# Patient Record
Sex: Female | Born: 1935
Health system: Southern US, Community
[De-identification: ages and names within clinical notes are randomized; demographics above are authoritative.]

## PROBLEM LIST (undated history)

## (undated) DIAGNOSIS — M810 Age-related osteoporosis without current pathological fracture: Secondary | ICD-10-CM

## (undated) DIAGNOSIS — R55 Syncope and collapse: Secondary | ICD-10-CM

## (undated) DIAGNOSIS — K222 Esophageal obstruction: Secondary | ICD-10-CM

## (undated) DIAGNOSIS — K589 Irritable bowel syndrome without diarrhea: Secondary | ICD-10-CM

## (undated) DIAGNOSIS — K219 Gastro-esophageal reflux disease without esophagitis: Secondary | ICD-10-CM

## (undated) DIAGNOSIS — K449 Diaphragmatic hernia without obstruction or gangrene: Secondary | ICD-10-CM

## (undated) DIAGNOSIS — I10 Essential (primary) hypertension: Secondary | ICD-10-CM

## (undated) DIAGNOSIS — C4492 Squamous cell carcinoma of skin, unspecified: Secondary | ICD-10-CM

## (undated) DIAGNOSIS — H3530 Unspecified macular degeneration: Secondary | ICD-10-CM

## (undated) DIAGNOSIS — M8580 Other specified disorders of bone density and structure, unspecified site: Secondary | ICD-10-CM

## (undated) DIAGNOSIS — G901 Familial dysautonomia [Riley-Day]: Secondary | ICD-10-CM

## (undated) DIAGNOSIS — E041 Nontoxic single thyroid nodule: Secondary | ICD-10-CM

## (undated) HISTORY — DX: Nontoxic single thyroid nodule: E04.1

## (undated) HISTORY — DX: Other specified disorders of bone density and structure, unspecified site: M85.80

## (undated) HISTORY — DX: Esophageal obstruction: K22.2

## (undated) HISTORY — DX: Unspecified macular degeneration: H35.30

## (undated) HISTORY — DX: Squamous cell carcinoma of skin, unspecified: C44.92

## (undated) HISTORY — DX: Gastro-esophageal reflux disease without esophagitis: K21.9

## (undated) HISTORY — PX: OTHER SURGICAL HISTORY: SHX169

## (undated) HISTORY — PX: EYE SURGERY: SHX253

## (undated) HISTORY — PX: C-EYE SURGERY PROCEDURE: 102257504

## (undated) HISTORY — DX: Irritable bowel syndrome, unspecified: K58.9

## (undated) HISTORY — PX: ESOPHAGEAL MANOMETRY: SHX1526

## (undated) HISTORY — PX: ESOPHAGEAL DILATION: SHX303

## (undated) HISTORY — PX: ESOPHAGOGASTRODUODENOSCOPY: SHX1529

## (undated) HISTORY — DX: Syncope and collapse: R55

## (undated) HISTORY — DX: Diaphragmatic hernia without obstruction or gangrene: K44.9

## (undated) HISTORY — PX: NISSEN FUNDOPLICATION: SHX2091

## (undated) HISTORY — PX: TOTAL ABDOMINAL HYSTERECTOMY: SHX209

## (undated) HISTORY — DX: Familial dysautonomia (riley-day): G90.1

---

## 1997-12-02 ENCOUNTER — Ambulatory Visit (HOSPITAL_COMMUNITY): Admission: RE | Admit: 1997-12-02 | Discharge: 1997-12-02 | Payer: Self-pay | Admitting: Cardiology

## 1998-10-06 ENCOUNTER — Other Ambulatory Visit: Admission: RE | Admit: 1998-10-06 | Discharge: 1998-10-06 | Payer: Self-pay | Admitting: *Deleted

## 1998-11-06 ENCOUNTER — Encounter: Payer: Self-pay | Admitting: Neurosurgery

## 1998-11-06 ENCOUNTER — Ambulatory Visit (HOSPITAL_COMMUNITY): Admission: RE | Admit: 1998-11-06 | Discharge: 1998-11-06 | Payer: Self-pay | Admitting: Neurosurgery

## 1998-12-08 ENCOUNTER — Ambulatory Visit (HOSPITAL_COMMUNITY): Admission: RE | Admit: 1998-12-08 | Discharge: 1998-12-08 | Payer: Self-pay | Admitting: *Deleted

## 1999-06-21 HISTORY — PX: CATARACT EXTRACTION: SUR2

## 1999-12-16 ENCOUNTER — Ambulatory Visit (HOSPITAL_COMMUNITY): Admission: RE | Admit: 1999-12-16 | Discharge: 1999-12-16 | Payer: Self-pay | Admitting: *Deleted

## 1999-12-16 ENCOUNTER — Encounter: Payer: Self-pay | Admitting: *Deleted

## 2000-07-14 ENCOUNTER — Ambulatory Visit (HOSPITAL_COMMUNITY): Admission: RE | Admit: 2000-07-14 | Discharge: 2000-07-14 | Payer: Self-pay | Admitting: Gastroenterology

## 2000-07-14 ENCOUNTER — Encounter (INDEPENDENT_AMBULATORY_CARE_PROVIDER_SITE_OTHER): Payer: Self-pay | Admitting: Specialist

## 2000-11-07 ENCOUNTER — Other Ambulatory Visit: Admission: RE | Admit: 2000-11-07 | Discharge: 2000-11-07 | Payer: Self-pay | Admitting: *Deleted

## 2000-11-10 ENCOUNTER — Encounter: Admission: RE | Admit: 2000-11-10 | Discharge: 2000-11-10 | Payer: Self-pay | Admitting: Internal Medicine

## 2000-11-10 ENCOUNTER — Encounter: Payer: Self-pay | Admitting: Internal Medicine

## 2001-06-11 ENCOUNTER — Emergency Department (HOSPITAL_COMMUNITY): Admission: EM | Admit: 2001-06-11 | Discharge: 2001-06-11 | Payer: Self-pay | Admitting: Emergency Medicine

## 2001-06-25 ENCOUNTER — Ambulatory Visit (HOSPITAL_COMMUNITY): Admission: RE | Admit: 2001-06-25 | Discharge: 2001-06-25 | Payer: Self-pay | Admitting: Gastroenterology

## 2001-07-03 ENCOUNTER — Emergency Department (HOSPITAL_COMMUNITY): Admission: EM | Admit: 2001-07-03 | Discharge: 2001-07-04 | Payer: Self-pay | Admitting: Emergency Medicine

## 2001-07-13 ENCOUNTER — Ambulatory Visit: Admission: RE | Admit: 2001-07-13 | Discharge: 2001-07-13 | Payer: Self-pay | Admitting: Critical Care Medicine

## 2001-07-19 ENCOUNTER — Encounter: Payer: Self-pay | Admitting: Critical Care Medicine

## 2001-07-19 ENCOUNTER — Ambulatory Visit (HOSPITAL_COMMUNITY): Admission: RE | Admit: 2001-07-19 | Discharge: 2001-07-19 | Payer: Self-pay | Admitting: Critical Care Medicine

## 2001-10-19 ENCOUNTER — Ambulatory Visit (HOSPITAL_COMMUNITY): Admission: RE | Admit: 2001-10-19 | Discharge: 2001-10-19 | Payer: Self-pay | Admitting: Internal Medicine

## 2001-10-19 ENCOUNTER — Encounter: Payer: Self-pay | Admitting: Internal Medicine

## 2001-11-21 ENCOUNTER — Encounter (INDEPENDENT_AMBULATORY_CARE_PROVIDER_SITE_OTHER): Payer: Self-pay | Admitting: *Deleted

## 2001-11-21 ENCOUNTER — Ambulatory Visit (HOSPITAL_COMMUNITY): Admission: RE | Admit: 2001-11-21 | Discharge: 2001-11-21 | Payer: Self-pay | Admitting: Internal Medicine

## 2001-11-21 ENCOUNTER — Encounter: Payer: Self-pay | Admitting: Internal Medicine

## 2002-04-10 ENCOUNTER — Other Ambulatory Visit: Admission: RE | Admit: 2002-04-10 | Discharge: 2002-04-10 | Payer: Self-pay | Admitting: Obstetrics and Gynecology

## 2002-06-21 ENCOUNTER — Encounter: Payer: Self-pay | Admitting: Internal Medicine

## 2002-06-21 ENCOUNTER — Ambulatory Visit (HOSPITAL_COMMUNITY): Admission: RE | Admit: 2002-06-21 | Discharge: 2002-06-21 | Payer: Self-pay | Admitting: Internal Medicine

## 2002-10-04 ENCOUNTER — Encounter: Payer: Self-pay | Admitting: *Deleted

## 2002-10-04 ENCOUNTER — Inpatient Hospital Stay (HOSPITAL_COMMUNITY): Admission: EM | Admit: 2002-10-04 | Discharge: 2002-10-06 | Payer: Self-pay | Admitting: Emergency Medicine

## 2002-10-05 ENCOUNTER — Encounter: Payer: Self-pay | Admitting: Emergency Medicine

## 2004-04-19 ENCOUNTER — Ambulatory Visit (HOSPITAL_COMMUNITY): Admission: RE | Admit: 2004-04-19 | Discharge: 2004-04-19 | Payer: Self-pay | Admitting: Gastroenterology

## 2004-04-26 ENCOUNTER — Ambulatory Visit: Payer: Self-pay | Admitting: Critical Care Medicine

## 2004-05-06 ENCOUNTER — Ambulatory Visit (HOSPITAL_COMMUNITY): Admission: RE | Admit: 2004-05-06 | Discharge: 2004-05-06 | Payer: Self-pay | Admitting: Gastroenterology

## 2004-05-10 ENCOUNTER — Ambulatory Visit: Payer: Self-pay | Admitting: Critical Care Medicine

## 2004-05-18 ENCOUNTER — Ambulatory Visit (HOSPITAL_COMMUNITY): Admission: RE | Admit: 2004-05-18 | Discharge: 2004-05-18 | Payer: Self-pay | Admitting: Gastroenterology

## 2004-06-22 ENCOUNTER — Inpatient Hospital Stay (HOSPITAL_COMMUNITY): Admission: RE | Admit: 2004-06-22 | Discharge: 2004-06-25 | Payer: Self-pay | Admitting: Surgery

## 2005-01-25 ENCOUNTER — Other Ambulatory Visit: Admission: RE | Admit: 2005-01-25 | Discharge: 2005-01-25 | Payer: Self-pay | Admitting: Obstetrics and Gynecology

## 2005-09-30 ENCOUNTER — Ambulatory Visit: Payer: Self-pay | Admitting: Cardiology

## 2005-10-19 ENCOUNTER — Encounter: Payer: Self-pay | Admitting: Internal Medicine

## 2005-10-19 ENCOUNTER — Ambulatory Visit: Payer: Self-pay

## 2006-07-19 ENCOUNTER — Ambulatory Visit: Payer: Self-pay | Admitting: Cardiology

## 2006-10-12 ENCOUNTER — Ambulatory Visit: Payer: Self-pay | Admitting: Cardiology

## 2006-10-19 ENCOUNTER — Ambulatory Visit (HOSPITAL_COMMUNITY): Admission: RE | Admit: 2006-10-19 | Discharge: 2006-10-19 | Payer: Self-pay | Admitting: Gastroenterology

## 2007-06-21 HISTORY — PX: COLONOSCOPY: SHX174

## 2007-08-05 ENCOUNTER — Inpatient Hospital Stay (HOSPITAL_COMMUNITY): Admission: EM | Admit: 2007-08-05 | Discharge: 2007-08-09 | Payer: Self-pay | Admitting: Emergency Medicine

## 2007-08-08 ENCOUNTER — Encounter (INDEPENDENT_AMBULATORY_CARE_PROVIDER_SITE_OTHER): Payer: Self-pay | Admitting: Gastroenterology

## 2007-08-22 ENCOUNTER — Ambulatory Visit (HOSPITAL_COMMUNITY): Admission: RE | Admit: 2007-08-22 | Discharge: 2007-08-23 | Payer: Self-pay | Admitting: Surgery

## 2008-01-28 ENCOUNTER — Ambulatory Visit: Payer: Self-pay | Admitting: Cardiology

## 2009-01-09 ENCOUNTER — Encounter (INDEPENDENT_AMBULATORY_CARE_PROVIDER_SITE_OTHER): Payer: Self-pay | Admitting: *Deleted

## 2009-04-20 ENCOUNTER — Telehealth: Payer: Self-pay | Admitting: Cardiology

## 2009-06-29 ENCOUNTER — Encounter: Payer: Self-pay | Admitting: Internal Medicine

## 2009-07-10 ENCOUNTER — Ambulatory Visit: Payer: Self-pay | Admitting: Internal Medicine

## 2009-07-10 DIAGNOSIS — Q078 Other specified congenital malformations of nervous system: Secondary | ICD-10-CM | POA: Insufficient documentation

## 2009-07-10 DIAGNOSIS — R079 Chest pain, unspecified: Secondary | ICD-10-CM | POA: Insufficient documentation

## 2009-07-10 DIAGNOSIS — R55 Syncope and collapse: Secondary | ICD-10-CM

## 2009-07-10 DIAGNOSIS — R002 Palpitations: Secondary | ICD-10-CM

## 2009-07-10 HISTORY — DX: Chest pain, unspecified: R07.9

## 2009-07-10 HISTORY — DX: Palpitations: R00.2

## 2009-07-10 HISTORY — DX: Syncope and collapse: R55

## 2009-07-22 ENCOUNTER — Telehealth (INDEPENDENT_AMBULATORY_CARE_PROVIDER_SITE_OTHER): Payer: Self-pay

## 2009-07-23 ENCOUNTER — Ambulatory Visit: Payer: Self-pay

## 2009-07-23 ENCOUNTER — Encounter (HOSPITAL_COMMUNITY): Admission: RE | Admit: 2009-07-23 | Discharge: 2009-09-24 | Payer: Self-pay | Admitting: Internal Medicine

## 2009-07-23 ENCOUNTER — Ambulatory Visit: Payer: Self-pay | Admitting: Internal Medicine

## 2009-07-29 ENCOUNTER — Telehealth: Payer: Self-pay | Admitting: Internal Medicine

## 2009-08-26 ENCOUNTER — Telehealth (INDEPENDENT_AMBULATORY_CARE_PROVIDER_SITE_OTHER): Payer: Self-pay | Admitting: *Deleted

## 2009-11-19 ENCOUNTER — Ambulatory Visit: Payer: Self-pay | Admitting: Internal Medicine

## 2010-04-01 ENCOUNTER — Encounter: Admission: RE | Admit: 2010-04-01 | Discharge: 2010-04-01 | Payer: Self-pay | Admitting: Otolaryngology

## 2010-07-22 NOTE — Progress Notes (Signed)
Summary:  Nuc Stress Results   Phone Note Outgoing Call   Call placed by: Duncan Dull, RN, BSN,  August 26, 2009 5:09 PM Call placed to: Patient Summary of Call: No answer, No machine Called to discuss Nuc Stress Results  Initial call taken by: Duncan Dull, RN, BSN,  August 26, 2009 5:10 PM  Follow-up for Phone Call        Called patient and left message on machine  Duncan Dull, RN, BSN  August 27, 2009 9:37 AM   Additional Follow-up for Phone Call Additional follow up Details #1::        Pt aware of Nuc Stress results Additional Follow-up by: Duncan Dull, RN, BSN,  August 27, 2009 11:44 AM

## 2010-07-22 NOTE — Progress Notes (Signed)
Summary:  test results/procedure   Phone Note Call from Patient Call back at Home Phone [phone]   Caller: Patient Reason for Call: Talk to Nurse, Lab or Test Results Summary of Call: request results of stress test..... having a spot on hand removed, does she need to have antibiotics before? please call before 9:30 or after 2:30 Initial call taken by: Migdalia Dk,  July 29, 2009 8:27 AM  Follow-up for Phone Call        Called patient and left message on machine  Duncan Dull, RN, BSN  July 29, 2009 11:29 AM   Pt wanted to know if she needed SBE (per her card) to have a spot removed from her hand. I advised her that does not apply to the hands. She understands.  Follow-up by: Duncan Dull, RN, BSN,  July 29, 2009 2:48 PM

## 2010-07-22 NOTE — Assessment & Plan Note (Signed)
Summary: Cardiology Nuclear Study  Nuclear Med Background Indications for Stress Test: Evaluation for Ischemia     Symptoms: Chest Pressure, Diaphoresis, Light-Headedness, Palpitations, Rapid HR, Syncope  Symptoms Comments: Chest Heaviness   Nuclear Pre-Procedure Caffeine/Decaff Intake: None NPO After: 6:30 PM Lungs: clear IV 0.9% NS with Angio Cath: 22g     IV Site: (R) AC IV Started by: Irean Hong RN Chest Size (in) 32     Cup Size C     Height (in): 65 Weight (lb): 124 BMI: 20.71  Nuclear Med Study 1 or 2 day study:  1 day     Stress Test Type:  Eugenie Birks Reading MD:  Arvilla Meres, MD     Referring MD:  S.Klein Resting Radionuclide:  Technetium 80m Tetrofosmin     Resting Radionuclide Dose:  11.0 mCi  Stress Radionuclide:  Technetium 62m Tetrofosmin     Stress Radionuclide Dose:  33.0 mCi   Stress Protocol   Lexiscan: 0.4 mg   Stress Test Technologist:  Milana Na EMT-P     Nuclear Technologist:  Burna Mortimer Deal RT-N  Rest Procedure  Myocardial perfusion imaging was performed at rest 45 minutes following the intravenous administration of Myoview Technetium 56m Tetrofosmin.  Stress Procedure  The patient received IV Lexiscan 0.4 mg over 15-seconds.  Myoview injected at 30-seconds.  There were no significant changes with infusion.  Quantitative spect images were obtained after a 45 minute delay.  QPS Raw Data Images:  Normal; no motion artifact; normal heart/lung ratio. Stress Images:  There is normal uptake in all areas. Rest Images:  Normal homogeneous uptake in all areas of the myocardium. Subtraction (SDS):  Normal Transient Ischemic Dilatation:  1.06  (Normal <1.22)  Lung/Heart Ratio:  .29  (Normal <0.45)  Quantitative Gated Spect Images QGS EDV:  50 ml QGS ESV:  11 ml QGS EF:  77 % QGS cine images:  Normal  Findings Normal nuclear study      Overall Impression  Exercise Capacity: Lexiscan study with no exercise. ECG Impression: Baseline:  NSR; No significant ST segment change with Lexiscan. Overall Impression: Normal stress nuclear study.  Appended Document: Cardiology Nuclear Study Unable to reach pt.Marland KitchenMarland KitchenMarland KitchenNo answer, No machine  Appended Document: Cardiology Nuclear Study Pt aware of results

## 2010-07-22 NOTE — Assessment & Plan Note (Signed)
Summary: nep for palpitations  Medications Added ATENOLOL 25 MG TABS (ATENOLOL) take 1/2 tablet once daily EVISTA 60 MG TABS (RALOXIFENE HCL) take one tablet once daily AMBIEN 10 MG TABS (ZOLPIDEM TARTRATE) as needed      Allergies Added: ! ASA ! LIDOCAINE ! PCN ! SULFA ! BIAXIN ! PRILOSEC ! EPINEPHRINE ! TETRACYCLINE ! * GLYCOL  Referring Provider:  Perini Primary Provider:  Denyse Dago, MD  CC:  nep for palpitations.  She recently had an episode of syncope.  She was in church and got very hot and passed out.Marland Kitchen  History of Present Illness: Michelle Keith is seen at the request of Dr. Waynard Edwards because of a recent episode of syncope and a prior history of palpitations. the patient has a lifelong history of orthostatic intolerance manifested primarily as nausea and some lightheadedness. It was notably worse following exercise. She has had no syncope however until more recently.  5 years ago the patient underwent Nissen fundoplication. Since that time she has had problems with chronic diarrhea that has been variably voluminous. Her lightheadedness has been worse since then. She has had 2 episodes of syncope in the last 2 years, the first occurred when she had a significant associated gastroenteritis. The more recent one occurred 10 days ago while at church. She became nauseated diaphoretic subtotally extremely pale. She got up to leave the church and then describing unable to see and then collapsed. When she finally awakened she was able with some support to get up and move around and she had significant residual fatigue that lasted initially for hours but then persisted mildly bfor about 10 days.  Her diet is saltdeplete. She is quite fluid replete however and she is aware that when she gets sick that she needs to do things like drink Gatorade.  Surprisingly she does not have shower  orthostatic intolerance or heat intolerance . When she was seen earlier this week   Dr.Perini  noted significant  increase in her heart rate from lying to standing going  from 80-120. He kindly sent as those records  she also has a history of palpitations. . The first is a racing that occurs primarily when she is sick. It is somewhat provoked by being upright. The heart rate with ease has been recorded in the 1:15 to 125 range by her and her husband.  The other episodes are shorter and are described by her husband as a normal beat normal beat normal beats followed by 2-5 rapid beats.  with her episodes of lightheadedness, the dose was decreaesed with worsening of her palpitations.  They are not positional      Current Medications (verified): 1)  Atenolol 25 Mg Tabs (Atenolol) .... Take 1/2 Tablet Once Daily 2)  Vitamin D 1000 Unit Tabs (Cholecalciferol) .... Take One Tablet Once Daily 3)  Evista 60 Mg Tabs (Raloxifene Hcl) .... Take One Tablet Once Daily 4)  Ambien 10 Mg Tabs (Zolpidem Tartrate) .... As Needed  Allergies (verified): 1)  ! Asa 2)  ! Lidocaine 3)  ! Pcn 4)  ! Sulfa 5)  ! Biaxin 6)  ! Prilosec 7)  ! Epinephrine 8)  ! Tetracycline 9)  ! * Glycol  Past History:  Family History: Last updated: 07/09/2009 Significant for multiple members having esophageal problems with motility  Social History: Last updated: 07/09/2009 She is a previous substitute Sales executive Married  Tobacco Use - No.  Alcohol Use - no Drug Use - no  Past Medical History:  Vasovagal syncope-hx  Gastroesophageal reflux, stable  Palpitations  Anxiety  Past Surgical History: Status post Nissen fundoplasty in January of 2006.   Review of Systems       full review of systems was negative apart from a history of present illness and past medical history. \par Vital Signs:  Patient profile:   77 year old female Height:      65 inches Weight:      127 pounds BMI:     21.21 Pulse rate:   86 / minute Pulse (ortho):   99 / minute Pulse rhythm:   regular BP sitting:   126 / 80  (left arm) BP  standing:   136 / 79 Cuff size:   regular  Vitals Entered By: Judithe Modest CMA (July 10, 2009 11:11 AM)  Serial Vital Signs/Assessments:  Time      Position  BP       Pulse  Resp  Temp     By 12:36 PM  Lying RA  138/76   79                    Judithe Modest CMA 12:36 PM  Sitting   133/77   86                    Judithe Modest CMA 12:36 PM  Standing  136/79   99                    Judithe Modest CMA  Comments: 12:36 PM 2 minutes-126/85 HR 100 3 minutes-116/77 HR 110 Pts hands, ankles and feet began to turn purple and red and the patient stated her hands felt 'full or swollen'   She had no ches discomfort or dizziness. By: Judithe Modest CMA    Physical Exam  General:  Alert and oriented elderly Caucasian female appearing her stated age of 15 in no acute distress. HEENT  normal . Neck veins were flat; carotids brisk and full without bruits. No lymphadenopathy. Back without kyphosis. Lungs clear. Heart sounds regular without murmurs or gallops. PMI nondisplaced. Abdomen soft with active bowel sounds without midline pulsation or hepatomegaly. Femoral pulses and distal pulses intact. Extremities were without clubbing cyanosis or edemaSkin warm and dry. Neurological exam grossly normal affect was normal   EKG  Procedure date:  07/10/2009  Findings:      sinus rhythm at 86 Intervals 0.14/0.08/0.37 Axis XV  Impression & Recommendations:  Problem # 1:  SYNCOPE (ICD-780.2) this is a manifestation of neurally mediated syncope. The precise trigger is not clear. She has long-standing symptoms consistent with dysautonomia. The Nissen fundoplication in some patients is associated with aggravation or precipitation ofdysautonomic events in this case with her problems with diarrhea, may be contributing.  In any case volume expansion is the primary goal of therapy. Vital signs today and before are consistent with orthostatic intolerance of various degrees. She has some degree of resting  supine hypertension today so we will have to be careful of the impact of salt. Nonmedicinal therapies and/or Mestinon might be important to consider if her supine hypertension worsens with volume and salt repletion.  The other issue is whether chest discomfort is a reflection of inferior ischemia which may by itself trigger a Bezoald-jarisch reflex. As such though I think it is unlikely given the paucity of cardiac risk factors, at this stage it becomes important to exclude ischemia and will undertake a Myoview scan to do that. The patient  is quite averse to walk on her treadmill. Her updated medication list for this problem includes:    Atenolol 25 Mg Tabs (Atenolol) .Marland Kitchen... Take 1/2 tablet once daily  Problem # 2:  CHEST PAIN, EXERTIONAL (ICD-786.50) as above Her updated medication list for this problem includes:    Atenolol 25 Mg Tabs (Atenolol) .Marland Kitchen... Take 1/2 tablet once daily  Problem # 3:  PALPITATIONS (ICD-785.1) I not sure what underlies or palpitations. I suspect short runs are atrial tachycardia. This could be documented work we can continue empiric therapy which has been successful thus far. I would favor the latter. However, the atenolol may aggravate her orthostatic hypotension and as such we will have to be careful that we can treat that other ways i.e. salt and water repletion. In the event that we cannot non blood pressure lowering medications for example an antiarrhythmic drug or digoxin might be more effective here; or maybe just better tolerated.  The more rapid rates I suspect are orthostatic sinus tachycardia and hopefully they will resolve as we work on the aforementioned problems. Her updated medication list for this problem includes:    Atenolol 25 Mg Tabs (Atenolol) .Marland Kitchen... Take 1/2 tablet once daily  Orders: EKG w/ Interpretation (93000) Nuclear Stress Test (Nuc Stress Test)  Patient Instructions: 1)  Your physician has requested that you have an adenosine myoview.  For  further information please visit [email or web address]  Please follow instruction sheet, as given.  Appended Document: nep for palpitations F/U IN 3-4 MONTHS

## 2010-07-22 NOTE — Assessment & Plan Note (Signed)
Summary: pc2      Allergies Added:   Referring Provider:  Perini Primary Provider:  Denyse Dago, MD  CC:  pt reports several more episodes of sweating and feeling dizzy.  Pt not feeling well today.  She describes it as her "thermostat" going haywire.  She gets very hot and feels horrible.  Marland Kitchen  History of Present Illness: Michelle Keith is seen in [place] for orthoastatic and sysautonomic syncope with palpitations assoc wtih some atrial rrrhtymias    DShe had a recent myoview to exclude a Bezold Jarisch reflex as a trigger,  this ws normal  She ahs had two depisodes of dizziness one of which occured on mohters day at an [employer] following a lunch and shopping.  It was associated with heat and diaphoresis and resolved with time   she noted her HR today wsa 120 at home with BP 107  She has been taking more salt and water andher symptoms overlall have been much improved   Current Medications (verified): 1)  Vitamin D 1000 Unit Tabs (Cholecalciferol) .... Take One Tablet Once Daily 2)  Evista 60 Mg Tabs (Raloxifene Hcl) .... Take One Tablet Once Daily 3)  Ambien 10 Mg Tabs (Zolpidem Tartrate) .... As Needed  Allergies (verified): 1)  ! Asa 2)  ! Lidocaine 3)  ! Pcn 4)  ! Sulfa 5)  ! Biaxin 6)  ! Prilosec 7)  ! Epinephrine 8)  ! Tetracycline 9)  ! * Glycol  Past History:  Past Medical History: Last updated: 07/10/2009  Vasovagal syncope-hx  Gastroesophageal reflux, stable  Palpitations  Anxiety  Past Surgical History: Last updated: 07/10/2009 Status post Nissen fundoplasty in January of 2006.   Family History: Last updated: 07/09/2009 Significant for multiple members having esophageal problems with motility  Social History: Last updated: 07/09/2009 She is a previous substitute Sales executive Married  Tobacco Use - No.  Alcohol Use - no Drug Use - no  Vital Signs:  Patient profile:   77 year old female Height:      65  inches Weight:      125 pounds BMI:     20.88 Pulse rhythm:   regular Cuff size:   regular  Vitals Entered By: Judithe Modest CMA (November 19, 2009 11:26 AM)  Physical Exam  General:  The patient was alert and oriented in no acute distress. HEENT Normal.  Neck veins were flat, carotids were brisk.  Lungs were clear.  Heart sounds were regular without murmurs or gallops.  Abdomen was soft with active bowel sounds. There is no clubbing cyanosis or edema. Skin Warm and dry    Impression & Recommendations:  Problem # 1:  DYSAUTONOMIA (ICD-742.8) i htink this is mostlydusautomoia as when she felelslike this her HR is almost always fast.  she is adivised to keep fludi resusicitatied  we spent more thatn 50% of our 35 min  visit discussing ht ephysiologyt and the therapies of her dysautonomia  Patient Instructions: 1)  Your physician recommends that you schedule a follow-up appointment in: as needed.

## 2010-07-22 NOTE — Progress Notes (Signed)
Summary: Nuc. Pre-Procedure  Phone Note Outgoing Call Call back at [facility] Phone [phone]   Call placed by: Irean Hong, RN,  July 22, 2009 2:46 PM Summary of Call: Reviewed information on Myoview Information Sheet (see scanned document for further details).  Spoke with patient.     Nuclear Med Background Indications for Stress Test: Evaluation for Ischemia     Symptoms: Diaphoresis, Light-Headedness, Palpitations, Rapid HR, Syncope    Nuclear Pre-Procedure Height (in): 65

## 2010-11-02 NOTE — Op Note (Signed)
NAME:  Michelle Keith, KOOP NO.:  [id number]   MEDICAL RECORD NO.:  [id number]          PATIENT TYPE:  INP   LOCATION:  1506                         FACILITY:  [facility]   PHYSICIAN:  James L. Malon Kindle., M.D.DATE OF BIRTH:  08-Aug-1931   DATE OF PROCEDURE:  08/08/2007  DATE OF DISCHARGE:                               OPERATIVE REPORT   PROCEDURE:  Colonoscopy and biopsy.   MEDICATIONS:  Fentanyl 100 mcg and Versed 7 mg IV.   INDICATION:  A patient with diarrhea, has had very explosive episodes,  has had previous extensive hiatal hernia repair and Nissen.  X-ray  suggesting possible small bowel obstruction.  This is done to rule out  other causes for diarrhea and look for any signs of colonic obstruction.   DESCRIPTION OF PROCEDURE:  The procedure had been explained to the  patient and consent obtained.  She had been prepped with magnesium  citrate and refused enemas or Golytely.  The Pentax pediatric scope was  used. The scope was inserted. The prep was excellent.  She had a  somewhat tortuous sigmoid.  After we passed through, we advanced rapidly  to the cecum.  The ileocecal valve and appendiceal orifice was seen.  The scope was withdrawn.  The cecum, ascending, transverse, descending,  and sigmoid colon were seen.  No diverticulosis, polyps, cancers, or any  signs of obstruction, narrowing, etc.  The rectum was normal, as well.  Multiple random biopsies were taken throughout.  The scope was  withdrawn.  The patient tolerated the procedure well.   ASSESSMENT:  Diarrhea and abnormal CT with no obvious abnormalities on  colonoscopy, biopsies taken to rule out microscopic colitis.   PLAN:  Will check path and discuss further with Dr. Daphine Deutscher.           ______________________________  Llana Aliment. Malon Kindle., M.D.     Waldron Session  D:  08/08/2007  T:  08/08/2007  Job:  [id number]   cc:   [place] Daphine Deutscher, MD  [address], [address]  [place]  [place] [address]   Redge Gainer. Perini, M.D.  Fax: [phone]

## 2010-11-02 NOTE — Consult Note (Signed)
NAME:  Michelle Keith, Michelle Keith NO.:  [id number]   MEDICAL RECORD NO.:  [id number]          PATIENT TYPE:  INP   LOCATION:  1343                         FACILITY:  [facility]   PHYSICIAN:  Ardeth Sportsman, MD     DATE OF BIRTH:  1932/01/16   DATE OF CONSULTATION:  DATE OF DISCHARGE:                                 CONSULTATION   PRIMARY CARE PHYSICIAN:  Dr. Rodrigo Ran.   REQUESTING PHYSICIAN:  Dr. Larina Earthly.   GASTROENTEROLOGIST:  Bernette Redbird with [facility].   SURGEON:  Estelle Grumbles for [facility].   REASON FOR CONSULT:  Abdominal pain and bloating status post  laparoscopic and paraesophageal hernia repair, question bowel  obstruction.   HISTORY OF PRESENT ILLNESS:  Michelle Keith is a 77 year old female who  underwent laparoscopic paraesophageal hernia repair on June 22, 2004.  Since that time, she notes that she gets intermittent episodes of severe  diarrhea.  She thinks it happens 3 to 10 times a month.  She can have up  to 10 bowel movements.  It often seems to be related to after eating but  sometimes it is not.  She cannot recall which foods make it better or  worse.  She claims that she has been evaluated by Dr. Matthias Hughs with the  [facility] since then, as well as Dr. Daphine Deutscher and actually went to [facility]  although she cannot recall who it was (? gastroenterologist, ? surgeon).  She says she claims that everything has been done and nothing has  worked.   She comes in today because she had some worsening abdominal pain and  distention yesterday and actually felt a little lightheaded.  She went  to the emergency room and actually passed out.  She had an evaluation  that was concerning for a possible small bowel obstruction and therefore  was admitted by Dr. Felipa Eth.  Surgical consultation was requested given the  obstructive symptoms and history of prior surgery.   She denies any sick contacts or travel history.  She claims she has  tried fiber without any  improvement in the diarrhea.  She claims that  she tried Questran without any improvement.  She cannot recall any other  interventions.  She recalls at 1 point talking about being told that she  needed to have reoperation.  She recalls being told at one point  somebody was going to try Botox on her.  It is not coming together.   She claims she has never had any problems with any bowel habits  beforehand but a note from Dr. Matthias Hughs notes that she had chronic  intermittent diarrhea suggestive of irritable bowel syndrome back in  2002.  She flatly denies this and at that point she had a normal  colonoscopy.  A recommendation was made for a flex sig in 5 years and  she has not had any evaluation since that time.  She denies any  hematochezia or melena.  Again, no sick contacts or travel history.   PAST MEDICAL HISTORY:  1. Paraesophageal hernia status post repair.  2. Intermittent  chest discomfort, evaluated by Juanito Doom earlier this      year.  3. Small perimembranous cardiac septal defect, asymptomatic.  4. Intermittent palpitations, controlled with beta-blocker.  5. Intermittent chest pain with negative stress Myoview in the past.  6. Dyslipidemia.  7. She has been followed by Dr. Shan Levans for vocal cord      dysfunction, hoarseness in the past.  8. Distant history of breast cancer.   PAST SURGICAL HISTORY:  1. She had abdominal hysterectomy in the past.  2. She had laparoscopic x3 paraesophageal hernia repair with primary      reconstruction or pledgets and Nissen fundoplication with a #50-      bougie by [facility] on June 22, 2004.   MEDICATIONS:  She claims she takes:  1. Protonix.  2. As well as calcium.  3. Atenolol.   ALLERGIES:  1. ASPIRIN.  2. LIDOCAINE.   SOCIAL HISTORY:  She is married and her husband is here at the bedside.  [place]  Works at home.  No tobacco, alcohol,  or drug use.   FAMILY HISTORY:  Noncontributory for  any major bowel disorders that she  can recall or any early cardiopulmonary disease.   REVIEW OF SYSTEMS:  As noted per HPI.  CONSTITUTIONAL:  No fevers,  chills, or sweats.  No recent weight gain or weight loss.  EYES/ENT:  Negative.  CARDIAC/RESPIRATORY:  She has good physical activity and exercise  tolerance, otherwise completely negative.  GI:  As noted above.  She is  able to belch but no emesis.  No history of dry retching.  No dysphagia  to solids or liquids.  She complained of some mild intermittent reflux  which is controlled on PPI so that it has been mild and she cannot  exactly quantify.  No significant early satiety but occasional bloating.  GU/GYN/HEPATIC/RENAL/ENDOCRINE:  Otherwise negative.  MUSCULOSKELETAL/NEUROLOGICAL/DERMATOLOGIC/LYMPH/ALLERGIC:  Otherwise  negative.   VITAL SIGNS:  Her temperature is 93.  Pulse 82.  Respirations 18.  Blood  pressure 120/70.  She is 98% on room air.  GENERAL:  She is a well-developed, well-nourished female sitting in bed  comfortable in no acute distress.  She is not toxic.  PSYCH:  She is pleasant and interactive.  No strong evidence of any  dementia, delirium, psychosis, or paranoia.  EYES:  Pupils equal, round, and reactive to light.  Extraocular  movements are intact.  Her sclerae are nonicteric or injected.  NEUROLOGICAL:  Cranial nerves II-XII are intact.  Hand grip is 5/5,  equal and symmetrical.  No resting or intention tremors.  NECK:  Supple.  No masses.  Trachea is midline.  HEENT:  She is normocephalic.  Mucous membranes are dry but nasopharynx  and oropharynx are clear.  No facial asymmetry.  CHEST:  Clear to auscultation bilaterally.  No wheezes, rales, or  rhonchi.  HEART:  Regular rate and rhythm.  No murmurs, gallops, or rubs.  ABDOMEN:  Soft, nontender, and nondistended.  No incisional or inguinal  hernias.  No incisional or abdominal hernias.  GENITAL:  Normal external female genitalia with no inguinal  hernias.  RECTAL:  Deferred per patient request.  EXTREMITIES:  No clubbing, cyanosis, or edema.  MUSCULOSKELETAL:  Normal range of motion in the shoulders, elbows,  wrists, as well as hips, knees, and ankles and cervical, thoracolumbar  sacral spine.  BACK:  No pain on palpation of her spine or costophrenic angles.  LYMPH:  No head, neck,  axillary, or groin lymphadenopathy.  BREASTS:  Deferred per patient request.   STUDIES:  White count is 9.9, the normal range, hemoglobin 13.9.  Her  electrolytes are normal except for BUN of 23, slightly elevated.  She  has a urinalysis negative.  Upper GI in May 2008 shows no evidence of  any reflux or any other abnormalities.  CT scan shows that there is no  evidence of any reherniation.  She does have some mildly dilated loops  of small bowel in the left upper quadrant but no definite transition  zone to my view.  She has gas in her colon or just small bowel slightly  decompressed.   ASSESSMENT AND PLAN:  1. Ileus versus partial small bowel obstruction, possibly secondary      adhesions from prior surgery.  2. Keep n.p.o..  3. Increase IV fluid boluses especially with passing out with probable      orthostatics.  4. Nasogastric tube if she has worsening distention or discomfort.  5. Intermittent diarrhea.  I have documentation saying she has had      chronic intermittent diarrhea prior to surgery although she denies      it right now.  I called Dr. Herbert Moors on call for [facility]      [facility] who asked Dr. Matthias Hughs or one of his associates to      see tomorrow and look at prior records to see what workup has been      done and how long this has been going on.  Certainly, her GI      function is not adequately controlled as it is and it would be nice      to figure out what has been done to sort out what is going on with      that.  6. If her symptoms worsen, a diagnostic laparoscopy might be of      benefit but I would hold off right  now since she is not toxic, her      abdomen is flat, and she is actually hungry.  7. Defer rest of management to the medicine team as far as her      palpitations, on beta-blocker.  8. She continues on proton pump inhibitors.  I do not know if she      really needs this anymore.  I would go so far as to do a 24-hour pH      probe or a 48-hour Bravo study off proton pump inhibitors to see if      she has true reflux.  If she does not, she needs to get off it.  9. Consider repeat colonoscopy with [place] and she is overdue for this      anyway with her history of intermittent diarrhea to make sure we      are not missing anything.  I will defer back to gastroenterology on      this.      Ardeth Sportsman, MD  Electronically Signed     SCG/MEDQ  D:  08/05/2007  T:  08/06/2007  Job:  [id number]

## 2010-11-02 NOTE — Assessment & Plan Note (Signed)
[facility]                            CARDIOLOGY OFFICE NOTE   Michelle Keith, Michelle Keith                        MRN:          [id number]  DATE:01/28/2008                            DOB:          08-18-1931    Michelle Keith returns today for followup.  She has been having some  palpitations since the return.  I saw her last on October 12, 2006, when  she was having some chest discomfort.  We had ordered an adenosine rest  stress Myoview, but she chickened out.   Other than some palpitations particularly at night, she is doing well.  She has had no presyncope or syncope.  She has had no chest discomfort.   She still on atenolol 25 mg a day.   PHYSICAL EXAMINATION:  VITAL SIGNS:  Today, her blood pressure is  112/62, her pulse is 80 and regular, and weight is 226, down 2.  GENERAL:  She looks remarkably good.  HEENT:  Unchanged.  NECK:  Carotids are full without bruits.  There is no thyroid  enlargement or tenderness.  LUNGS:  Clear to auscultation.  HEART:  Regular rate and rhythm.  No gallop.  ABDOMEN:  Soft, good bowel sounds.  EXTREMITIES:  No cyanosis, clubbing or edema.  Pulses were present.  NEURO:  Intact.   EKG shows sinus rhythm.  Normal EKG.   Michelle Keith is doing well.  I have reassured her and renewed her atenolol.  We will see her back again in a year.     Thomas C. Daleen Squibb, MD, [facility]  Electronically Signed    TCW/MedQ  DD: 01/28/2008  DT: 01/29/2008  Job #: [id number]

## 2010-11-02 NOTE — Consult Note (Signed)
NAME:  Michelle Keith, Michelle Keith NO.:  [id number]   MEDICAL RECORD NO.:  [id number]          PATIENT TYPE:  INP   LOCATION:  1506                         FACILITY:  [facility]   PHYSICIAN:  James L. Malon Kindle., M.D.DATE OF BIRTH:  07-Aug-1931   DATE OF CONSULTATION:  08/06/2007  DATE OF DISCHARGE:                                 CONSULTATION   REFERRING PHYSICIAN:  Ardeth Sportsman, MD.   HISTORY:  Ms. Stinnette is a nice 77 year old woman who has a long history of  diarrhea and abdominal pain.  These symptoms go back to an extensive  operation in 2005, that was laparoscopic Nissen with repair of the  intrathoracic stomach, by Dr. Molli Hazard B. Daphine Deutscher.  It was called a large  type 3 mixed hiatal hernia.  She had the laparoscopic takedown of an  intrathoracic stomach, repair of the diaphragm, and a Nissen  fundoplication.  Ever since that surgery and beginning approximately a  week or so afterwards, she has had episodes of pain and diarrhea.  I do  not have her office chart available at this time, but she has had an  extensive workup for this by Dr. Matthias Hughs.  She had previous endoscopies  and colonoscopies prior to that surgery in 2006.   She states that these are spells of bloating, some belching actually,  and abdominal cramping and pain; followed by explosive diarrhea with  multiple loose stools and lots of gas and then her bloating subsides.  This occurs approximately 40% of the time, 60% of the days out of the  month she has normal bowel movements once a day after eating breakfast  and pudding-like; with no associated pain or bloating.  Some some months  it is worse than others.  This has been on been on fairly consistently  for several years following her surgery.  She called, was having a  particularly bad episode of pain and bloating; came into the emergency  room.  A CT scan showed a dilated proximal small bowel, and it was felt  that she had a proximal small bowel obstruction.   She had some dilated  loops of jejunum.  She clinically feels better today without having  anything done other than IV fluids.  She has not had an NG tube.  The  question at this point is what to do further with her.  She reports that  she has not had her gallbladder tested in some time.  We think she may  have had an ultrasound, but I cannot really remember when.  I did review  E-chart, and I do not see an ultrasound.  She had a gastrograph and  swallow, an upper GI back in 2008; these did not really show anything.  The Nissen appeared to be intact.  Another upper GI in May 2008 also  looked okay.  The patient is tired of these symptoms and anxious to have  something done.   CURRENT MEDICATIONS:  1. Lovenox.  2. Ativan.  3. IV fluids.  She is NPO.   ALLERGIES:  ASPIRIN, PENICILLIN, TETRACYCLINES,  BIAXIN.  MEDICAL HISTORY:  History of extensive repair of intrathoracic stomach  in 2006 and a laparoscopic Nissen.  She has also had a hysterectomy.  She has a long history of gastroesophageal reflux disease.  She has a  history of chest pain; had a stress test in 2004 that was negative.  Apparently has been on some atenolol given her in the past by Dr. Daleen Squibb.   FAMILY HISTORY:  Noncontributory.   REVIEW OF SYSTEMS:  Primarily as noted above.   PHYSICAL EXAMINATION:  Temperature 98.4, pulse 60, blood pressure  117/63.  GENERAL:  Pleasant, oriented white female in no acute distress  EYES:  Sclerae nonicteric.  NECK:  Supple.  HEART:  Regular rate and rhythm; without murmurs, rubs or gallops.  LUNGS:  Clear.  ABDOMEN:  Soft and nondistended, with diminished bowel sounds currently.   ASSESSMENT:  EPISODIC DIARRHEA AND ABDOMINAL PAIN.  I think all of her  symptoms were probably due to partial small bowel obstruction related to  adhesions from her previous surgery.  It is possible that she could have  gallstones.  I do not see on the E-chart where this has been checked  recently;  therefore I think we definitely should check this.  My  suspicion is she will ultimately have to make a decision about whether  or not to undergo a lysis of adhesions.  I think we do need to see what  other workup has been done by Dr. Matthias Hughs via his office chart.  I will  plan on going ahead and order a gallbladder ultrasound.  I will discuss  with the surgeons and with Dr. Matthias Hughs, and check the records in the  office.           ______________________________  Llana Aliment. Malon Kindle., M.D.     Waldron Session  D:  08/06/2007  T:  08/07/2007  Job:  [id number]   cc:   Bernette Redbird, M.D.  Fax: [phone]   [place] Daphine Deutscher, MD  [address], [address]  [place]  [place] [address]

## 2010-11-02 NOTE — H&P (Signed)
NAME:  ADAMARYS, SHALL NO.:  [id number]   MEDICAL RECORD NO.:  [id number]          PATIENT TYPE:  INP   LOCATION:  1506                         FACILITY:  [facility]   PHYSICIAN:  Larina Earthly, M.D.        DATE OF BIRTH:  [birth year]   DATE OF ADMISSION:  08/04/2007  DATE OF DISCHARGE:                              HISTORY & PHYSICAL   LOCATION:  [facility], room 1343.   CHIEF COMPLAINT:  Abdominal pain with nausea and one episode of syncope.   HISTORY OF PRESENT ILLNESS:  This is a 77 year old Caucasian female who  has a past medical history significant for severe GERD, status post  Nissen fundoplasty in 2006, complicated by intermittent chronic  diarrhea, since that time.  She began suffering on August 04, 2007,  crampy abdominal pain in the epigastric area that moved over to the left  upper quadrant, associated with increased watery diarrhea, greater than  her baseline and persistent nausea and anorexia.  She presented to the  emergency room; however, in waiting to be evaluated the patient began  experiencing more nausea and went outside for some fresh air and to lie  down in her car, and this was complicated by an episode of syncope  witnessed by the daughter who was with her, who caught before she fell  down at the time.  She did not have any seizure activity, denied any  shortness of breath, chest pain, palpitations and quickly became  responsive thereafter.  Shortly thereafter, she was evaluated the  emergency room where all of her electrolytes and CBC were within normal  limits, but she continued to have the nausea and crampy abdominal pain.  CT scan was obtained which revealed a partial small-bowel obstruction,  with a possible transition point in the left upper quadrant and possibly  associated with adhesions.  She is now admitted for further management  and treatment.  She was made n.p.o. in the emergency room and given IV  fluids.   REVIEW OF  SYSTEMS:  Negative for fevers, chills, headaches, difficulty  swallowing but negative for chest pain, shortness of breath,  palpitations, now that she is on beta blockade, but positive for nausea  and diarrhea, loose stools without blood, crampy abdominal pain,  questionable dysuria and negative for new neurological or focal  musculoskeletal deficits.   PROBLEM LIST:  Severe GERD followed by Nissen fundoplasty by Dr. Wenda Low in January 2006, and now complicated by chronic diarrhea that is  intermittent.  She also has a history of hysterectomy.  She also has a  history of esophageal spasms, and she also has a history of atypical  chest pain evaluated by Dr. Juanito Doom, and with negative echocardiogram  and stress Cardiolite in the past, history of palpitations treated with  beta blockade with adequate control and suspected mild anxiety disorder.   SOCIAL HISTORY:  The patient is married x numerous decades, has no  significant history of tobacco or alcohol use.  She is a previous  substitute Sales executive.   FAMILY HISTORY:  Significant for multiple members having esophageal  problems with motility.   CURRENT MEDICATIONS:  Include Protonix 40 mg each day, atenolol 25 mg  each day.  She has medication sensitivities to aspirin, lidocaine,  penicillin, tetracycline and epinephrine.   CURRENT LABS:  Reveal white blood cell count 9.9, hemoglobin 13.7,  hematocrit 40.6%, platelet count 171, sodium 137, potassium 3.9, BUN 23,  creatinine 0.8, glucose 137.  Urinalysis unremarkable.   CT of the abdomen and pelvis as above.   PHYSICAL EXAM:  GENERAL:  We have a pleasant Caucasian female lying flat  in bed, no apparent distress, answering all questions appropriately,  alert and oriented x3.  VITAL SIGNS:  Blood pressure 128/70, heart rate 82 and regular,  respirations 18 and nonlabored, temperature 98.3 degrees Fahrenheit,  oxygen saturation 98% on room air.  HEENT:  Sclerae  anicteric.  Extraocular movements are intact.  Face is  symmetric.  There is no oropharyngeal lesions.  Tongue is midline.  NECK:  Supple.  There is no cervical lymphadenopathy.  LUNGS:  Clear to auscultation bilaterally.  CARDIOVASCULAR:  Exam reveals regular rate and rhythm without murmurs,  rubs, gallops appreciated.  ABDOMEN:  Abdominal exam reveals a soft, nontender, nondistended  abdomen.  Bowel sounds are present but hypoactive. EXTREMITIES:  Reveals  no edema.  Pedal pulses are intact.  NEUROLOGICAL:  Exam is grossly nonfocal.  There is no axillary  lymphadenopathy.   ASSESSMENT/PLAN:  1. Partial small-bowel obstruction with a history of Nissen      fundoplasty, as well as hysterectomy, now with a CT suggestive of      adhesions in the left upper quadrant, where transition point is      observed, and this is complicated by chronic episodic diarrhea.      For the time being, will give her an n.p.o. status with bowel rest,      provide IV fluids, and have the patient get out of bed with      assistance only. Her current exam is benign without any nausea or      diarrhea.  Will obtain a surgical consult at this time, given the      possibility of adhesions and surgery in 2006, and allow them to      dictate when the patient may resume a clear fluid diet and advance      as she tolerates.  2. Syncope.  Highly suspect this is secondary to a vasovagal episode,      associated with nausea.  Will monitor the patient on telemetry,      check an EKG, and of note echocardiogram and Cardiolite were      unremarkable by Dr. Juanito Doom in 2004 and 2007.  She was last      evaluated by Dr. Juanito Doom in 2008.  3. Chronic diarrhea.  Apparently, this has been evaluated by Dr.      Matthias Hughs, as well as consultants at the [facility], but no      satisfactory corrective measure has been able to be obtained, per      family report.  Certainly, the family is interested in further      evaluation,  pending the results of this hospitalization.      Larina Earthly, M.D.  Electronically Signed     RA/MEDQ  D:  08/05/2007  T:  08/06/2007  Job:  [id number]   cc:   Loraine Leriche A. Perini, M.D.  Fax: [phone]   Bernette Redbird, M.D.  Fax: [phone]   [place] Daphine Deutscher, MD  [address], [address]  [place]  [place] [address]

## 2010-11-02 NOTE — Discharge Summary (Signed)
NAME:  Michelle Keith, Michelle Keith NO.:  [id number]   MEDICAL RECORD NO.:  [id number]          PATIENT TYPE:  INP   LOCATION:  1506                         FACILITY:  [facility]   PHYSICIAN:  Mark A. Perini, M.D.   DATE OF BIRTH:  1932-04-26   DATE OF ADMISSION:  08/04/2007  DATE OF DISCHARGE:  08/09/2007                               DISCHARGE SUMMARY   DISCHARGE DIAGNOSES:  1. Partial small-bowel obstruction, clinically improved with      conservative management, possibly due to adhesions.  2. Chronic diarrhea.  3. Vasovagal syncope, no recurrence this admission.  4. Gastroesophageal reflux, stable.  5. Palpitations.  6. Anxiety.  7. Status post Nissen fundoplasty in January of 2006.  8. Remote history of esophageal spasms atypical chest pain.   PROCEDURES:  1. General surgery consultation and gastroenterology consultation.  2. On August 08, 2007, the patient underwent colonoscopy and biopsy.      There were no obvious abnormalities on colonoscopy, and biopsies      were taken to rule out microscopic colitis.  Final biopsy report      shows benign normal colonic mucosa with no significant inflammation      or other abnormality.   DISCHARGE MEDICATIONS:  1. Atenolol 25 mg daily.  2. Protonix 40 mg daily for acid reflux.  3. Vitamin D and calcium supplements as before.  4. Darvocet-N 100 1 pill every 4-6 hours as needed for pain.  5. Phenergan 25 mg p.o. q.6h. as needed for nausea.   HISTORY OF PRESENT ILLNESS:  Michelle Keith is a 77 year old female who  presented on August 05, 2007 with a 1-day history of crampy abdominal  pain in the epigastric area which then migrated to the left upper  quadrant.  This was associated with increased watery diarrhea greater  than her baseline and some persistent nausea and anorexia.  While  waiting to be evaluated in the emergency room, she had more nausea and  had an episode of syncope.  She had no seizure activity, chest pain,  shortness of breath, or palpitations and quickly became responsive after  the episode of syncope.  In the emergency room, CT scan revealed a  partial small-bowel obstruction with possible transition point in the  left upper quadrant possibly associated with adhesions.  She was  admitted for further care.   HOSPITAL COURSE:  Michelle Keith was admitted to a regular bed.  She was placed  on bowel rest and IV fluids.  She was evaluated by gastroenterology and  general surgery.  Her symptoms gradually improved, and her abdominal  exam improved.  She did undergo colonoscopy, with the results as noted  above.  It was felt that she may need surgery for exploration and lysis  of adhesions.  However, it was felt that this could wait until a later  time.  On August 09, 2007, she was deemed stable for discharge home.   PHYSICAL EXAMINATION ON DISCHARGE:  VITAL SIGNS:  Temperature 98.7,  afebrile, pulse 55, respiratory 20, blood pressure 117/72, 96%  saturation on room air.  GENERAL:  She is  alert and oriented x4.  LUNGS:  Clear to auscultation bilaterally with no wheezes, rales, or  rhonchi.  HEART:  Regular rate and rhythm.  No murmur or gallop.  ABDOMEN:  Soft, nontender, nondistended.  There was no edema or clubbing  or cyanosis.   DISCHARGE LABORATORY DATA:  On August 09, 2007, sodium was 135,  potassium 3.9, chloride 104, CO2 26, BUN 3, creatinine 0.62, glucose  102, calcium 8.4.  Liver function test on August 09, 2007 were all  normal, with the exception of a slightly low albumin of 2.9.  On  August 09, 2007, white count was 7.4 with a normal differential,  hemoglobin 12.6, and platelet count 281,000.  Urinalysis on admission  was normal.   She did have a CT scan on August 05, 2007 which showed findings  suggestive of a partial small-bowel obstruction with a relative  transition point in the jejunum of the left abdomen.  There were some  probable cysts in the liver, and there was  some evidence of  atherosclerosis.  There were no acute findings in the pelvis.   DISCHARGE INSTRUCTIONS:  Michelle Keith is to follow a low residue regular  diet.  She is to call if she has any recurrent problems.  She is to  increase her activity slowly.  She is to follow up with Dr. Wenda Low  in the near future for possible surgery.  She is to follow up in the  office with Dr. Waynard Edwards in the next 2-3 weeks.      Mark A. Perini, M.D.  Electronically Signed     MAP/MEDQ  D:  08/20/2007  T:  08/21/2007  Job:  [id number]   cc:   Thornton Park Daphine Deutscher, MD  [address], [address]  [place]  [place] [address]   Bernette Redbird, M.D.  Fax: [phone]

## 2010-11-02 NOTE — Op Note (Signed)
NAME:  Michelle, Keith NO.:  [id number]   MEDICAL RECORD NO.:  [id number]          PATIENT TYPE:  OIB   LOCATION:  6735                         FACILITY:  [facility]   PHYSICIAN:  Thornton Park. Daphine Deutscher, MD  DATE OF BIRTH:  1932-04-09   DATE OF PROCEDURE:  08/22/2007  DATE OF DISCHARGE:                               OPERATIVE REPORT   PREOPERATIVE INDICATIONS:  Michelle Keith was recently admitted from  February 14 through August 09, 2007, with what was thought to be a  partial small bowel obstruction related to what was seen on a CT scan  when she came in with some cramping left upper quadrant abdominal pain.  She was seen by me in the hospital then.  She underwent a colonoscopy  during that admission which was negative.  Informed consent was obtained  regarding laparoscopy, possible laparotomy to look for evidence of  adhesions or something else to explain the cause of her pain.   DESCRIPTION OF PROCEDURE:  After general anesthesia was administered the  abdomen was prepped with Techni-Care and draped sterilely.  I entered  the abdomen through the left upper quadrant through her old incision  using an Optiview technique without difficulty, insufflating the abdomen  and then surveying everything.  Two other trocar sites were used sort of  in the lower abdomen on either side of the umbilicus; two other 5 mm  trocar sites were chosen and used and from these with the camera in the  right lower quadrant I put her in head down and rotated her to the left  and then identified the cecum.  The cecum appeared to have a modest  amount of stool in it and appendix was tucked retrocecally.  There was  no evidence of inflammatory changes there.  I then identified the  terminal ileum and worked my way backwards.  I did find some appendices  of fat that were stuck to the small bowel working my way back but no  creeping fat and no hernias or masses or adhesions.  The bowel was run  using  Glassman clamps from the terminal ileum all the way back to the  ligament of Treitz and I found no evidence of any adhesions to any  structures.  The colon was examined and then in the left lower quadrant  she had some chronic adhesions in the left lower quadrant but nothing  acute.  The stomach looked a little bit distended but it was not  inflamed and I looked up at the wrap and it looked to be good and in  good position and no evidence of recurrent hernia that I could see.  The  area in the spleen appeared unremarkable as well.   I looked at her gallbladder and it was robin's egg blue although there  were some adhesions on the medial aspect to the duodenum.  I went ahead  and took those down with sharp dissection.  The gallbladder itself  appeared to be without evidence of chronic inflammatory changes.  I  thought these might have been related to her previous  herniation of her  stomach up into her chest.  When completed all the port sites were  injected with some Marcaine  and everything appeared to be in order.  All the old previous trocar  sites were without any evidence of any hernia.  The abdomen was  deflated.  Wounds were closed with 4-0 Vicryl and Steri-Strips.  The  patient tolerated the procedure well and will likely be kept for  overnight observation.      Thornton Park Daphine Deutscher, MD  Electronically Signed     MBM/MEDQ  D:  08/22/2007  T:  08/22/2007  Job:  [id number]   cc:   Loraine Leriche A. Perini, M.D.  Bernette Redbird, M.D.

## 2010-11-05 NOTE — Procedures (Signed)
[facility]  Patient:    Michelle Keith, Michelle Keith                        MRN: [id number] Proc. Date: 07/14/00 Adm. Date:  [id number] Attending:  Rich Brave CC:         Rodrigo Ran, M.D.   Procedure Report  PROCEDURE:  Colonoscopy with biopsy.  INDICATIONS:  This is a 77 year old female with chronic intermittent diarrhea suggestive of irritable bowel syndrome, who wished to have colon cancer screening.  FINDINGS:  Normal exam to the terminal ileum.  INFORMED CONSENT:  The nature, purpose, and risks of the procedure have been discussed with the patient who provided written consent.  SEDATION:  Fentanyl ______ mcg and Versed 8 mg IV without arrhythmias or desaturation.  DESCRIPTION OF PROCEDURE:  The Olympus PCF-140L, pediatric, extended length video colonoscope was advanced to the terminal ileum without significant difficulty, returning the patient into the supine position and applying some external abdominal compression to facilitate passage.  Pullback was then performed.  The terminal ileum had a normal appearance and was not biopsied.  The colonic mucosa was also normal, and without evidence of colitis, vascular malformations, polyps, diverticular disease, or masses.  Random colonic mucosal biopsies were obtained to help confirm of microscopic colitis or collagenous colitis.  Retroflexion of the rectum was normal.  The patient tolerated the procedure well and there were no apparent complications.  IMPRESSION:  Normal colonoscopy in a patient with diarrhea.  No endoscopic source of diarrhea seen.  PLAN:  Await pathology and biopsies.  Consider flexible sigmoidoscopy in five years and followup colonoscopy in 10 years. DD:  07/14/00 TD:  07/15/00 Job: [id number]

## 2010-11-05 NOTE — Assessment & Plan Note (Signed)
[facility]                                 ON-CALL NOTE   KAYSLEE, FUREY                          MRN:          [id number]  DATE:07/06/2006                            DOB:          05-Jan-1932    DESCRIPTION OF CALL:  Ms. Michelle Keith is a 77 year old woman with a history of  gastroesophageal reflux disease, status post Nissen fundoplication as  well as a history of tachy palpitations with no documented arrhythmia.  She is maintained on atenolol.  She said the other night she woke up  with some palpitations.  She took her heart rate with her blood pressure  machine it was 109.  She felt her heart rate stayed up for a day or so  and now it is back down.  She was wondering if it might have been caused  by the fish oil she recently started.  She says she feels fine now, no  chest pain, no dyspnea.  Just was curious about the fish oil.  She is on  atenolol.  I told her that it was unlikely to be due to the atenolol and  have set her up for a 48-hour Holter monitor just to make sure she is  not having any underlying atrial fibrillation.  She said she did have a  monitor several years ago which was negative.  She will follow up with  Dr. Daleen Squibb as scheduled.     Bevelyn Buckles. Bensimhon, MD  Electronically Signed    DRB/MedQ  DD: 07/06/2006  DT: 07/06/2006  Job #: [id number]

## 2010-11-05 NOTE — Discharge Summary (Signed)
   NAME:  Michelle Keith, Michelle Keith                           ACCOUNT NO.:  [id number]   MEDICAL RECORD NO.:  [id number]                   PATIENT TYPE:  INP   LOCATION:  3709                                 FACILITY:  [facility]   PHYSICIAN:  Gene Serpe, P.A. [facility]                DATE OF BIRTH:  05/30/1932   DATE OF ADMISSION:  10/04/2002  DATE OF DISCHARGE:  10/06/2002                           DISCHARGE SUMMARY - REFERRING   ADDENDUM:   DISCHARGE DIAGNOSES:  1. Dyslipidemia.                                               Gene Serpe, P.A. [facility]    GS/MEDQ  D:  10/06/2002  T:  10/06/2002  Job:  [id number]

## 2010-11-05 NOTE — Procedures (Signed)
[facility]  Patient:    Michelle Keith, Michelle Keith Visit Number: [id number] MRN: [id number]          Service Type: END Location: ENDO Attending Physician:  Rich Brave Dictated by:   Florencia Reasons, M.D. Proc. Date: 06/25/01 Admit Date:  06/25/2001   CC:         Rodrigo Ran, M.D.  Charlcie Cradle Delford Field, M.D. [facility]   Procedure Report  PROCEDURE PERFORMED:  Upper endoscopy.  ENDOSCOPIST:  Florencia Reasons, M.D.  INDICATIONS FOR PROCEDURE:  The patient is a 77 year old female with recurring episodes of nocturnal breathlessness, seemingly in association with nocturnal regurgitation of gastric contents.  She is known to have a large hiatal hernia from prior endoscopic evaluation, when she underwent endoscopic dilatation of an esophageal ring about four years ago.  FINDINGS:  No obvious reflux laryngitis to my exam.  No reflux esophagitis. Questionable esophageal spasm.  Esophageal ring.  Large hiatal hernia with patulous diaphragmatic hiatus.  DESCRIPTION OF PROCEDURE:  The nature, purpose and risks of the procedure were familiar to the patient from prior examination and she provided written consent.  Sedation was fentanyl 60 mcg and Versed 6 mg IV without arrhythmias or desaturation.  The Olympus GIF 160 small caliber adult video endoscope was passed under direct vision.  I was able to examine the hypopharyngeal structures for a moment or two and they looked grossly normal.  Specifically, I did not see any obvious reflux laryngitis or tumors.  The esophagus was entered under direct vision.  The esophageal mucosa was normal and in particular, there was no evidence of reflux esophagitis, Barretts esophagus, varices, infection or neoplasia.  No free reflux was seen.  There was a well-formed esophageal ring at the squamocolumnar junction, which did not offer any resistance to passage of the 9 mm endoscope.  The esophageal ring was located at  about 35 cm and the diaphragmatic hiatus was at about 41 cm, constituting roughly a 6 cm hiatal hernia, roughly the same size as noted four years ago.  The diaphragmatic hiatal hernia was extremely patulous, probably at least 10 cm or so in diameter.  The abdominal portion of the stomach was free of any gastritis, erosions, ulcers, polyps or masses and a retroflex view did not show any Cameron lesions at the diaphragmatic hiatus or any abnormalities in the cardia of the stomach.  The pylorus, duodenal bulb and grossly the second duodenum were normal in appearance.  The scope was removed from the patient.  No biopsies were obtained.  The patient tolerated the procedure well and there were no apparent complications.  IMPRESSION: 1. Large hiatal hernia and patulous diaphragmatic hiatus, placing the patient    at risk for periodic regurgitation and thereby supporting the possibility    that the patients nocturnal episodes of restlessness could be due to    regurgitation and associated laryngospasm. 2. No definite reflux laryngitis to my examination. 3. Esophageal ring, reasonably patent and only mildly symptomatic for    the patient by her current history.  PLAN: 1. ENT evaluation. 2. Consider surgical referral for antireflux surgery. Dictated by:   Florencia Reasons, M.D. Attending Physician:  Rich Brave DD:  06/25/01 TD:  06/25/01 Job: [id number]

## 2010-11-05 NOTE — Op Note (Signed)
NAME:  Michelle Keith, Michelle Keith NO.:  [id number]   MEDICAL RECORD NO.:  [id number]          PATIENT TYPE:  AMB   LOCATION:  DAY                          [facility]ACILITY[facility]  [facility]   PHYSICIAN:  Thornton Park. Daphine Deutscher, MD  DATE OF BIRTH:  25-Sep-1931   DATE OF PROCEDURE:  06/22/2004  DATE OF DISCHARGE:                                 OPERATIVE REPORT   PREOPERATIVE DIAGNOSIS:  Large type 3 mixed hiatal hernia.   POSTOPERATIVE DIAGNOSIS:  Large type 3 mixed hiatal hernia.   PROCEDURES:  1.  Laparoscopic takedown of intrathoracic stomach.  2.  Repair of diaphragm with pledgets.  3.  Nissan fundoplication over a #50 bougie.   SURGEON:  Thornton Park. Daphine Deutscher, M.D.   ANESTHESIA:  General endotracheal.   DESCRIPTION OF PROCEDURE:  Michelle Keith is a 77 year old lady seen by me and  evaluated for the above-mentioned problem.  Preoperatively she had a  manometric study within normal limits and an upper GI which I had reviewed  showed a large type 3 mixed paraesophageal hiatal hernia.  She was taken to  room 11 on June 22, 2004, and given general anesthesia.  The abdomen was  prepped with Betadine and draped sterilely.  I entered the abdomen using the  Optiview technique in the left upper quadrant and placed two more 12s on the  right side and at 10-11 below the umbilicus and a 5 mm in the upper midline  for the articulating liver retractor.  The esophageal hiatus was then  identified and there was a very large hiatal hernia noted with the stomach  up in the chest.  I grabbed this and brought this down.  I then used a  harmonic scalpel to remove the sac from the chest.  When I did this, I got a  good length of esophagus into the abdomen.  With a Penrose around the  esophagus I then placed three sutures posteriorly using pledgets, some with  three pledgets and nicely closed most of the esophagus.  A single anterior  pledgeted suture was used.   Next I went around behind the esophagus in this  nice window and brought  around cardia and then plicated the distal esophagus with the  fundoplication.  Three sutures were placed, the first taking a bite of  esophagus.  This was done with the EndoStitch with intracorporeal ties.  Clips were placed on the notch to facilitate identification of the wrap  radiographically.   There was essentially no bleeding noted.  Everything looked good.  I was  under no suspicion that there had been any enterotomies.  I did use a #50  lighted bougie which was passed by anesthesia to calibrate the closure of  the hiatus, as well as with the Nissan wrap.  When completed, we deflated  the abdominal cavity.  Trocars were removed and were infiltrated with  lidocaine.  The patient tolerated the procedure well and was taken to the  recovery room.     Matt   MBM/MEDQ  D:  06/22/2004  T:  06/22/2004  Job:  [id number]  cc:   Loraine Leriche A. Waynard Edwards, M.D.  [address]  [place]  [place] [address]  Fax: [phone]   Bernette Redbird, M.D.  [address]  [place], [place] [address]  Fax: [phone]   Shan Levans, M.D. [facility]

## 2010-11-05 NOTE — Assessment & Plan Note (Signed)
[facility]                            CARDIOLOGY OFFICE NOTE   Michelle, Keith                        MRN:          [id number]  DATE:07/19/2006                            DOB:          02/12/1932    July 19, 2006   Michelle Keith comes in today per her request for chest tightness and pressure  and palpitations.   PROBLEM LIST:  1. Small perimembranous ventricular septal defect, asymptomatic.  2. Palpitations which come in cycles.  This has responded beautifully      to atenolol 25 b.i.d.  3. Intermittent chest pain with a negative stress Myoview in the past.   Yesterday she was at the [employer] downtown in [place], [place].  She was having chest pressure most of the day.  She denied any nausea,  vomiting, diaphoresis or shortness of breath.  She also wakes up with  this type of chest pressure.  It does not radiate in the middle of the  night.   She had a Nissen fundoplication for severe gastroesophageal reflux about  a year and a half ago.  It has not held up for her.  She is on Protonix  40 mg a day.   She is also concerned that taking fish oil, which has helped her  diarrhea from the fundoplication, that this might be causing rhythm  problems of the heart.   MEDICATIONS:  1. Calcium daily.  2. Atenolol 25 b.i.d.  3. Protonix 40 mg a day.  4. Fish oil daily.   PHYSICAL EXAMINATION:  GENERAL APPEARANCE:  She is in no acute distress.  VITAL SIGNS:  Blood pressure is 130/82, pulse 81 and regular. Weight  132.  HEENT:  Normocephalic and atraumatic.  PERRLA.  Extraocular movements  intact.  Sclerae are clear.  Facial symmetry is normal.  NECK:  Carotid upstrokes equal bilaterally without bruits.  There is no  JVD.  Thyroid is not enlarged. Trachea is midline.  LUNGS:  Clear to auscultation and percussion without wheezing.  CARDIOVASCULAR:  Nondisplaced PMI.  She has a soft systolic murmur on  the left sternal border.  There is no  gallop or rub.  ABDOMEN:  Soft with good bowel sounds.  There is no obvious epigastric  tenderness.  There is no hepatomegaly.  EXTREMITIES:  No clubbing, cyanosis, or edema.  Pulses are intact.  NEUROLOGIC:  Intact.  SKIN:  Some mild ecchymoses.   EKG today is completely normal.  I spent about 20 minutes talking to Ms.  Octaviano Keith and reassuring her that her chest discomfort is most likely  gastroesophageal reflux.  I do not think it is cardiovascular.  She is  having some palpitations but the atenolol has helped well.  I also tried  to reassure her that fish oil was not causing any rhythm problems with a  normal ventricle.   RECOMMENDATIONS:  1. Continue atenolol 25 b.i.d.  2. Increase Protonix to 40 b.i.d. until her symptoms resolve.  3. Continue fish oil.  4. Elevate head of bed.  5. I will plan on seeing her back in six  months or p.r.n.     Thomas C. Daleen Squibb, MD, [facility]  Electronically Signed    TCW/MedQ  DD: 07/19/2006  DT: 07/19/2006  Job #: [id number]   cc:   Loraine Leriche A. Perini, M.D.

## 2010-11-05 NOTE — Procedures (Signed)
[facility]  Patient:    Michelle Keith, Michelle Keith Visit Number: [id number] MRN: [id number]          Service Type: DSU Location: CARD Attending Physician:  Caleb Popp Dictated by:   Charlcie Cradle Delford Field, M.D. [facility] Proc. Date: 07/13/01 Admit Date:  07/13/2001   CC:         Rodrigo Ran, M.D.  Florencia Reasons, M.D.   Procedure Report  PROCEDURE:  Bronchoscopy.  INDICATION:  Coughing paroxysms and paroxysms of dyspnea.  Evaluate for cause.  OPERATOR:  Charlcie Cradle. Delford Field, M.D.  ANESTHESIA:  1% Xylocaine local.  PREOPERATIVE MEDICATIONS:  Demerol 50 mg IV push, Versed 4 mg IV push.  DESCRIPTION OF PROCEDURE:  The Pentax video bronchoscope was introduced to the right naris.  The upper airways were visualized, and there was evidence for vocal cord dysfunction.  There was chinking and inspiratory and expiratory narrowing of the true vocal cords.  Only a small airway opening appeared to occur both during inspiration and expiration.  In the trachea, there was significant bowing of the membranous portion of the trachea in the upper one-third of the trachea with cough causing about a 10-20% reduction in diameter of the tracheal opening.  This occurred right below the true vocal cords and in the subglottic area.  There was also a mild tracheobronchitis seen distally without evidence of endobronchial lesions.  No biopsy were taken.  COMPLICATIONS:  None.  IMPRESSION: 1. Mild tracheobronchitis. 2. Vocal cord dysfunction syndrome exacerbation by gastroesophageal reflux    disease and anxiety neuroses. 3. Mild malacia of the trachea itself.  RECOMMENDATIONS:  Will consider institution of Pulmicort therapy to treat lower airway inflammation.  Will consider referral to a speech language pathologist for vocal cord dysfunction syndrome therapy and will consider more vigorous treatment of gastroesophageal reflux disease with double-dose proton pump  inhibitor. Dictated by:   Charlcie Cradle Delford Field, M.D. [facility] Attending Physician:  Caleb Popp DD:  07/13/01 TD:  07/14/01 Job: [id number]

## 2010-11-05 NOTE — Discharge Summary (Signed)
NAME:  Michelle Keith, Michelle Keith NO.:  [id number]   MEDICAL RECORD NO.:  [id number]          PATIENT TYPE:  INP   LOCATION:  0377                           FACILITY:   PHYSICIAN:  Thornton Park. Daphine Deutscher, MD  DATE OF BIRTH:  1932/02/23   DATE OF ADMISSION:  06/22/2004  DATE OF DISCHARGE:  06/25/2004                                 DISCHARGE SUMMARY   ADMISSION DIAGNOSIS:  Large type 3 mixed hiatal hernia.   DISCHARGE DIAGNOSIS:  Large type 3 mixed hiatal hernia.   PROCEDURE:  Laparoscopic Nissen fundoplication over a #50 dilator with  pledgeted repair of the diaphragm hiatus.   HOSPITAL COURSE:  [place] underwent the above operation on June 22, 2004.  On postoperative day 1 she had a swallow which looked good, showed  good reduction and repair of the hernia.  Postoperative day 2 she was  transferred to the floor where she was begun on liquids, advanced to full  liquids.  She was ready for discharge on June 25, 2004, doing well,  condition good, incision looked fine.  Prescription for Lortab elixir 300 mg  sig. 5 to 15 mL by mouth every four to six hours for pain.  She was asked to  return to the office in three weeks.  Instructions were given.     Matt   MBM/MEDQ  D:  06/25/2004  T:  06/25/2004  Job:  [id number]   cc:   Shan Levans, M.D. [facility] A. Waynard Edwards, M.D.  [address]  [place]  [place] [address]  Fax: [phone]   Bernette Redbird, M.D.  [address] [place]  [place], [place] [address]  Fax: [phone]

## 2010-11-05 NOTE — Op Note (Signed)
NAME:  JODILYN, GIESE NO.:  [id number]   MEDICAL RECORD NO.:  [id number]          PATIENT TYPE:  AMB   LOCATION:  ENDO                         FACILITY:  [facility]   PHYSICIAN:  Bernette Redbird, M.D.   DATE OF BIRTH:  1932/03/14   DATE OF PROCEDURE:  05/18/2004  DATE OF DISCHARGE:  05/18/2004                                 OPERATIVE REPORT   PROCEDURE PERFORMED:  Esophageal manometry.   INDICATIONS FOR PROCEDURE:  The patient is a 77 year old female being  considered for possible hiatal hernia repair and possible antireflux  surgery, based on recurrent episodes of breathlessness and a the  documented presence of a large paraesophageal hiatal hernia.   FINDINGS:  Essentially normal manometry, with localized low esophageal  amplitudes.   PROCEDURE:  The procedure had been discussed with the patient who provided  written consent.  The procedure was done as an outpatient via a transnasally  placed manometry catheter by the endoscopy nurse.  Sequential pull-throughs  were performed.   FINDINGS:  1.  Lower esophageal sphincter.  The lower esophageal sphincter had a normal      resting tone of 37 mmHg (normal 10 to 45), and it relaxed appropriately      with swallowing.  2.  Esophageal body.  The esophageal body amplitudes ranged from a low of 18      mmHg to a high of 104 mmHg but generally were in the 50 to 100 range.      This was within normal limits (normal is 30 to 180 mmHg).  Contractions      were entirely peristaltic in character.  There was no evidence of spasm or achalasia.  1.  Upper esophageal sphincter.  The upper esophageal sphincter had      coordinated relaxation to swallows.   IMPRESSION:  Manometric study within normal limits.   PLAN:  No manometric contraindication to the contemplated hiatal hernia  repair surgery and possible antireflux surgery.       RB/MEDQ  D:  05/24/2004  T:  05/25/2004  Job:  [id number]   cc:   Loraine Leriche A. Waynard Edwards, M.D.  [address]  [place]  [place] [address]  Fax: [phone]   Shan Levans, M.D. [facility]   Maisie Fus C. Wall, M.D.   Thornton Park Daphine Deutscher, MD  [address], [address]  [place]  [place] [address]  Fax: [phone]

## 2010-11-05 NOTE — Discharge Summary (Signed)
   NAME:  Michelle Keith, Michelle Keith                           ACCOUNT NO.:  [id number]   MEDICAL RECORD NO.:  [id number]                   PATIENT TYPE:  INP   LOCATION:  3709                                 FACILITY:  [facility]   PHYSICIAN:  Jesse Sans. Wall, M.D. [facility]            DATE OF BIRTH:  30-Sep-1931   DATE OF ADMISSION:  DATE OF DISCHARGE:  10/06/2002                           DISCHARGE SUMMARY - REFERRING   PROCEDURE:  Adenosine Cardiolite October 05, 2002.   HISTORY OF PRESENT ILLNESS:  Please refer to dictated admission note.   LABORATORY DATA:  Serial cardiac enzymes normal.  TSH normal.  Lipid  profile:  Total cholesterol 224, triglycerides 129, HDL 62, LDL 136 (ratio  3.6).  CBC normal.  Complete metabolic profile normal.   ADMISSION CXR:  No active disease.   HOSPITAL COURSE:  The patient presented for evaluation of chest pain.  There  is no prior history of coronary artery disease.  Minimal cardiac risk  factors and history of small VSD, followed by Dr. Valera Castle.   Serial cardiac enzymes were all within normal limits.  The patient was thus  referred for pharmacologic stress testing.   The perfusion images showed no evidence of ischemic/ infarction, and the  calculated ejection fraction was 31%.  The patient reported having had  echocardiograms in the past, but did not recall, however, being diagnosed  with LV dysfunction.  Following review by Dr. Dietrich Pates and in consultation  with the patient, recommendation was to proceed with discharge, with early  followup 2-D echocardiogram, in the office, with Dr. Valera Castle.   No new medication adjustments made during this brief stay.   DISCHARGE MEDICATIONS:  1. Protonix 40 mg, b.i.d.  2. Atenolol 25 mg every day  3. Valium 2.5 mg as needed.   DISCHARGE INSTRUCTIONS:  The patient is instructed to follow up with Dr.  Valera Castle next week in conjunction with a 2-D echocardiogram.  Arrangements will be made through our office.   DISCHARGE DIAGNOSES:  1. Nonischemic chest pain.     a. Normal serial cardiac markers.     b. Nonischemic adenosine Cardiolite; ejection fraction 31%.  2. History of small ventricular septal defect.  3. Hiatal hernia/ gastroesophageal reflux disease.  4. History of tachy palpitations.    Gene Serpe, P.A. [facility]                      Thomas C. Daleen Squibb, M.D. [facility]   GS/MEDQ  D:  10/06/2002  T:  10/06/2002  Job:  [id number]

## 2010-11-05 NOTE — H&P (Signed)
NAME:  Michelle Keith, Michelle Keith                           ACCOUNT NO.:  [id number]   MEDICAL RECORD NO.:  [id number]                   PATIENT TYPE:  INP   LOCATION:  1826                                 FACILITY:  [facility]   PHYSICIAN:  Jesse Sans. Wall, M.D. [facility]            DATE OF BIRTH:  Dec 28, 1931   DATE OF ADMISSION:  10/04/2002  DATE OF DISCHARGE:                                HISTORY & PHYSICAL   HISTORY OF PRESENT ILLNESS:  The patient came to the emergency room referred  by [facility] who she saw this morning for some  chest pain.   She is a 77 year old married white female, patient of mine and Dr. Laurey Morale,  who has a history of small VSD that I follow annually in the office. She has  had tachy palpitations in the past but it has been under good control with  Atenolol 25 mg a day.   Her cardiac risk factor is her age but she has no other significant factors.  She has, however, had some chest pressure and chest tightness with shortness  of breath with exertion over the past several weeks.  She had bronchitis  about six weeks ago and said she really has not gotten over it.  However,  she saw a doctor 10 days ago who felt that her lungs were clear and she was  doing well.   ALLERGIES:  She is intolerant to ASPIRIN which causes some diarrhea.   CURRENT MEDICATIONS:  1. Protonix 40 mg b.i.d. for gastroesophageal reflux.  2. Atenolol 25 mg a day.  3. Diazepam 5 mg half daily p.r.n.   PAST MEDICAL HISTORY:  She has a history of significant reflux problems  resulting in vocal cord dysfunction and hoarseness. She sees Dr. Shan Levans for this.   SOCIAL HISTORY:  She lives in [place], [place], with her husband.  She is a Futures trader.   HABITS:  She does not smoke or drink.   FAMILY HISTORY:  This is noncontributory.   REVIEW OF SYSTEMS:  This is unremarkable other than HPI.   PHYSICAL EXAMINATION:  GENERAL APPEARANCE:  She is in no acute distress.  VITAL SIGNS:  Blood pressure 155/88, pulse 76 and regular, respiratory rate  14, O2 saturations were adequate on room air.  SKIN:  Warm and dry.  There are no rashes or lesions.  HEENT:  Examination is unremarkable.  NECK:  There is no JVD, carotid upstroke are equal bilaterally without  bruits. There is no thyromegaly.  LUNGS:  Clear to auscultation.  CARDIOVASCULAR:  There is a regular rate and rhythm with a short systolic  murmur along the left sternal border.  ABDOMEN:  Soft with good bowel sounds.  EXTREMITIES:  No clubbing, cyanosis, or edema.  Pulses are present  bilaterally.   Chest x-ray is currently pending.   EKG shows sinus rhythm with rate of 66.  No ischemic changes.   LABORATORY DATA:  Her initial troponin  and CPK are negative x1.  CBC and  chemistries are fine.  Coagulation factors are fine.   ASSESSMENT AND PLAN:  1. Chest pain.  Some of this is worrisome for ischemic heart disease as she     has very few risk factors.  I think admitting her, ruling her out and     doing a stress adenosine Cardiolite tomorrow would be appropriate.  2. Small ventricular septal defect.  3. History of tachy palpitations well controlled with beta-blockade.  4. Gastroesophageal reflux and hiatal hernia. This may be responsible for     #1.   If her Cardiolite is negative, will discharge her tomorrow.   Chest x-ray was done, portable in the emergency room that shows no acute  cardiopulmonary disease.                                               Thomas C. Daleen Squibb, M.D. [facility]    TCW/MEDQ  D:  10/04/2002  T:  10/04/2002  Job:  [id number]   cc:   Loraine Leriche A. Waynard Edwards, M.D.  [address]  [place]  [place] [address]  Fax: [phone]   [facility]

## 2010-11-05 NOTE — Op Note (Signed)
NAME:  Michelle Keith, Michelle Keith NO.:  [id number]   MEDICAL RECORD NO.:  [id number]          PATIENT TYPE:  AMB   LOCATION:  ENDO                         FACILITY:  [facility]   PHYSICIAN:  Bernette Redbird, M.D.   DATE OF BIRTH:  [birth year]   DATE OF PROCEDURE:  05/06/2004  DATE OF DISCHARGE:                                 OPERATIVE REPORT   PROCEDURE PERFORMED:  Upper endoscopy.   ENDOSCOPIST:  Florencia Reasons, M.D.   INDICATIONS FOR PROCEDURE:  Preop evaluation for possible antireflux surgery  in a 77 year old female with recurring nocturnal reflux and a known large  hiatal hernia.   FINDINGS:  A 5 cm hiatal hernia.   DESCRIPTION OF PROCEDURE:  The nature, purpose and risks of the procedure  were familiar to the patient and she provided written consent.  No topical  pharyngeal anesthesia was given.  Care was given to make sure that the neck  was not flexed or extended during this procedure because she has a prior  history of having developed neck pain following one of her previous  endoscopies.  Sedation was fentanyl 75 mcg and Versed  7.5 mg IV without  arrhythmias or desaturation.  To avoid breathlessness, no topical pharyngeal  anesthesia was administered.  The Olympus small caliber adult video  endoscope was passed under direct vision.  The larynx and vocal cords were  fairly well seen and were without any overt abnormalities to my inspection.  Specifically, I did not see any frank reflux laryngitis.  There might have  been some thickening of the posterior commissure.  The esophagus was entered  without significant difficulty under direct vision.  The esophageal mucosa  was normal.  There was no evidence of reflux esophagus, free reflux,  Barrett's esophagus, varices, infection, neoplasia or any ring or stricture.   The lower esophageal sphincter was located at 35 cm from the mouth and the  diaphragmatic hiatus was at 40 cm, constituting a 5 cm hiatal hernia.  However, the hiatal hernia was rather broad in transverse dimension,  probably at least 5 to 8 if not 10 cm.  The diaphragmatic hiatus, best seen  on retroflex viewing, was widely patulous, probably measuring at least 8 cm  across.  Interestingly, a little bit of reflux bile was noted in the hiatal  hernia pouch.  The stomach was normal without evidence of gastritis,  erosions, ulcers, polyps or masses and a retroflex view showed the large  hiatal hernia as noted above.  The pylorus, duodenal bulb and proximal  duodenum looked normal.   The scope was then removed from the patient.  No biopsies were obtained.  The patient tolerated the procedure well and there were no apparent  complications.   IMPRESSION:  Large  hiatal hernia as described above with diaphragmatic  hiatus which may promote reflux as appears to be occurring nocturnally in  this patient.  No endoscopic contraindication to proposed antireflux surgery  identified.   PLAN:  Follow-up with [place]. Daphine Deutscher, MD for consideration of antireflux  surgery.       RB/MEDQ  D:  05/06/2004  T:  05/07/2004  Job:  [id number]   cc:   Loraine Leriche A. Waynard Edwards, M.D.  [address]  [place]  [place] [address]  Fax: [phone]   Shan Levans, M.D. [facility]   Maisie Fus C. Wall, M.D.   [place] Daphine Deutscher, MD  [address]  [place]  [place] [address]  Fax: [phone]

## 2010-11-05 NOTE — Assessment & Plan Note (Signed)
[facility]                            CARDIOLOGY OFFICE NOTE   Michelle Keith, Michelle Keith                        MRN:          [id number]  DATE:10/12/2006                            DOB:          [birth year]    Michelle Keith returns today for further management of chest discomfort.   She awoke the other night out of a dead sleep, and had a twisting  tightness in the midsternum.  It went up into the right tonsil area.  She walked around for quite a while, and finally after she bent over, it  was relieved.   She has had a history of gastroesophageal reflux, and a Nissen  fundoplication.  She is followed by Dr. Nadine Counts Buccini.  She remains on  Protonix 40 mg a day.   She has very few risk factors for coronary disease.  She denies any  exertional angina or ischemic symptoms.  Her last stress Myoview was  when she was hospitalized in 2004.  It was negative.  Her last echo was  Oct 19, 2005, which was essentially normal except for the small  perimembranous ventricular septal defect.  There was not minimal flow  across it.   She has had no further symptoms since this event the other night.   Her other medicines are:  1. Atenolol 25 b.i.d.  2. Protonix 40 mg daily.   EXAMINATION:  Her blood pressure is 137/74.  Pulse is 90 and regular.  She weighs 128.  HEENT:  Normocephalic and atraumatic.  PERRLA.  Extraocular movements  intact.  Sclerae are clear.  Facial symmetry is normal.  Dentition is  satisfactory.  Carotid upstrokes are equal bilaterally without bruits.  There is no  JVD.  Thyroid is not enlarged.  Trachea is midline.  LUNGS:  Clear.  HEART:  Reveals a nondisplaced PMI.  I can hear bowel sounds up into her  chest.  She has normal S1 and S2.  She has a slight amount of tenderness  along the left lower costosternal junction.  ABDOMINAL EXAM:  Soft with no epigastric tenderness.  There are good  bowel sounds.  There is no organomegaly.  EXTREMITIES:  Without  cyanosis, clubbing or edema.  Pulses are intact.  NEUROLOGIC:  Exam is intact.   EKG shows normal sinus rhythm with minimal criteria for LVH.  There has  been no significant change.   I had a nice chat with Michelle Keith.  I recommended the following:  1. Follow up with Dr. Matthias Hughs.  2. Sublingual nitroglycerin p.r.n. for probable esophageal spasm.  3. Adenosine rest/stress Myoview to rule out any obstructive coronary      disease.   Assuming number 3 is negative, I will see her back in a year.  If she  continues to have chest discomfort, we will see her back in several  weeks.     Thomas C. Daleen Squibb, MD, [facility]  Electronically Signed    TCW/MedQ  DD: 10/12/2006  DT: 10/12/2006  Job #: [id number]   cc:   Bernette Redbird, M.D.  [facility] Gainer. Perini, M.D.

## 2011-03-11 LAB — DIFFERENTIAL
Basophils Absolute: 0
Basophils Absolute: 0
Basophils Relative: 0
Basophils Relative: 0
Eosinophils Absolute: 0.1
Eosinophils Absolute: 0.1
Eosinophils Relative: 0
Lymphocytes Relative: 27
Lymphs Abs: 0.9
Lymphs Abs: 1
Monocytes Absolute: 0.2
Monocytes Absolute: 0.4
Monocytes Absolute: 0.5
Monocytes Relative: 13 — ABNORMAL HIGH
Monocytes Relative: 7
Neutro Abs: 5.8
Neutro Abs: 9.3 — ABNORMAL HIGH
Neutrophils Relative %: 61
Neutrophils Relative %: 66
Neutrophils Relative %: 78 — ABNORMAL HIGH
Smear Review: ADEQUATE

## 2011-03-11 LAB — HEPATIC FUNCTION PANEL
ALT: 13
AST: 16
Albumin: 2.9 — ABNORMAL LOW
Albumin: 3 — ABNORMAL LOW
Albumin: 3 — ABNORMAL LOW
Alkaline Phosphatase: 38 — ABNORMAL LOW
Alkaline Phosphatase: 46
Bilirubin, Direct: 0.1
Indirect Bilirubin: 0.3
Total Bilirubin: 0.3
Total Bilirubin: 0.8
Total Protein: 5.5 — ABNORMAL LOW

## 2011-03-11 LAB — BASIC METABOLIC PANEL
BUN: 1 — ABNORMAL LOW
BUN: 23
BUN: 6
CO2: 23
CO2: 26
Calcium: 8.4
Calcium: 8.6
Calcium: 8.8
Chloride: 104
Chloride: 110
Creatinine, Ser: 0.7
Creatinine, Ser: 0.71
GFR calc Af Amer: >60
GFR calc Af Amer: >60
GFR calc non Af Amer: >60
GFR calc non Af Amer: >60
Glucose, Bld: 135 — ABNORMAL HIGH
Glucose, Bld: 92
Potassium: 3.8
Sodium: 135
Sodium: 137
Sodium: 142
Sodium: 143

## 2011-03-11 LAB — URINALYSIS, ROUTINE W REFLEX MICROSCOPIC
Bilirubin Urine: NEGATIVE
Hgb urine dipstick: NEGATIVE
Ketones, ur: NEGATIVE
Specific Gravity, Urine: 1.028
Urobilinogen, UA: 0.2

## 2011-03-11 LAB — CBC
HCT: 35.9 — ABNORMAL LOW
HCT: 40.6
Hemoglobin: 12.2
Hemoglobin: 12.6
Hemoglobin: 13.9
MCHC: 33.9
MCHC: 34.2
MCHC: 34.6
MCV: 92.9
MCV: 93.4
Platelets: 212
Platelets: 271
Platelets: ADEQUATE
RBC: 3.86 — ABNORMAL LOW
RBC: 3.97
RDW: 12.9
RDW: 13
WBC: 3.2 — ABNORMAL LOW
WBC: 4.7
WBC: 7.4

## 2011-03-14 LAB — BASIC METABOLIC PANEL
BUN: 11
CO2: 29
Calcium: 9.7
Creatinine, Ser: 0.83
GFR calc non Af Amer: >60
Glucose, Bld: 105 — ABNORMAL HIGH
Sodium: 140

## 2011-03-14 LAB — CBC
Hemoglobin: 14
MCHC: 33.6
Platelets: 345
RDW: 12.9

## 2013-11-24 DIAGNOSIS — H35373 Puckering of macula, bilateral: Secondary | ICD-10-CM | POA: Insufficient documentation

## 2013-11-24 DIAGNOSIS — H35363 Drusen (degenerative) of macula, bilateral: Secondary | ICD-10-CM

## 2013-11-24 HISTORY — DX: Puckering of macula, bilateral: H35.373

## 2013-11-24 HISTORY — DX: Drusen (degenerative) of macula, bilateral: H35.363

## 2014-09-02 ENCOUNTER — Telehealth (HOSPITAL_COMMUNITY): Payer: Self-pay | Admitting: Cardiology

## 2014-09-02 NOTE — Telephone Encounter (Signed)
Received records from [facility] for appointment with Dr Percival Spanish on 10/06/14.  Records given to [facility] (medical records) for Dr Hochrein's schedule on 10/06/14.  lp

## 2014-10-06 ENCOUNTER — Ambulatory Visit: Payer: Self-pay | Admitting: Cardiology

## 2014-11-05 ENCOUNTER — Ambulatory Visit (INDEPENDENT_AMBULATORY_CARE_PROVIDER_SITE_OTHER): Payer: Medicare HMO | Admitting: Cardiology

## 2014-11-05 ENCOUNTER — Encounter: Payer: Self-pay | Admitting: Cardiology

## 2014-11-05 VITALS — BP 102/68 | HR 91 | Ht 65.0 in | Wt 120.0 lb

## 2014-11-05 DIAGNOSIS — R55 Syncope and collapse: Secondary | ICD-10-CM | POA: Diagnosis not present

## 2014-11-05 DIAGNOSIS — R002 Palpitations: Secondary | ICD-10-CM

## 2014-11-05 NOTE — Progress Notes (Signed)
Cardiology Office Note   Date:  11/05/2014   ID:  Michelle Keith, DOB June 27, 1931, MRN [id number]  PCP:  Jerlyn Ly, MD  Cardiologist:   Minus Breeding, MD   Chief Complaint  Patient presents with  . Palpitations  . Chest Pain      History of Present Illness: Michelle Keith is a 81 y.o. female who presents for evaluation of chest pain. She did have a history of syncope. She's had a workup that has included a negative stress perfusion study in 2011. Of note there is mention of a possible VSD in her primary care notes. However, I reviewed the echo results in 2007 and don't see mention of this. She had a normal echo. She presents for evaluation of chest discomfort. This actually happened in March but she had to cancel the appointment was made at that time. This was while at church. It was a heaviness lasted about 15 minutes. She may have a little of this when she lifts things that are heavy now. She says it's about a 6 out of 10. There is no radiation. There are no associated symptoms. She walks for exercise and cannot bring this on. She does have palpitations occasionally when she wakes up.  However, these don't happen during the rest of the day.  She does have still occasional syncope. She had one episode when she had some GI problems about a year ago. She had one episode when she was standing in line for a long time but she does know her triggers. I did review her previous notes from Dr. [name].  She was thought to have some orthostatic and autonomic syncope.   Past Medical History  Diagnosis Date  . Esophageal stricture   . Osteopenia   . GERD (gastroesophageal reflux disease)   . Right thyroid nodule   . Syncope   . Squamous cell skin cancer     Past Surgical History  Procedure Laterality Date  . Total abdominal hysterectomy    . Nissen fundoplication    . Cataract extraction    . Exploratory laparoscopy    . Esophageal dilation      x 4     Current Outpatient  Prescriptions  Medication Sig Dispense Refill  . cholecalciferol (VITAMIN D) 1000 UNITS tablet Take 1,000 Units by mouth daily.    . Multiple Vitamins-Minerals (PRESERVISION AREDS PO) Take by mouth 2 (two) times daily.    . Omega-3 Fatty Acids (FISH OIL) 1000 MG CAPS Take by mouth.    . pantoprazole (PROTONIX) 40 MG tablet Take 40 mg by mouth daily.    . Probiotic Product (PROBIOTIC DAILY PO) Take by mouth.    . raloxifene (EVISTA) 60 MG tablet Take 60 mg by mouth daily.     No current facility-administered medications for this visit.    Allergies:   Aspirin; Clarithromycin; Epinephrine; Lidocaine; Penicillins; Sulfonamide derivatives; and Tetracycline    Social History:  The patient  reports that she has never smoked. She does not have any smokeless tobacco history on file.   Family History:  The patient's family history includes Heart failure in her mother; Multiple myeloma in her father.    ROS:  Please see the history of present illness.   Otherwise, review of systems are positive for none.   All other systems are reviewed and negative.    PHYSICAL EXAM: VS:  BP 102/68 mmHg  Pulse 91  Ht '5\' 5"'  (1.651 m)  Wt 120 lb (54.432  kg)  BMI 19.97 kg/m2 , BMI Body mass index is 19.97 kg/(m^2). GENERAL:  Well appearing HEENT:  Pupils equal round and reactive, fundi not visualized, oral mucosa unremarkable NECK:  No jugular venous distention, waveform within normal limits, carotid upstroke brisk and symmetric, no bruits, no thyromegaly LYMPHATICS:  No cervical, inguinal adenopathy LUNGS:  Clear to auscultation bilaterally BACK:  No CVA tenderness CHEST:  Unremarkable HEART:  PMI not displaced or sustained,S1 and S2 within normal limits, no S3, no S4, no clicks, no rubs, no murmurs ABD:  Flat, positive bowel sounds normal in frequency in pitch, no bruits, no rebound, no guarding, no midline pulsatile mass, no hepatomegaly, no splenomegaly EXT:  2 plus pulses throughout, no edema, no  cyanosis no clubbing SKIN:  No rashes no nodules NEURO:  Cranial nerves II through XII grossly intact, motor grossly intact throughout PSYCH:  Cognitively intact, oriented to person place and time    EKG:  EKG is  ordered today. The ekg ordered today demonstrates sinus rhythm, rate 91, axis within normal limits, intervals within normal limits, no acute ST-T wave changes.   Recent Labs: No results found for requested labs within last 365 days.    Lipid Panel No results found for: CHOL, TRIG, HDL, CHOLHDL, VLDL, LDLCALC, LDLDIRECT    Wt Readings from Last 3 Encounters:  11/05/14 120 lb (54.432 kg)  11/19/09 125 lb (56.7 kg)  07/10/09 127 lb (57.607 kg)      Other studies Reviewed: Additional studies/ records that were reviewed today include: Previous cardiac work up , echo and [employer] Review of the above records demonstrates:  Please see elsewhere in the note.     ASSESSMENT AND PLAN:  CHEST PAIN:  I think the pretest probability of obstructive coronary disease is low. I will bring the patient back for a POET (Plain Old Exercise Test). This will allow me to screen for obstructive coronary disease, risk stratify and very importantly provide a prescription for exercise. She is worried about falling with this but she doesn't have any gait problems and she walks routinely. I think she would tolerate a modified Bruce protocol.  PALPITATIONS:  These happen only really when she wakes up.  I don't think further workup would be indicated.   Current medicines are reviewed at length with the patient today.  The patient does not have concerns regarding medicines.  The following changes have been made:  no change  Labs/ tests ordered today include:   Orders Placed This Encounter  Procedures  . Exercise Tolerance Test     Disposition:   FU with me as needed    Signed, Minus Breeding, MD  11/05/2014 11:31 AM    [place]

## 2014-11-05 NOTE — Patient Instructions (Signed)
Your physician recommends that you schedule a follow-up appointment in: as needed with Dr. Percival Spanish  We are ordering  A stress test for you to get done

## 2014-11-06 ENCOUNTER — Telehealth (HOSPITAL_COMMUNITY): Payer: Self-pay | Admitting: *Deleted

## 2014-11-10 NOTE — Addendum Note (Signed)
Addended by: Diana Eves on: 11/10/2014 11:12 AM   Modules accepted: Orders

## 2014-12-04 ENCOUNTER — Encounter (HOSPITAL_COMMUNITY): Payer: Medicare HMO

## 2014-12-04 ENCOUNTER — Ambulatory Visit: Payer: Self-pay | Admitting: Cardiology

## 2014-12-11 ENCOUNTER — Telehealth (HOSPITAL_COMMUNITY): Payer: Self-pay

## 2014-12-11 NOTE — Telephone Encounter (Signed)
Encounter complete. 

## 2014-12-16 ENCOUNTER — Ambulatory Visit (HOSPITAL_COMMUNITY)
Admission: RE | Admit: 2014-12-16 | Discharge: 2014-12-16 | Disposition: A | Discharge status: Home / Self Care | Payer: Medicare HMO | Source: Ambulatory Visit | Attending: Cardiology | Admitting: Cardiology

## 2014-12-16 DIAGNOSIS — R55 Syncope and collapse: Secondary | ICD-10-CM | POA: Diagnosis not present

## 2014-12-16 LAB — EXERCISE TOLERANCE TEST
CHL CUP RESTING HR STRESS: 115 {beats}/min
CSEPED: 1 min
CSEPEDS: 36 s
CSEPEW: 1.6 METS
CSEPHR: 89 %
MPHR: 140 {beats}/min
Peak HR: 125 {beats}/min
RPE: 12

## 2014-12-18 ENCOUNTER — Other Ambulatory Visit: Payer: Self-pay | Admitting: *Deleted

## 2014-12-18 DIAGNOSIS — R079 Chest pain, unspecified: Secondary | ICD-10-CM

## 2015-01-06 ENCOUNTER — Telehealth (HOSPITAL_COMMUNITY): Payer: Self-pay

## 2015-01-06 NOTE — Telephone Encounter (Signed)
Encounter complete. 

## 2015-01-08 ENCOUNTER — Ambulatory Visit (HOSPITAL_COMMUNITY)
Admission: RE | Admit: 2015-01-08 | Discharge: 2015-01-08 | Disposition: A | Discharge status: Home / Self Care | Payer: Medicare HMO | Source: Ambulatory Visit | Attending: Cardiovascular Disease | Admitting: Cardiovascular Disease

## 2015-01-08 DIAGNOSIS — R079 Chest pain, unspecified: Secondary | ICD-10-CM | POA: Insufficient documentation

## 2015-01-08 LAB — MYOCARDIAL PERFUSION IMAGING
CHL CUP NUCLEAR SDS: 3
CHL CUP NUCLEAR SRS: 1
CHL CUP RESTING HR STRESS: 80 {beats}/min
CSEPPHR: 118 {beats}/min
LVDIAVOL: 59 mL
LVSYSVOL: 23 mL
SSS: 4

## 2015-01-08 MED ORDER — TECHNETIUM TC 99M SESTAMIBI GENERIC - CARDIOLITE
10.4000 | Freq: Once | INTRAVENOUS | Status: AC | PRN
  Administered 2015-01-08: 10 via INTRAVENOUS

## 2015-01-08 MED ORDER — AMINOPHYLLINE 25 MG/ML IV SOLN
75.0000 mg | Freq: Once | INTRAVENOUS | Status: AC
  Administered 2015-01-08: 75 mg via INTRAVENOUS

## 2015-01-08 MED ORDER — TECHNETIUM TC 99M SESTAMIBI GENERIC - CARDIOLITE
30.3000 | Freq: Once | INTRAVENOUS | Status: AC | PRN
  Administered 2015-01-08: 30.3 via INTRAVENOUS

## 2015-01-08 MED ORDER — REGADENOSON 0.4 MG/5ML IV SOLN
0.4000 mg | Freq: Once | INTRAVENOUS | Status: AC
  Administered 2015-01-08: 0.4 mg via INTRAVENOUS

## 2015-06-23 DIAGNOSIS — H353190 Nonexudative age-related macular degeneration, unspecified eye, stage unspecified: Secondary | ICD-10-CM

## 2015-06-23 DIAGNOSIS — Z961 Presence of intraocular lens: Secondary | ICD-10-CM

## 2015-06-23 DIAGNOSIS — H43812 Vitreous degeneration, left eye: Secondary | ICD-10-CM | POA: Insufficient documentation

## 2015-06-23 HISTORY — DX: Vitreous degeneration, left eye: H43.812

## 2015-06-23 HISTORY — DX: Nonexudative age-related macular degeneration, unspecified eye, stage unspecified: H35.3190

## 2015-06-23 HISTORY — DX: Presence of intraocular lens: Z96.1

## 2015-07-14 DIAGNOSIS — H353190 Nonexudative age-related macular degeneration, unspecified eye, stage unspecified: Secondary | ICD-10-CM | POA: Diagnosis not present

## 2015-07-14 DIAGNOSIS — Z961 Presence of intraocular lens: Secondary | ICD-10-CM | POA: Diagnosis not present

## 2015-07-14 DIAGNOSIS — H43812 Vitreous degeneration, left eye: Secondary | ICD-10-CM | POA: Diagnosis not present

## 2015-07-14 DIAGNOSIS — H35373 Puckering of macula, bilateral: Secondary | ICD-10-CM | POA: Diagnosis not present

## 2015-07-21 DIAGNOSIS — Z1389 Encounter for screening for other disorder: Secondary | ICD-10-CM | POA: Diagnosis not present

## 2015-07-21 DIAGNOSIS — K589 Irritable bowel syndrome without diarrhea: Secondary | ICD-10-CM | POA: Diagnosis not present

## 2015-07-21 DIAGNOSIS — R002 Palpitations: Secondary | ICD-10-CM | POA: Diagnosis not present

## 2015-07-21 DIAGNOSIS — I10 Essential (primary) hypertension: Secondary | ICD-10-CM | POA: Diagnosis not present

## 2015-07-21 DIAGNOSIS — Z681 Body mass index (BMI) 19 or less, adult: Secondary | ICD-10-CM | POA: Diagnosis not present

## 2015-08-02 DIAGNOSIS — R404 Transient alteration of awareness: Secondary | ICD-10-CM | POA: Diagnosis not present

## 2015-08-02 DIAGNOSIS — R55 Syncope and collapse: Secondary | ICD-10-CM | POA: Diagnosis not present

## 2015-08-03 DIAGNOSIS — Z681 Body mass index (BMI) 19 or less, adult: Secondary | ICD-10-CM | POA: Diagnosis not present

## 2015-08-03 DIAGNOSIS — R55 Syncope and collapse: Secondary | ICD-10-CM | POA: Diagnosis not present

## 2015-08-11 DIAGNOSIS — H6122 Impacted cerumen, left ear: Secondary | ICD-10-CM | POA: Diagnosis not present

## 2015-08-24 DIAGNOSIS — E559 Vitamin D deficiency, unspecified: Secondary | ICD-10-CM | POA: Diagnosis not present

## 2015-08-24 DIAGNOSIS — I10 Essential (primary) hypertension: Secondary | ICD-10-CM | POA: Diagnosis not present

## 2015-08-24 DIAGNOSIS — R808 Other proteinuria: Secondary | ICD-10-CM | POA: Diagnosis not present

## 2015-08-24 DIAGNOSIS — R8299 Other abnormal findings in urine: Secondary | ICD-10-CM | POA: Diagnosis not present

## 2015-08-24 DIAGNOSIS — R55 Syncope and collapse: Secondary | ICD-10-CM | POA: Diagnosis not present

## 2015-08-24 DIAGNOSIS — N390 Urinary tract infection, site not specified: Secondary | ICD-10-CM | POA: Diagnosis not present

## 2015-08-24 DIAGNOSIS — H353190 Nonexudative age-related macular degeneration, unspecified eye, stage unspecified: Secondary | ICD-10-CM | POA: Diagnosis not present

## 2015-08-24 DIAGNOSIS — M810 Age-related osteoporosis without current pathological fracture: Secondary | ICD-10-CM | POA: Diagnosis not present

## 2015-08-24 DIAGNOSIS — F329 Major depressive disorder, single episode, unspecified: Secondary | ICD-10-CM | POA: Diagnosis not present

## 2015-08-24 DIAGNOSIS — Z0000 Encounter for general adult medical examination without abnormal findings: Secondary | ICD-10-CM | POA: Diagnosis not present

## 2015-08-24 DIAGNOSIS — H04129 Dry eye syndrome of unspecified lacrimal gland: Secondary | ICD-10-CM | POA: Diagnosis not present

## 2015-08-24 DIAGNOSIS — G6289 Other specified polyneuropathies: Secondary | ICD-10-CM | POA: Diagnosis not present

## 2015-08-24 DIAGNOSIS — H35373 Puckering of macula, bilateral: Secondary | ICD-10-CM | POA: Diagnosis not present

## 2015-09-08 DIAGNOSIS — Z681 Body mass index (BMI) 19 or less, adult: Secondary | ICD-10-CM | POA: Diagnosis not present

## 2015-09-08 DIAGNOSIS — R55 Syncope and collapse: Secondary | ICD-10-CM | POA: Diagnosis not present

## 2015-09-08 DIAGNOSIS — H8110 Benign paroxysmal vertigo, unspecified ear: Secondary | ICD-10-CM | POA: Diagnosis not present

## 2015-09-08 DIAGNOSIS — I951 Orthostatic hypotension: Secondary | ICD-10-CM | POA: Diagnosis not present

## 2015-09-22 DIAGNOSIS — H35373 Puckering of macula, bilateral: Secondary | ICD-10-CM | POA: Diagnosis not present

## 2015-09-22 DIAGNOSIS — H43812 Vitreous degeneration, left eye: Secondary | ICD-10-CM | POA: Diagnosis not present

## 2015-09-22 DIAGNOSIS — H353190 Nonexudative age-related macular degeneration, unspecified eye, stage unspecified: Secondary | ICD-10-CM | POA: Diagnosis not present

## 2015-09-22 DIAGNOSIS — Z961 Presence of intraocular lens: Secondary | ICD-10-CM | POA: Diagnosis not present

## 2015-11-12 DIAGNOSIS — Z803 Family history of malignant neoplasm of breast: Secondary | ICD-10-CM | POA: Diagnosis not present

## 2015-11-12 DIAGNOSIS — N63 Unspecified lump in breast: Secondary | ICD-10-CM | POA: Diagnosis not present

## 2016-01-06 ENCOUNTER — Telehealth: Payer: Self-pay | Admitting: Cardiology

## 2016-01-06 NOTE — Telephone Encounter (Signed)
New message  Patient c/o Palpitations:  High priority if patient c/o lightheadedness and shortness of breath.  1. How long have you been having palpitations? Pt states about a week  2. Are you currently experiencing lightheadedness and shortness of breath? NO   3. Have you checked your BP and heart rate? (document readings)  Pt states no bp; rate yesterday 117,121, and 129  4. Are you experiencing any other symptoms? No   Please call back to discuss

## 2016-01-06 NOTE — Telephone Encounter (Signed)
Received call from patient.She stated she has been having fast heart been ranging over 100 a lot of days for the past 2 to 3 weeks.No chest pain.No sob.No fast heart beat at present.Appointment scheduled with Cecilie Kicks NP 01/13/16 1:30 pm at [facility].Advised to go to ER if needed.

## 2016-01-07 ENCOUNTER — Encounter: Payer: Self-pay | Admitting: Cardiology

## 2016-01-13 ENCOUNTER — Ambulatory Visit (INDEPENDENT_AMBULATORY_CARE_PROVIDER_SITE_OTHER): Payer: Commercial Managed Care - HMO

## 2016-01-13 ENCOUNTER — Encounter: Payer: Self-pay | Admitting: Cardiology

## 2016-01-13 ENCOUNTER — Ambulatory Visit (INDEPENDENT_AMBULATORY_CARE_PROVIDER_SITE_OTHER): Payer: Commercial Managed Care - HMO | Admitting: Cardiology

## 2016-01-13 VITALS — BP 138/70 | HR 82 | Ht 65.0 in | Wt 121.1 lb

## 2016-01-13 DIAGNOSIS — R000 Tachycardia, unspecified: Secondary | ICD-10-CM | POA: Diagnosis not present

## 2016-01-13 DIAGNOSIS — R079 Chest pain, unspecified: Secondary | ICD-10-CM

## 2016-01-13 DIAGNOSIS — R55 Syncope and collapse: Secondary | ICD-10-CM | POA: Diagnosis not present

## 2016-01-13 DIAGNOSIS — R002 Palpitations: Secondary | ICD-10-CM

## 2016-01-13 LAB — CBC
HEMATOCRIT: 40.3 % (ref 35.0–45.0)
HEMOGLOBIN: 13 g/dL (ref 11.7–15.5)
MCH: 30 pg (ref 27.0–33.0)
MCHC: 32.3 g/dL (ref 32.0–36.0)
MCV: 93.1 fL (ref 80.0–100.0)
MPV: 10.9 fL (ref 7.5–12.5)
Platelets: 319 10*3/uL (ref 140–400)
RBC: 4.33 MIL/uL (ref 3.80–5.10)
RDW: 13.2 % (ref 11.0–15.0)
WBC: 7.1 10*3/uL (ref 3.8–10.8)

## 2016-01-13 NOTE — Progress Notes (Signed)
Cardiology Office Note   Date:  01/13/2016   ID:  Michelle Keith, DOB 1931-10-30, MRN [id number]  PCP:  Jerlyn Ly, MD  Cardiologist:  Dr. Percival Spanish    Chief Complaint  Patient presents with  . Tachycardia      History of Present Illness: Michelle Keith is a 82 y.o. female who presents for tachycardia    She has a history of syncope. She's had a workup that has included a negative stress perfusion study in 2011. Of note there is mention of a possible VSD in her primary care notes. Dr. Percival Spanish reviewed the echo results in 2007 and don't see mention of this. She had a normal echo.  She does have still occasional syncope. She had one episode when she had some GI problems about a year ago. She had one episode when she was standing in line for a long time but she does know her triggers. I did review her previous notes from Dr. [name].  She was thought to have some orthostatic and autonomic syncope.  Also some atrial arrhythmias.   Last year she was seen for chest pain.  She had neg. nuc study.    Today she complains of tachycardia along with elevated BP.  She previously has had tachycardia with hypotension.  Dr. Joylene Draft placed on BB but she had syncope with BB. With the tachycardia she may have chest pressure.  She has had a couple of dreams that she had a horse sitting on chest but when she woke her heart was racing but no chest pressure.  Her daughter is here with her today.  Past Medical History:  Diagnosis Date  . Esophageal stricture   . GERD (gastroesophageal reflux disease)   . Osteopenia   . Right thyroid nodule   . Squamous cell skin cancer   . Syncope     Past Surgical History:  Procedure Laterality Date  . Cataract extraction    . ESOPHAGEAL DILATION     x 4  . Exploratory laparoscopy    . NISSEN FUNDOPLICATION    . TOTAL ABDOMINAL HYSTERECTOMY       Current Outpatient Prescriptions  Medication Sig Dispense Refill  . cholecalciferol (VITAMIN D) 1000 UNITS  tablet Take 1,000 Units by mouth daily.    . diazepam (VALIUM) 10 MG tablet Take 10 mg by mouth 2 (two) times daily as needed.    . Multiple Vitamins-Minerals (PRESERVISION AREDS PO) Take by mouth 2 (two) times daily.    . Omega-3 Fatty Acids (FISH OIL) 1000 MG CAPS Take by mouth.    . pantoprazole (PROTONIX) 40 MG tablet Take 40 mg by mouth daily.    . Probiotic Product (PROBIOTIC DAILY PO) Take by mouth.    . raloxifene (EVISTA) 60 MG tablet Take 60 mg by mouth daily.    . raloxifene (EVISTA) 60 MG tablet Take 60 mg by mouth daily.    Marland Kitchen zolpidem (AMBIEN) 10 MG tablet Take 10 mg by mouth daily as needed.     No current facility-administered medications for this visit.     Allergies:   Aspirin; Clarithromycin; Epinephrine; Lidocaine; Omeprazole magnesium; Penicillins; Sulfonamide derivatives; Tetracycline; Glycol stearate; and Valsartan    Social History:  The patient  reports that she has never smoked. She does not have any smokeless tobacco history on file.   Family History:  The patient's family history includes Heart failure in her mother; Multiple myeloma in her father.    ROS:  General:no colds or  fevers, no weight changes Skin:no rashes or ulcers HEENT:no blurred vision, no congestion CV:see HPI PUL:see HPI GI:no diarrhea constipation or melena, no indigestion GU:no hematuria, no dysuria MS:no joint pain, no claudication Neuro:recent syncope, no lightheadedness Endo:no diabetes, no thyroid disease  Wt Readings from Last 3 Encounters:  01/13/16 121 lb 1.9 oz (54.9 kg)  01/08/15 120 lb (54.4 kg)  11/05/14 120 lb (54.4 kg)     PHYSICAL EXAM: VS:  BP 138/70 (BP Location: Left Arm, Patient Position: Sitting, Cuff Size: Normal)   Pulse 82   Ht _0  (1.651 m)   Wt 121 lb 1.9 oz (54.9 kg)   BMI 20.16 kg/m  , BMI Body mass index is 20.16 kg/m. General:Pleasant affect, NAD Skin:Warm and dry, brisk capillary refill HEENT:normocephalic, sclera clear, mucus membranes  moist Neck:supple, no JVD, no bruits  Heart:S1S2 RRR without murmur, gallup, rub or click Lungs:clear without rales, rhonchi, or wheezes CBU:LAGT, non tender, + BS, do not palpate liver spleen or masses Ext:no lower ext edema, 2+ pedal pulses, 2+ radial pulses Neuro:alert and oriented, MAE, follows commands, + facial symmetry    EKG:  EKG is ordered today. The ekg ordered today demonstrates SR no acute changes.     Recent Labs: No results found for requested labs within last 8760 hours.    Lipid Panel No results found for: CHOL, TRIG, HDL, CHOLHDL, VLDL, LDLCALC, LDLDIRECT     Other studies Reviewed: Additional studies/ records that were reviewed today include: .  Notes Recorded by Minus Breeding, MD on 01/08/2015 at 3:52 PM No perfusion defects suggestive of ischemia. NL EF.  Call Ms. Koc with the results and send results to [facility], MD      Vitals   Height Weight BMI (Calculated)  _1  (1.651 m) 120 lb (54.4 kg) 20  Study Highlights    Nuclear stress EF: 61%.  The left ventricular ejection fraction is normal (55-65%).  The study is normal.  This is a low risk study.   Nl Myoview      ASSESSMENT AND PLAN:   CHEST PAIN:  I think the pretest probability of obstructive coronary disease is low recent nuc study was nec for ischemia.  Tachycardia some may be iregular.  Happens most frequently in the morning . But much more frequent.  Will do 48 hour holter to eval fo rPAF.    DYSAUTONOMIA (ICD-742.8) with syncope   Current medicines are reviewed with the patient today.  The patient Has no concerns regarding medicines.  The following changes have been made:  See above Labs/ tests ordered today include:see above  Disposition:   FU:  see above  Signed, Cecilie Kicks, NP  01/13/2016 2:07 PM    [facility] [place], [place], [address] [place], [place] Phone: [phone];  Fax: [phone]

## 2016-01-13 NOTE — Patient Instructions (Signed)
Michelle Keith recommends that you wear a holter monitor for 48 hours.  Your physician recommends that you return for lab work.  Your physician recommends that you schedule a follow-up appointment in: 2 weeks with Dr Percival Spanish or Michelle Keith.

## 2016-01-14 LAB — BASIC METABOLIC PANEL
BUN: 16 mg/dL (ref 7–25)
CO2: 28 mmol/L (ref 20–31)
Calcium: 9.2 mg/dL (ref 8.6–10.4)
Chloride: 105 mmol/L (ref 98–110)
Creat: 0.74 mg/dL (ref 0.60–0.88)
GLUCOSE: 89 mg/dL (ref 65–99)
POTASSIUM: 4.8 mmol/L (ref 3.5–5.3)
Sodium: 139 mmol/L (ref 135–146)

## 2016-01-14 LAB — MAGNESIUM: MAGNESIUM: 2.4 mg/dL (ref 1.5–2.5)

## 2016-01-14 LAB — TSH: TSH: 3.05 mIU/L

## 2016-01-15 DIAGNOSIS — R002 Palpitations: Secondary | ICD-10-CM | POA: Diagnosis not present

## 2016-01-15 DIAGNOSIS — I10 Essential (primary) hypertension: Secondary | ICD-10-CM | POA: Diagnosis not present

## 2016-01-15 DIAGNOSIS — Z681 Body mass index (BMI) 19 or less, adult: Secondary | ICD-10-CM | POA: Diagnosis not present

## 2016-01-21 DIAGNOSIS — N63 Unspecified lump in breast: Secondary | ICD-10-CM | POA: Diagnosis not present

## 2016-01-22 ENCOUNTER — Telehealth: Payer: Self-pay | Admitting: Cardiology

## 2016-01-22 NOTE — Telephone Encounter (Signed)
New message   Pt is calling for her Monitor results

## 2016-01-22 NOTE — Telephone Encounter (Signed)
Returned call to patient-made aware monitor results have not been read by MD at this time. Advised once the results are read, the primary nurse would give her a call to let her know. Pt verbalized understanding.  Advised to call with further issues or concerns.

## 2016-01-26 ENCOUNTER — Telehealth: Payer: Self-pay | Admitting: Cardiology

## 2016-01-26 ENCOUNTER — Telehealth: Payer: Self-pay | Admitting: *Deleted

## 2016-01-26 NOTE — Telephone Encounter (Signed)
Returned call to patient-made aware of monitor results.  Pt f/u 8/24 with MD Hochrein.    Pt verbalized understanding.  Advised to call with further questions/concerns.

## 2016-01-26 NOTE — Telephone Encounter (Signed)
Monitor results faxed to MD Perini per MD Hochrein.

## 2016-01-26 NOTE — Telephone Encounter (Signed)
New Message ° °Pt returning RN call. Please call back to discuss  °

## 2016-01-28 ENCOUNTER — Encounter: Payer: Self-pay | Admitting: Cardiology

## 2016-02-03 ENCOUNTER — Telehealth: Payer: Self-pay | Admitting: Cardiology

## 2016-02-03 NOTE — Telephone Encounter (Signed)
Closed encounter °

## 2016-02-09 DIAGNOSIS — Z23 Encounter for immunization: Secondary | ICD-10-CM | POA: Diagnosis not present

## 2016-02-11 ENCOUNTER — Ambulatory Visit: Payer: Commercial Managed Care - HMO | Admitting: Cardiology

## 2016-03-09 DIAGNOSIS — F419 Anxiety disorder, unspecified: Secondary | ICD-10-CM | POA: Diagnosis not present

## 2016-03-09 DIAGNOSIS — I10 Essential (primary) hypertension: Secondary | ICD-10-CM | POA: Diagnosis not present

## 2016-03-09 DIAGNOSIS — R5383 Other fatigue: Secondary | ICD-10-CM | POA: Diagnosis not present

## 2016-03-09 DIAGNOSIS — L570 Actinic keratosis: Secondary | ICD-10-CM | POA: Diagnosis not present

## 2016-03-09 DIAGNOSIS — Z681 Body mass index (BMI) 19 or less, adult: Secondary | ICD-10-CM | POA: Diagnosis not present

## 2016-03-09 DIAGNOSIS — R002 Palpitations: Secondary | ICD-10-CM | POA: Diagnosis not present

## 2016-03-09 DIAGNOSIS — H6120 Impacted cerumen, unspecified ear: Secondary | ICD-10-CM | POA: Diagnosis not present

## 2016-03-11 ENCOUNTER — Ambulatory Visit: Payer: Commercial Managed Care - HMO | Admitting: Cardiology

## 2016-03-16 DIAGNOSIS — H524 Presbyopia: Secondary | ICD-10-CM | POA: Diagnosis not present

## 2016-03-16 DIAGNOSIS — H26491 Other secondary cataract, right eye: Secondary | ICD-10-CM | POA: Diagnosis not present

## 2016-03-19 NOTE — Progress Notes (Signed)
Cardiology Office Note   Date:  03/21/2016   ID:  Michelle Keith, DOB [birth year], MRN [id number]  PCP:  Jerlyn Ly, MD  Cardiologist:   Minus Breeding, MD   Chief Complaint  Patient presents with  . Palpitations      History of Present Illness: Michelle Keith is a 82 y.o. female who presents for evaluation of chest pain. She did have a history of syncope. She's had a workup that has included a negative stress perfusion study in 2011. Of note there is mention of a possible VSD in her primary care notes. However, I reviewed the echo results in 2007 and don't see mention of this. She had a normal echo. I saw her last year for evaluation of chest pain.  She had a Lexiscan Myoview negative for ischemia with a normal EF last year.  (Of note, she could not do a POET (Plain Old Exercise Treadmill).  She wore a Holter in July.  There were no significant arrhythmias.  She was having palpitations.    She presents with continued complaints of rapid heart rate and lightheadedness. I reviewed her Holter with her. There are times when she has symptoms that wake her and she is in sinus tachycardia with rates as high as 130. There is no evidence of a supraventricular arrhythmia other than sinus tach.  Will get this sensation of lightheadedness and a rapid heart rate at times when she standing. She's never been able to stand for very long. She did have syncope earlier this year while standing and oftentimes has to get out of line. It has been suggested that she has less stress and is about an antidepressant. She was given a prescription for propranolol take to start this. She had syncope on atenolol in the past.   Past Medical History:  Diagnosis Date  . Esophageal stricture   . GERD (gastroesophageal reflux disease)   . Osteopenia   . Right thyroid nodule   . Squamous cell skin cancer   . Syncope     Past Surgical History:  Procedure Laterality Date  . Cataract extraction    . ESOPHAGEAL  DILATION     x 4  . Exploratory laparoscopy    . NISSEN FUNDOPLICATION    . TOTAL ABDOMINAL HYSTERECTOMY       Current Outpatient Prescriptions  Medication Sig Dispense Refill  . cholecalciferol (VITAMIN D) 1000 UNITS tablet Take 1,000 Units by mouth daily.    . diazepam (VALIUM) 10 MG tablet Take 10 mg by mouth 2 (two) times daily as needed.    . MECLIZINE HCL PO Take 1 tablet by mouth daily as needed.    . mirtazapine (REMERON) 15 MG tablet Pt take 1/4 tablet daily    . pantoprazole (PROTONIX) 40 MG tablet Take 40 mg by mouth daily.    . Probiotic Product (PROBIOTIC DAILY PO) Take by mouth.    . raloxifene (EVISTA) 60 MG tablet Take 60 mg by mouth daily.    Marland Kitchen zolpidem (AMBIEN) 10 MG tablet Take 10 mg by mouth daily as needed.    . pindolol (VISKEN) 5 MG tablet Take 0.5 tablets (2.5 mg total) by mouth every morning. 30 tablet 5   No current facility-administered medications for this visit.     Allergies:   Aspirin; Clarithromycin; Epinephrine; Lidocaine; Omeprazole magnesium; Penicillins; Sulfonamide derivatives; Tetracycline; Glycol stearate; and Valsartan    ROS:  Please see the history of present illness.   Otherwise, review of systems  are positive for none.   All other systems are reviewed and negative.    PHYSICAL EXAM: VS:  BP (!) 151/89   Pulse 91   Ht 5' 5.5" (1.664 m)   Wt 121 lb 6.4 oz (55.1 kg)   SpO2 95%   BMI 19.89 kg/m  , BMI Body mass index is 19.89 kg/m. GENERAL:  Well appearing NECK:  No jugular venous distention, waveform within normal limits, carotid upstroke brisk and symmetric, no bruits, no thyromegaly LUNGS:  Clear to auscultation bilaterally BACK:  No CVA tenderness CHEST:  Unremarkable HEART:  PMI not displaced or sustained,S1 and S2 within normal limits, no S3, no S4, no clicks, no rubs, no murmurs ABD:  Flat, positive bowel sounds normal in frequency in pitch, no bruits, no rebound, no guarding, no midline pulsatile mass, no hepatomegaly, no  splenomegaly EXT:  2 plus pulses throughout, no edema, no cyanosis no clubbing   EKG:  EKG is not  ordered today.  Recent Labs: 01/13/2016: BUN 16; Creat 0.74; Hemoglobin 13.0; Magnesium 2.4; Platelets 319; Potassium 4.8; Sodium 139; TSH 3.05    Lipid Panel No results found for: CHOL, TRIG, HDL, CHOLHDL, VLDL, LDLCALC, LDLDIRECT    Wt Readings from Last 3 Encounters:  03/21/16 121 lb 6.4 oz (55.1 kg)  01/13/16 121 lb 1.9 oz (54.9 kg)  01/08/15 120 lb (54.4 kg)    Lab Results  Component Value Date   TSH 3.05 01/13/2016     Other studies Reviewed: Additional studies/ records that were reviewed today include: .Holter Review of the above records demonstrates:  Please see elsewhere in the note.     ASSESSMENT AND PLAN:   PALPITATIONS:  The patient did not have orthostatic blood pressure drop but she did have an increased heart rate at 3 minutes of standing. She was having symptoms with this. She clearly has sinus tachycardia. She's going to be treated for depression and anxiety and I agree with this. We talked about compression stockings and she may have some component of orthostasis and may have also POTS.  She's been very sensitive to medications and is leery of taking anything but does not want to continue with the tachycardia palpitations. In addition her blood pressures elevated as below. I'm going to start with a low dose of pindolol 2.5 mg in the morning only. This drop was sympathomimetic activity may cause less bradycardia arrhythmias.  HTN: Her blood pressures typically creeping up and running higher. I'll address this as above.     Current medicines are reviewed at length with the patient today.  The patient does not have concerns regarding medicines.  The following changes have been made:  As above  Labs/ tests ordered today include:   No orders of the defined types were placed in this encounter.    Disposition:   FU with me in one month.    Signed, Minus Breeding, MD  03/21/2016 11:05 AM    [place]

## 2016-03-21 ENCOUNTER — Encounter: Payer: Self-pay | Admitting: Cardiology

## 2016-03-21 ENCOUNTER — Ambulatory Visit (INDEPENDENT_AMBULATORY_CARE_PROVIDER_SITE_OTHER): Payer: Commercial Managed Care - HMO | Admitting: Cardiology

## 2016-03-21 VITALS — BP 151/89 | HR 91 | Ht 65.5 in | Wt 121.4 lb

## 2016-03-21 DIAGNOSIS — R002 Palpitations: Secondary | ICD-10-CM | POA: Diagnosis not present

## 2016-03-21 DIAGNOSIS — R55 Syncope and collapse: Secondary | ICD-10-CM | POA: Diagnosis not present

## 2016-03-21 MED ORDER — PINDOLOL 5 MG PO TABS: 2.5000 mg | ORAL_TABLET | ORAL | 5 refills | Status: DC

## 2016-03-21 NOTE — Patient Instructions (Signed)
Medication Instructions:  START- Pindolol 2.5 mg daily in the morning  Labwork: None Ordered  Testing/Procedures: None Ordered  Follow-Up: Your physician recommends that you schedule a follow-up appointment in: 1 Month.   Any Other Special Instructions Will Be Listed Below (If Applicable).   If you need a refill on your cardiac medications before your next appointment, please call your pharmacy.

## 2016-03-22 ENCOUNTER — Telehealth: Payer: Self-pay | Admitting: Cardiology

## 2016-03-22 NOTE — Telephone Encounter (Signed)
Spoke to patient. Informed her of instruction . Verbalized understanding.

## 2016-03-22 NOTE — Telephone Encounter (Signed)
Change to one quarter tablet daily.

## 2016-03-22 NOTE — Telephone Encounter (Signed)
New message      Pt was seen yesterday.  She has questions regarding her bp medication

## 2016-03-22 NOTE — Telephone Encounter (Signed)
New message      Pt is calling to see if you heard back from Dr Percival Spanish?  Please call before you leave today

## 2016-03-22 NOTE — Telephone Encounter (Signed)
SPOKE TO PATIENT. She states she wanted Dr Percival Spanish  To know she took pindolol 2.5 mg this morning at 6 am.  prior to taking medication blood pressure was 182/1114 pulse 108 . At 8 am blood pressure  136/79 pulse 70. Patient states she felt very incapacitated after taking the medication at 6 am. - ( sick ,nausea). She states she did not eat anything and that the medication did not indicate she needed any food with medications. She states Dr Percival Spanish told her take the medicine when she awaken in the morning.  took blood pressure at 9 am   Normal now did not give numbers  patient had to 2 question 1- if the medicine is out of her system by 12 noon  Then why would her blood pressure be high in the morning?  2- Does Dr Percival Spanish think taking a 1/4 of a tablet would do the job,since she had such a reaction to pindolol 2.5 mg ( 1/2 tablet of 5 mg)? Patient states she is feeling fine now but concerned about this evening dose. She will not be home until 4 :45 pm today   aware will defer to Dr Percival Spanish

## 2016-03-29 ENCOUNTER — Telehealth: Payer: Self-pay | Admitting: Cardiology

## 2016-03-29 NOTE — Telephone Encounter (Signed)
Please call,question about her medicine.Please call today if possible.

## 2016-03-29 NOTE — Telephone Encounter (Signed)
Seen 1 week ago by Dr. Percival Spanish. Was prescribed pindolol and started same day.  Pt notes she was  having BP & HR problems -- but notes she is getting a good response w pindolol, especially in regards to her HR. Notes the pindolol seemed to have fixed her tachycardia issue, but she wants to know if something else recommended to resolve her high AM pressures.  BP in AM before taking the pindolol high (170/93 today) at night runs lower (115/63 last night) notes this fluctuation is consistent with BPs she was getting even prior to starting on Pindolol. However, she also notes that once she takes the pindolol in the AM, her BP resolves to [id number] systolic for most of the day. By evening, usually systolic around 0000000.  We discussed possibility of adding a med to her evening regimen & that I could have pharmD review. Pt would like Dr. Rosezella [place] feedback on this if possible, as she is very wary of starting new meds due to sensitivity.  Pt aware I will be out tomorrow but can have another nurse follow up with her or I can return her call later in the week. She voiced thanks and appreciation for discussion. Will route for advice.

## 2016-03-30 NOTE — Telephone Encounter (Signed)
She may benefit from BID pindolol - since she is taking 1/4-5mg  tablet daily we could try 1/4-5mg  tablet (1.25mg ) BID and see if this will control her pressures in the morning with out causing additional symptoms.

## 2016-03-31 NOTE — Telephone Encounter (Signed)
Spoke w patient, recommendations discussed in detail. She decided she will try to take a dose of the pindolol at night and call if issues. She voiced understanding and thanks. She denied need for refill at this time. Will update dosage w instruction for pharmacy to fill later.

## 2016-03-31 NOTE — Telephone Encounter (Signed)
Agree with plans

## 2016-04-03 ENCOUNTER — Telehealth: Payer: Self-pay | Admitting: Cardiology

## 2016-04-03 NOTE — Telephone Encounter (Signed)
Pt called stating she has had diarrhea everyday she thinks since starting pindolol for her palpitations. Reports having a problem with diarrhea in the past and having seen a GI MD for the same. I informed her that it was unlikely her symptoms were related to the BB, but if needed she could hold her dose today and see if her symptoms improve. Instructed she may need to follow up with GI MD if her symptoms persist. The patient was agreeable to this plan and thanked me for the call.   Reino Bellis NP-C

## 2016-04-04 ENCOUNTER — Telehealth: Payer: Self-pay | Admitting: Cardiology

## 2016-04-04 NOTE — Telephone Encounter (Signed)
Spoke with patient   several appointment given with htn clinic - a conflict with [id number], [id number]  schedule with extender 10/19 - will able to cofer with pharmacist if necessary .  patient aware and in agreement

## 2016-04-04 NOTE — Telephone Encounter (Signed)
This is too difficult to treat over the phone because of multiple med intolerances.  She will need a follow up appt with me or in the HTN clinic of [facility].

## 2016-04-04 NOTE — Telephone Encounter (Signed)
Forward to Dr hochrein  see last telephone message

## 2016-04-04 NOTE — Telephone Encounter (Signed)
Pt needs to be called after 4:15pm Dr.Hochrien pt her on new BP medication and she is unable to take them. She is having diarrhea everyday she would like something else/.

## 2016-04-07 ENCOUNTER — Ambulatory Visit (INDEPENDENT_AMBULATORY_CARE_PROVIDER_SITE_OTHER): Payer: Commercial Managed Care - HMO | Admitting: Physician Assistant

## 2016-04-07 ENCOUNTER — Encounter: Payer: Self-pay | Admitting: Physician Assistant

## 2016-04-07 VITALS — BP 148/90 | HR 91 | Ht 65.0 in | Wt 121.6 lb

## 2016-04-07 DIAGNOSIS — I10 Essential (primary) hypertension: Secondary | ICD-10-CM | POA: Diagnosis not present

## 2016-04-07 DIAGNOSIS — I951 Orthostatic hypotension: Secondary | ICD-10-CM | POA: Diagnosis not present

## 2016-04-07 MED ORDER — METOPROLOL TARTRATE 25 MG PO TABS: 12.5000 mg | ORAL_TABLET | Freq: Every day | ORAL | 3 refills | Status: DC

## 2016-04-07 NOTE — Progress Notes (Signed)
Cardiology Office Note   Date:  04/07/2016   ID:  Michelle Keith, DOB 07-Oct-1931, MRN [id number]  PCP:  Jerlyn Ly, MD  Cardiologist:  Dr Mardene Celeste, PA-C   Chief Complaint  Patient presents with  . Hypertension    History of Present Illness: Michelle Keith is a 82 y.o. female with a history of HTN, GERD, syncope, R thyroid nodule.   Pt taking pindolol 1/4 of 5 mg tab, BP not controlled. Asked to take 1/4 tab bid, pt developed diarrhea.   Michelle Keith presents for Further evaluation and management of her blood pressure.  She brings in a sheet of blood pressure readings. Consistencies are noted. Every morning, her blood pressure is elevated. By late morning and on through the rest of the day, her blood pressure is much better control. As she is not currently taking any medications for her blood pressure, the reason for this is unclear. When she goes to bed at night, her blood pressure is well controlled but it is high again when she wakes up in the morning.  She has had multiple medication intolerances. With the pindolol, she developed diarrhea. There was no nausea or vomiting or other GI symptoms. She had the diarrhea despite decreasing the pindolol down to one fourth tablet daily. After she stopped the pindolol, the diarrhea resolved.  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 Ever since then, she has had problems with standing for prolonged periods of time. She is good for about 10 minutes, but we'll then begin to have problems with being lightheaded and dizzy. She doesn't sit down, she will fall. Once she falls, her symptoms will improve and she is able to get up and move around again.  She gets tachycardic at times. When her heart rate gets up to about 110, she feels poorly. She will feel weak and have to lie down. She is frustrated that her side effects with multiple medications make it difficult to adjust things to better control her  blood pressure. However, she is also worried bout her blood pressure being high in the morning. She wants to find something that we will not only help control her blood pressure, but help control her heart rate.  She has not had exertional chest pain, or shortness of breath. No lower extremity edema, no orthopnea, or PND.   Past Medical History:  Diagnosis Date  . Esophageal stricture   . GERD (gastroesophageal reflux disease)   . Osteopenia   . Right thyroid nodule   . Squamous cell skin cancer   . Syncope     Past Surgical History:  Procedure Laterality Date  . Cataract extraction    . ESOPHAGEAL DILATION     x 4  . Exploratory laparoscopy    . NISSEN FUNDOPLICATION    . TOTAL ABDOMINAL HYSTERECTOMY      Current Outpatient Prescriptions  Medication Sig Dispense Refill  . cholecalciferol (VITAMIN D) 1000 UNITS tablet Take 1,000 Units by mouth daily.    . diazepam (VALIUM) 10 MG tablet Take 10 mg by mouth 2 (two) times daily as needed.    . MECLIZINE HCL PO Take 1 tablet by mouth daily as needed.    . mirtazapine (REMERON) 15 MG tablet Pt take 1/4 tablet daily    . Multiple Vitamins-Minerals (ICAPS AREDS 2 PO) Take 1 capsule by mouth 2 (two) times daily.    . pantoprazole (PROTONIX) 40 MG tablet Take  40 mg by mouth daily.    . Probiotic Product (PROBIOTIC DAILY PO) Take by mouth.    . raloxifene (EVISTA) 60 MG tablet Take 60 mg by mouth daily.    Marland Kitchen zolpidem (AMBIEN) 10 MG tablet Take 10 mg by mouth daily as needed.     No current facility-administered medications for this visit.     Allergies:   Aspirin; Clarithromycin; Epinephrine; Lidocaine; Omeprazole magnesium; Penicillins; Pindolol; Sulfonamide derivatives; Tetracycline; Glycol stearate; and Valsartan    Social History:  The patient  reports that she has never smoked. She has never used smokeless tobacco.   Family History:  The patient's family history includes Heart failure in her mother; Multiple myeloma in her  father.    ROS:  Please see the history of present illness. All other systems are reviewed and negative.    PHYSICAL EXAM: VS:  BP (!) 148/90   Pulse 91   Ht '5\' 5"'  (1.651 m)   Wt 121 lb 9.6 oz (55.2 kg)   BMI 20.24 kg/m  , BMI Body mass index is 20.24 kg/m. GEN: Well nourished, well developed, female in no acute distress  HEENT: normal for age  Neck: no JVD, no carotid bruit, no masses Cardiac: RRR; soft murmur, no rubs, or gallops Respiratory:  clear to auscultation bilaterally, normal work of breathing GI: soft, nontender, nondistended, + BS MS: no deformity or atrophy; no edema; distal pulses are 2+ in all 4 extremities   Skin: warm and dry, no rash Neuro:  Strength and sensation are intact Psych: euthymic mood, full affect   EKG:  EKG is not ordered today.  Recent Labs: 01/13/2016: BUN 16; Creat 0.74; Hemoglobin 13.0; Magnesium 2.4; Platelets 319; Potassium 4.8; Sodium 139; TSH 3.05    Lipid Panel No results found for: CHOL, TRIG, HDL, CHOLHDL, VLDL, LDLCALC, LDLDIRECT   Wt Readings from Last 3 Encounters:  04/07/16 121 lb 9.6 oz (55.2 kg)  03/21/16 121 lb 6.4 oz (55.1 kg)  01/13/16 121 lb 1.9 oz (54.9 kg)     Other studies Reviewed: Additional studies/ records that were reviewed today include: Office notes and other records.  ASSESSMENT AND PLAN:  1.  Hypertension: Long discussion (greater than 45 minutes) with the patient regarding the fact that; of the medicines that can help control her blood pressure, the calcium channel blockers and beta blockers are the ones that can also control her heart rate.   I understand that she has had problems with one kind of beta blocker, but the beta blocker spectrum has pindolol at one end, and metoprolol at the other end. She took atenolol, but had problems with that because she was taking it during the day, and her blood pressure is generally well-controlled during the day. Her blood pressure is generally elevated only first  thing in the morning.   Therefore, I suggested that she try metoprolol at the lowest possible dose, 12.5 mg, and take that only at night. I advised her that I understand she has had problems with multiple medications, but if this does not work, then we will eliminate all beta blockers as possibilities. Once we eliminate beta blockers, we can try some of the rate lowering calcium channel blockers. If those do not work, we will have to go to medications that do not affect her heart rate such as a low dose of amlodipine, once again taken only at night.   She has no history of hyponatremia or hypokalemia, so I do not feel an aldosterone  level of cortisol stimulation test is indicated at this time.  She could also be referred to our pharmacist for blood pressure control, if her blood pressure does not respond easily to the medication changes planned.  2. Orthostatic hypotension: This is a chronic problem for her. Because her blood pressure is elevated at times, and do not feel that admitted 3 or 4 and it is needed. She is encouraged to get compression stockings which have been recommended to her before, and wear them. She was given the address of the place in [place] where these can be obtained.    Current medicines are reviewed at length with the patient today.  The patient has concerns regarding medicines. Concerns were addressed  The following changes have been made:  *Metoprolol at 12.5 mg daily at bedtime  Labs/ tests ordered today include:  No orders of the defined types were placed in this encounter.    Disposition:   FU with Dr. Percival Spanish  Signed, Rosaria Ferries, PA-C  04/07/2016 11:28 AM    [place] Phone: [phone]; Fax: [phone]  This note was written with the assistance of speech recognition software. Please excuse any transcriptional errors.

## 2016-04-07 NOTE — Patient Instructions (Signed)
Medications:  START Metoprolol 25 mg--1/2 tablet daily at bedtime. --Call if you experience side effects.   Other Instructions:  Get Compression Stockings for swelling:  [employer] [facility] [phone]   Follow-Up:  Your physician wants you to follow-up in: 6 months with Dr. Percival Spanish or sooner if you need medication adjustments.  You will receive a reminder letter in the mail two months in advance. If you don't receive a letter, please call our office to schedule the follow-up appointment. If you need a refill on your cardiac medications before your next appointment, please call your pharmacy.

## 2016-04-15 ENCOUNTER — Telehealth: Payer: Self-pay | Admitting: Physician Assistant

## 2016-04-15 NOTE — Telephone Encounter (Signed)
Spoke with pt states that since she has started her metoprolol her HR has been doing good but not her BP vs  have been running 147/74, 143/73 and heart rate has been ranging in the 70's, upper 70-85 and today it was 152/82 hr 87, then 167/84 dr 94. pt states that she will he at home until 9:45 after that will be back home until 4:30.    Will route message to [facility], Please advise

## 2016-04-15 NOTE — Telephone Encounter (Signed)
Pt keep saying she needs to talk to [place]. She said she gave her a medicine and it is not working and she knows Suanne Marker would want to talk to her about this. If you can not get her to call in the next hour,have her call her after 4:30 today please.

## 2016-04-18 NOTE — Telephone Encounter (Signed)
NOTED

## 2016-04-18 NOTE — Telephone Encounter (Signed)
Spoke w/ patient on the phone. Requested she take the metoprolol 12.5 mg bid. She discussed the S.E. Of the antidepressants but I requested she discuss these with her primary MD. She agrees to take the BB bid.

## 2016-04-19 DIAGNOSIS — H6122 Impacted cerumen, left ear: Secondary | ICD-10-CM | POA: Diagnosis not present

## 2016-04-21 NOTE — Progress Notes (Signed)
Cardiology Office Note   Date:  04/22/2016   ID:  Michelle Keith, DOB 09-Apr-1932, MRN [id number]  PCP:  Michelle Ly, MD  Cardiologist:   Minus Breeding, MD   Chief Complaint  Patient presents with  . Hypertension  . Palpitations     History of Present Illness: Michelle Keith is a 82 y.o. female who presents for evaluation of chest pain. She did have a history of syncope. She's had a workup that has included a negative stress perfusion study in 2011. Of note there is mention of a possible VSD in her primary care notes. However, I reviewed the echo results in 2007 and don't see mention of this. She had a normal echo. I saw her last year for evaluation of chest pain.  She had a Lexiscan Myoview negative for ischemia with a normal EF last year.  (Of note, she could not do a POET (Plain Old Exercise Treadmill).  She wore a Holter in July.  There were no significant arrhythmias.  She was having palpitations.  She has been very intolerant to meds.  I tried Pindolol but she couldn't take this.  She came back and saw Rosaria Ferries and was started on metoprolol.  She's done very well on this regimen. She's not having the palpitations. Her heart rate is come way down. She is having diarrhea about once every fifth day. She can tolerate this. Her blood pressures are better controlled I reviewed a diary.  Her blood pressures are in the 160s to 170s in the morning to come down during the day. She's not having any presyncope or syncope.   Past Medical History:  Diagnosis Date  . Esophageal stricture   . GERD (gastroesophageal reflux disease)   . Osteopenia   . Right thyroid nodule   . Squamous cell skin cancer   . Syncope     Past Surgical History:  Procedure Laterality Date  . Cataract extraction    . ESOPHAGEAL DILATION     x 4  . Exploratory laparoscopy    . NISSEN FUNDOPLICATION    . TOTAL ABDOMINAL HYSTERECTOMY       Current Outpatient Prescriptions  Medication Sig Dispense Refill   . cholecalciferol (VITAMIN D) 1000 UNITS tablet Take 1,000 Units by mouth daily.    . diazepam (VALIUM) 10 MG tablet Take 10 mg by mouth 2 (two) times daily as needed.    . MECLIZINE HCL PO Take 1 tablet by mouth daily as needed.    . metoprolol tartrate (LOPRESSOR) 25 MG tablet Take 0.5 tablets (12.5 mg total) by mouth at bedtime. 45 tablet 3  . mirtazapine (REMERON) 15 MG tablet Pt take 1/4 tablet daily    . Multiple Vitamins-Minerals (ICAPS AREDS 2 PO) Take 1 capsule by mouth 2 (two) times daily.    . pantoprazole (PROTONIX) 40 MG tablet Take 40 mg by mouth daily.    . Probiotic Product (PROBIOTIC DAILY PO) Take by mouth.    . raloxifene (EVISTA) 60 MG tablet Take 60 mg by mouth daily.    Marland Kitchen zolpidem (AMBIEN) 10 MG tablet Take 10 mg by mouth daily as needed.     No current facility-administered medications for this visit.     Allergies:   Aspirin; Clarithromycin; Epinephrine; Lidocaine; Omeprazole magnesium; Penicillins; Pindolol; Sulfonamide derivatives; Tetracycline; Glycol stearate; and Valsartan    ROS:  Please see the history of present illness.   Otherwise, review of systems are positive for none.   All other  systems are reviewed and negative.    PHYSICAL EXAM: VS:  BP 140/68   Pulse 73   Ht 5\' 5"  (1.651 m)   Wt 122 lb (55.3 kg)   BMI 20.30 kg/m  , BMI Body mass index is 20.3 kg/m. GENERAL:  Well appearing NECK:  No jugular venous distention, waveform within normal limits, carotid upstroke brisk and symmetric, no bruits, no thyromegaly LUNGS:  Clear to auscultation bilaterally BACK:  No CVA tenderness CHEST:  Unremarkable HEART:  PMI not displaced or sustained,S1 and S2 within normal limits, no S3, no S4, no clicks, no rubs, no murmurs ABD:  Flat, positive bowel sounds normal in frequency in pitch, no bruits, no rebound, no guarding, no midline pulsatile mass, no hepatomegaly, no splenomegaly EXT:  2 plus pulses throughout, no edema, no cyanosis no clubbing   EKG:  EKG  is not  ordered today.  Recent Labs: 01/13/2016: BUN 16; Creat 0.74; Hemoglobin 13.0; Magnesium 2.4; Platelets 319; Potassium 4.8; Sodium 139; TSH 3.05    Lipid Panel No results found for: CHOL, TRIG, HDL, CHOLHDL, VLDL, LDLCALC, LDLDIRECT    Wt Readings from Last 3 Encounters:  04/22/16 122 lb (55.3 kg)  04/07/16 121 lb 9.6 oz (55.2 kg)  03/21/16 121 lb 6.4 oz (55.1 kg)    Lab Results  Component Value Date   TSH 3.05 01/13/2016     Other studies Reviewed: Additional studies/ records that were reviewed today include: None Review of the above records demonstrates:     ASSESSMENT AND PLAN:   PALPITATIONS:  She is doing much better.  She will continue current therapy.    HTN: Her blood pressure is elevated in the AM.  However, it comes down during the day. She is very sensitive to medications and I would not try to titrate this further. She will continue on the meds as listed.   Current medicines are reviewed at length with the patient today.  The patient does not have concerns regarding medicines.  The following changes have been made:  None  Labs/ tests ordered today include:   No orders of the defined types were placed in this encounter.    Disposition:   FU with Rhonda Barrett in 2 months.    Signed, Minus Breeding, MD  04/22/2016 1:12 PM     [facility]

## 2016-04-22 ENCOUNTER — Encounter (INDEPENDENT_AMBULATORY_CARE_PROVIDER_SITE_OTHER): Payer: Self-pay

## 2016-04-22 ENCOUNTER — Ambulatory Visit (INDEPENDENT_AMBULATORY_CARE_PROVIDER_SITE_OTHER): Payer: Commercial Managed Care - HMO | Admitting: Cardiology

## 2016-04-22 ENCOUNTER — Encounter: Payer: Self-pay | Admitting: Cardiology

## 2016-04-22 VITALS — BP 140/68 | HR 73 | Ht 65.0 in | Wt 122.0 lb

## 2016-04-22 DIAGNOSIS — I10 Essential (primary) hypertension: Secondary | ICD-10-CM

## 2016-04-22 DIAGNOSIS — R002 Palpitations: Secondary | ICD-10-CM | POA: Diagnosis not present

## 2016-04-22 NOTE — Patient Instructions (Signed)
Medication Instructions:  Continue current medications  Labwork: None Ordered  Testing/Procedures: None Ordered  Follow-Up: Your physician recommends that you schedule a follow-up appointment in: 2 Months with Rosaria Ferries   Any Other Special Instructions Will Be Listed Below (If Applicable).                                         Happy Thanksgiving  If you need a refill on your cardiac medications before your next appointment, please call your pharmacy.

## 2016-04-25 ENCOUNTER — Telehealth: Payer: Self-pay | Admitting: Cardiology

## 2016-04-25 NOTE — Telephone Encounter (Signed)
Returned call to patient.She stated she wanted to let Rosaria Ferries PA know she passed out yesterday.Stated she was sitting on commode after having diarrhea became sick, woke up on floor of bathroom.Stated she held morning dose of metoprolol this morning,she feels ok today.Stated she is very sensitive to medication.Stated she would like to speak to [place] this afternoon.Advised I will speak to [facility] and call her back. Spoke to [employer] PA she advised to take pm dose of metoprolol and hold morning dose for a few days,if she feels like she wants to try restarting morning dose every other morning or take morning dose 3 times a week.Advised to see how her body feels and take morning dose as tolerated.Patient stated she will probably just hold morning dose and continue taking pm dose.Stated she will keep appointment with Suanne Marker 06/23/16 at 10:00 am.She will call back sooner if needed.

## 2016-04-25 NOTE — Telephone Encounter (Signed)
Left msg to call.

## 2016-04-25 NOTE — Telephone Encounter (Signed)
Follow up ° ° ° ° °Returned a call to the nurse °

## 2016-04-25 NOTE — Telephone Encounter (Signed)
Call dropped when on phone w patient to discuss recommendations. 2nd call out to patient also dropped. Will reattempt.

## 2016-04-25 NOTE — Telephone Encounter (Signed)
Patient says that she fainted 11-5-yesterday and wonders if it is her BP med. Pls call pt

## 2016-04-25 NOTE — Telephone Encounter (Signed)
pt reports episode of diarrhea while at husband's nursing home yesterday, explains that she went to bathroom, sat on toilet, had BM. Afterwards continued to feel "unwell" - was leaning over, and then remembers waking up on bathroom floor. Denies injury, pain.  She skipped her metoprolol tartrate dose this morning. Notes she doesn't think this does much for her BP anyway, but does help her HR.  Had a previous syncopal episode while sitting in church. pt fearful about taking the metoprolol during the day. wants to avoid taking this in the daytime but OK to take during the day.  She requested recommendation from provider.   Pt aware I will relay to [employer]. She was just seen on 11/3 and Dr. Percival Spanish advised no changes to meds from previous visit (10/19 w Suanne Marker).   She declined a PA visit today - notes difficulty in making it to appts, and would like advice from home if possible.

## 2016-04-25 NOTE — Telephone Encounter (Signed)
Ok to hold metoprolol; fu with Dr Percival Spanish or PA Kirk Ruths

## 2016-05-05 DIAGNOSIS — F418 Other specified anxiety disorders: Secondary | ICD-10-CM | POA: Diagnosis not present

## 2016-05-05 DIAGNOSIS — R002 Palpitations: Secondary | ICD-10-CM | POA: Diagnosis not present

## 2016-05-05 DIAGNOSIS — R55 Syncope and collapse: Secondary | ICD-10-CM | POA: Diagnosis not present

## 2016-05-05 DIAGNOSIS — M791 Myalgia: Secondary | ICD-10-CM | POA: Diagnosis not present

## 2016-05-05 DIAGNOSIS — Z6820 Body mass index (BMI) 20.0-20.9, adult: Secondary | ICD-10-CM | POA: Diagnosis not present

## 2016-05-05 DIAGNOSIS — I10 Essential (primary) hypertension: Secondary | ICD-10-CM | POA: Diagnosis not present

## 2016-05-20 ENCOUNTER — Telehealth: Payer: Self-pay | Admitting: Physician Assistant

## 2016-05-20 NOTE — Telephone Encounter (Signed)
Received records from [facility] for appointment on 06/23/16 with Rosaria Ferries, PA.  Records given to [employer] (medical records) for Rhonda's schedule on 06/23/16. lp

## 2016-06-02 DIAGNOSIS — Z01419 Encounter for gynecological examination (general) (routine) without abnormal findings: Secondary | ICD-10-CM | POA: Diagnosis not present

## 2016-06-02 DIAGNOSIS — Z124 Encounter for screening for malignant neoplasm of cervix: Secondary | ICD-10-CM | POA: Diagnosis not present

## 2016-06-03 DIAGNOSIS — M7061 Trochanteric bursitis, right hip: Secondary | ICD-10-CM | POA: Diagnosis not present

## 2016-06-16 DIAGNOSIS — M7061 Trochanteric bursitis, right hip: Secondary | ICD-10-CM | POA: Diagnosis not present

## 2016-06-23 ENCOUNTER — Encounter: Payer: Self-pay | Admitting: Physician Assistant

## 2016-06-23 ENCOUNTER — Ambulatory Visit (INDEPENDENT_AMBULATORY_CARE_PROVIDER_SITE_OTHER): Payer: Medicare HMO | Admitting: Physician Assistant

## 2016-06-23 VITALS — BP 160/100 | HR 81 | Ht 65.0 in | Wt 127.3 lb

## 2016-06-23 DIAGNOSIS — I10 Essential (primary) hypertension: Secondary | ICD-10-CM | POA: Diagnosis not present

## 2016-06-23 DIAGNOSIS — R002 Palpitations: Secondary | ICD-10-CM

## 2016-06-23 MED ORDER — DILTIAZEM HCL 30 MG PO TABS: 30.0000 mg | ORAL_TABLET | Freq: Three times a day (TID) | ORAL | 11 refills | Status: DC

## 2016-06-23 NOTE — Progress Notes (Signed)
Cardiology Office Note   Date:  06/23/2016   ID:  Michelle Keith, DOB 10-11-1931, MRN [id number]  PCP:  Jerlyn Ly, MD  Cardiologist:  Dr Percival Spanish 04/22/2016 Rosaria Ferries, PA-C 04/07/2016  Chief Complaint  Patient presents with  . Follow-up    pt has no complaints     History of Present Illness: Michelle Keith is a 83 y.o. female with a history of  HTN, GERD, syncope, R thyroid nodule, vagus nerve problems after Nissen procedure (she gets light-headed with standing), neg MV 2016 w/ EF 61%.   10/19, officevisit with me for HTN, orthostatic sx, diarrhea w/ pindolol, intolerant atenolol, tried metoprolol. Problems w/ tachycardia, change to CCB if BB not tolerated. 11/03, Dr Percival Spanish saw for CP, palps and BP were better, no further eval. 11/06 phone notes w/ near-syncope>>am BB dose held 11/16 PCP saw, pt described near-syncope in setting of diarrhea and had palpitations as well. Palps worse after she stopped the BB, was encouraged to keep pm dose.    Michelle Keith presents for follow up and BP management.  2-3 weeks later, saw PCP, she had had an episode of weakness and diaphoresis, also palpitations.   Her BP is still poorly controlled in the morning, as high as 183/100, but by the evening it is much lower, as low as 109/62. Her heart rate goes up very easily doing things around the house, as high as 118 with regular household activities. She drinks at least 3 glasses of water before/with breakfast and 8 during the day. She does not get light-headed with position change.   She has been having R hip pain, is seeing [facility] for this.    Past Medical History:  Diagnosis Date  . Esophageal stricture   . GERD (gastroesophageal reflux disease)   . Osteopenia   . Right thyroid nodule   . Squamous cell skin cancer   . Syncope     Past Surgical History:  Procedure Laterality Date  . Cataract extraction    . ESOPHAGEAL DILATION     x 4  . Exploratory laparoscopy    .  NISSEN FUNDOPLICATION    . TOTAL ABDOMINAL HYSTERECTOMY     5 Current Outpatient Prescriptions  Medication Sig Dispense Refill  . cholecalciferol (VITAMIN D) 1000 UNITS tablet Take 1,000 Units by mouth daily.    . diazepam (VALIUM) 10 MG tablet Take 10 mg by mouth 2 (two) times daily as needed.    . MECLIZINE HCL PO Take 1 tablet by mouth daily as needed.    . metoprolol tartrate (LOPRESSOR) 25 MG tablet Take 0.5 tablets (12.5 mg total) by mouth at bedtime. (Patient taking differently: Take 12.5 mg by mouth at bedtime. Patient taking 1/4 tablet daily) 45 tablet 3  . mirtazapine (REMERON) 15 MG tablet Pt take 1/4 tablet daily    . Multiple Vitamins-Minerals (ICAPS AREDS 2 PO) Take 1 capsule by mouth 2 (two) times daily.    . pantoprazole (PROTONIX) 40 MG tablet Take 40 mg by mouth daily.    . Probiotic Product (PROBIOTIC DAILY PO) Take by mouth.    . raloxifene (EVISTA) 60 MG tablet Take 60 mg by mouth daily.    Marland Kitchen zolpidem (AMBIEN) 10 MG tablet Take 10 mg by mouth daily as needed.     No current facility-administered medications for this visit.     Allergies:   Aspirin; Clarithromycin; Epinephrine; Lidocaine; Omeprazole magnesium; Penicillins; Pindolol; Sulfonamide derivatives; Tetracycline; Glycol stearate; and Valsartan  Social History:  The patient  reports that she has never smoked. She has never used smokeless tobacco.   Family History:  The patient's family history includes Heart failure in her mother; Multiple myeloma in her father.    ROS:  Please see the history of present illness. All other systems are reviewed and negative.    PHYSICAL EXAM: VS:  BP (!) 160/100   Pulse 81   Ht '5\' 5"'  (1.651 m)   Wt 127 lb 4.8 oz (57.7 kg)   BMI 21.18 kg/m  , BMI Body mass index is 21.18 kg/m. GEN: Well nourished, well developed, female in no acute distress  HEENT: normal for age  Neck: no JVD, no carotid bruit, no masses Cardiac: RRR; soft murmur, no rubs, or gallops Respiratory:   clear to auscultation bilaterally, normal work of breathing GI: soft, nontender, nondistended, + BS MS: no deformity or atrophy; no edema; distal pulses are 2+ in all 4 extremities   Skin: warm and dry, no rash Neuro:  Strength and sensation are intact Psych: euthymic mood, full affect   EKG:  EKG is ordered today. The ekg ordered today demonstrates SR, no acute changes, normal intervals   Recent Labs: 01/13/2016: BUN 16; Creat 0.74; Hemoglobin 13.0; Magnesium 2.4; Platelets 319; Potassium 4.8; Sodium 139; TSH 3.05    Lipid Panel No results found for: CHOL, TRIG, HDL, CHOLHDL, VLDL, LDLCALC, LDLDIRECT   Wt Readings from Last 3 Encounters:  06/23/16 127 lb 4.8 oz (57.7 kg)  04/22/16 122 lb (55.3 kg)  04/07/16 121 lb 9.6 oz (55.2 kg)     Other studies Reviewed: Additional studies/ records that were reviewed today include: PCP faxed records, office notes and testing  ASSESSMENT AND PLAN:  1.  Palpitations: She has not tolerated any significant dose of metoprolol. She did not tolerate atenolol or pindolol. We will not try any other BBs. She can continue to take 1/4 metoprolol as needed, it does help her palpitations. However, I will change to CCB.  2. HTN: Her BP is very high every am. Will add Cardizem at 30 mg, pt can start with 1/2 tab (she will probably start with 1/4) and increase as tolerated to get her am BP a little lower and help her palpitations.   3. Stress: There may be a component of stress/grief over her husband's condition that influences her VS. Suggested she work with her PCP to speak with a Social worker.   Current medicines are reviewed at length with the patient today.  The patient has concerns regarding medicines. Concerns were addressed.  The following changes have been made:  Change metoprolol to prn, add Cardizem  Labs/ tests ordered today include:  No orders of the defined types were placed in this encounter.   Disposition:   FU with Dr Percival Spanish or myself  in 6 months.  Augusto Garbe  06/23/2016 10:42 AM    [place] Phone: [phone]; Fax: [phone]  This note was written with the assistance of speech recognition software. Please excuse any transcriptional errors.

## 2016-06-23 NOTE — Patient Instructions (Signed)
Medication Instructions:  START CARDIZEM 30MG  1/2 TABLET IN THE Pm, THEN INCREASE TO THREE TIMES DAILY AS TOLERATED   TAKE METOPROLOL AS NEEDED  If you need a refill on your cardiac medications before your next appointment, please call your pharmacy.  Labwork: NONE  Follow-Up: Your physician recommends that you schedule a follow-up appointment in: [place]   Special Instructions: CONTINUE TO TRACK BLOOD PRESSURE AND GIVE [place] A CALL IF NEEDED   Thank you for choosing [facility] at [employer], LPN RHONDA BARRETT PA-C

## 2016-06-24 ENCOUNTER — Telehealth: Payer: Self-pay | Admitting: *Deleted

## 2016-06-24 ENCOUNTER — Telehealth: Payer: Self-pay | Admitting: Cardiology

## 2016-06-24 NOTE — Telephone Encounter (Signed)
Returned call to patient-patient states this morning around 6AM she was awakened by her heart racing/palpitatitions.  States "it felt like my heart was beating out of my chest".  Reports when this woke her up she went downstairs to take her BP and HR and it was 161/93 and HR 115, states she was very winded when she returned upstairs.  Reports taking 1/4 of Cardizem last PM (instead of 1/2 tablet) because she was worried about taking it (new medication, added at OV on 1/4).  Reports she did NOT take her metoprolol last night or this AM.  Pt reports at 835AM her BP was 135/89 HR 135, reports she is feeling better now, she feels as if her heart is still beating fast but is slowing down.    Pt requesting to speak with Rosaria Ferries PA if possible (patient had OV with Rosaria Ferries PA yesterday).  Patient states she "doesnt think this medication is for her".    Advised I would speak with Suanne Marker PA to see if Suanne Marker is available to speak with her.    Advised we will return call with advice/recommedations.  Advised if this symptoms persist or get worse call 911 or proceed to ER for evaluation.  Pt verbalized understanding.

## 2016-06-24 NOTE — Telephone Encounter (Signed)
Spoke to Stanley PA regarding patient (see telephone note) -advised to d/c cardizem and continue taking metoprolol 12.5mg  at bedtime.  Patient insist on speaking to [facility] if possible.  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    Pt agreed and verbalized understanding.

## 2016-06-24 NOTE — Telephone Encounter (Signed)
Michelle Keith is calling because she started taking a medication prescribed by Rosaria Ferries and was advised to call back if she noticed any changes. Michelle Keith states that she has a rapid heartbeat. She is requesting that someone please give her a call.

## 2016-06-24 NOTE — Telephone Encounter (Signed)
Medication list updated.

## 2016-07-28 ENCOUNTER — Ambulatory Visit: Payer: Medicare HMO | Admitting: Cardiology

## 2016-08-07 NOTE — Progress Notes (Signed)
Cardiology Office Note   Date:  08/08/2016   ID:  Michelle Keith, DOB July 18, 1931, MRN [id number]  PCP:  Michelle Ly, MD palp Cardiologist:   Minus Breeding, MD   Chief Complaint  Patient presents with  . Palpitations     History of Present Illness: Michelle Keith is a 83 y.o. female who presents for evaluation of chest pain. She did have a history of syncope. She's had a workup that has included a negative stress perfusion study in 2011. Of note there is mention of a possible VSD in her primary care notes. However, I reviewed the echo results in 2007 and don't see mention of this. In 2016 she had a work up for chest pain with a negative Lexiscan Myoview. (Of note, she could not do a POET (Plain Old Exercise Treadmill).  She wore a Holter in July of last year.  There were no significant arrhythmias.  She was having palpitations.  She has not tolerated beta blockers and has been on calcium channel blockers.  However, she did not tolerate the calcium channel blocker.  She thought that her "heart would explode."     She continues to have labile BPs with rare readings at times as high as 180s/110s.  However there are only a couple of these readings on her BP cuff and typically it is well controlled.  As documented she is extremely sensitive to meds. She actually now is tolerating only a quarter of a metoprolol in the evenings.     Past Medical History:  Diagnosis Date  . Esophageal stricture   . GERD (gastroesophageal reflux disease)   . Osteopenia   . Right thyroid nodule   . Squamous cell skin cancer   . Syncope     Past Surgical History:  Procedure Laterality Date  . Cataract extraction    . ESOPHAGEAL DILATION     x 4  . Exploratory laparoscopy    . NISSEN FUNDOPLICATION    . TOTAL ABDOMINAL HYSTERECTOMY       Current Outpatient Prescriptions  Medication Sig Dispense Refill  . cholecalciferol (VITAMIN D) 1000 UNITS tablet Take 1,000 Units by mouth 4 (four) times a week.      . diazepam (VALIUM) 10 MG tablet Take 10 mg by mouth 2 (two) times daily as needed.    . MECLIZINE HCL PO Take 1 tablet by mouth daily as needed.    . metoprolol tartrate (LOPRESSOR) 25 MG tablet Take 12.5 mg by mouth daily.    . mirtazapine (REMERON) 15 MG tablet Pt take 1/4 tablet daily    . Multiple Vitamins-Minerals (ICAPS AREDS 2 PO) Take 1 capsule by mouth 2 (two) times daily.    . pantoprazole (PROTONIX) 40 MG tablet Take 40 mg by mouth daily.    . Probiotic Product (PROBIOTIC DAILY PO) Take by mouth.    . raloxifene (EVISTA) 60 MG tablet Take 60 mg by mouth daily.    Marland Kitchen zolpidem (AMBIEN) 10 MG tablet Take 10 mg by mouth daily as needed.     No current facility-administered medications for this visit.     Allergies:   Aspirin; Atenolol; Clarithromycin; Epinephrine; Lidocaine; Omeprazole magnesium; Penicillins; Pindolol; Sulfonamide derivatives; Tetracycline; Glycol stearate; and Valsartan    ROS:  Please see the history of present illness.   Otherwise, review of systems are positive for none.   All other systems are reviewed and negative.    PHYSICAL EXAM: VS:  BP (!) 157/85   Pulse  83   Ht 5\' 5"  (1.651 m)   Wt 128 lb 9.6 oz (58.3 kg)   BMI 21.40 kg/m  , BMI Body mass index is 21.4 kg/m. GENERAL:  Well appearing NECK:  No jugular venous distention, waveform within normal limits, carotid upstroke brisk and symmetric, no bruits, no thyromegaly LUNGS:  Clear to auscultation bilaterally BACK:  No CVA tenderness CHEST:  Unremarkable HEART:  PMI not displaced or sustained,S1 and S2 within normal limits, no S3, no S4, no clicks, no rubs, no murmurs ABD:  Flat, positive bowel sounds normal in frequency in pitch, no bruits, no rebound, no guarding, no midline pulsatile mass, no hepatomegaly, no splenomegaly EXT:  2 plus pulses throughout, no edema, no cyanosis no clubbing   EKG:  EKG is not ordered today.  Recent Labs: 01/13/2016: BUN 16; Creat 0.74; Hemoglobin 13.0;  Magnesium 2.4; Platelets 319; Potassium 4.8; Sodium 139; TSH 3.05    Lipid Panel No results found for: CHOL, TRIG, HDL, CHOLHDL, VLDL, LDLCALC, LDLDIRECT    Wt Readings from Last 3 Encounters:  08/08/16 128 lb 9.6 oz (58.3 kg)  06/23/16 127 lb 4.8 oz (57.7 kg)  04/22/16 122 lb (55.3 kg)    Lab Results  Component Value Date   TSH 3.05 01/13/2016     Other studies Reviewed: Additional studies/ records that were reviewed today include: None Review of the above records demonstrates:     ASSESSMENT AND PLAN:   PALPITATIONS:    She is doing much better.  She will continue current therapy.    HTN:  Her BP is very labile.  No change in therapy is indicated.  She should take a valium and rest if her BP is elevated.  We talked about the fact that it is likely to come down if it is elevated.  She could take a half or quarter of her beta blocker as well.   She is very sensitive to medications and I would not try to titrate this further.    Current medicines are reviewed at length with the patient today.  The patient does not have concerns regarding medicines.  The following changes have been made:  None  Labs/ tests ordered today include:     Orders Placed This Encounter  Procedures  . EKG 12-Lead     Disposition:   FU with me in six months.     Signed, Minus Breeding, MD  08/08/2016 1:30 PM    [name]

## 2016-08-08 ENCOUNTER — Encounter: Payer: Self-pay | Admitting: Cardiology

## 2016-08-08 ENCOUNTER — Ambulatory Visit (INDEPENDENT_AMBULATORY_CARE_PROVIDER_SITE_OTHER): Payer: Medicare HMO | Admitting: Cardiology

## 2016-08-08 VITALS — BP 157/85 | HR 83 | Ht 65.0 in | Wt 128.6 lb

## 2016-08-08 DIAGNOSIS — I10 Essential (primary) hypertension: Secondary | ICD-10-CM

## 2016-08-08 DIAGNOSIS — R002 Palpitations: Secondary | ICD-10-CM

## 2016-08-08 NOTE — Patient Instructions (Signed)

## 2016-09-19 ENCOUNTER — Other Ambulatory Visit: Payer: Self-pay

## 2016-09-19 MED ORDER — METOPROLOL TARTRATE 25 MG PO TABS: 12.5000 mg | ORAL_TABLET | Freq: Every day | ORAL | 3 refills | Status: DC

## 2016-09-27 ENCOUNTER — Other Ambulatory Visit: Payer: Self-pay

## 2016-09-27 MED ORDER — METOPROLOL TARTRATE 25 MG PO TABS: 12.5000 mg | ORAL_TABLET | Freq: Every day | ORAL | 3 refills | Status: DC

## 2016-09-27 NOTE — Telephone Encounter (Signed)
Rx(s) sent to pharmacy electronically.  

## 2016-09-28 DIAGNOSIS — I10 Essential (primary) hypertension: Secondary | ICD-10-CM | POA: Diagnosis not present

## 2016-09-28 DIAGNOSIS — L570 Actinic keratosis: Secondary | ICD-10-CM | POA: Diagnosis not present

## 2016-09-28 DIAGNOSIS — R55 Syncope and collapse: Secondary | ICD-10-CM | POA: Diagnosis not present

## 2016-09-28 DIAGNOSIS — H353190 Nonexudative age-related macular degeneration, unspecified eye, stage unspecified: Secondary | ICD-10-CM | POA: Diagnosis not present

## 2016-09-28 DIAGNOSIS — Z0000 Encounter for general adult medical examination without abnormal findings: Secondary | ICD-10-CM | POA: Diagnosis not present

## 2016-09-28 DIAGNOSIS — F418 Other specified anxiety disorders: Secondary | ICD-10-CM | POA: Diagnosis not present

## 2016-09-28 DIAGNOSIS — M25559 Pain in unspecified hip: Secondary | ICD-10-CM | POA: Diagnosis not present

## 2016-09-28 DIAGNOSIS — H8113 Benign paroxysmal vertigo, bilateral: Secondary | ICD-10-CM | POA: Diagnosis not present

## 2016-09-28 DIAGNOSIS — R808 Other proteinuria: Secondary | ICD-10-CM | POA: Diagnosis not present

## 2016-09-28 DIAGNOSIS — M810 Age-related osteoporosis without current pathological fracture: Secondary | ICD-10-CM | POA: Diagnosis not present

## 2016-09-28 DIAGNOSIS — E559 Vitamin D deficiency, unspecified: Secondary | ICD-10-CM | POA: Diagnosis not present

## 2016-11-21 DIAGNOSIS — L570 Actinic keratosis: Secondary | ICD-10-CM | POA: Diagnosis not present

## 2016-11-21 DIAGNOSIS — L814 Other melanin hyperpigmentation: Secondary | ICD-10-CM | POA: Diagnosis not present

## 2016-11-21 DIAGNOSIS — L820 Inflamed seborrheic keratosis: Secondary | ICD-10-CM | POA: Diagnosis not present

## 2016-11-21 DIAGNOSIS — L821 Other seborrheic keratosis: Secondary | ICD-10-CM | POA: Diagnosis not present

## 2016-11-23 ENCOUNTER — Emergency Department (HOSPITAL_COMMUNITY)
Admission: EM | Admit: 2016-11-23 | Discharge: 2016-11-23 | Disposition: A | Discharge status: Home / Self Care | Payer: Medicare HMO | Source: Home / Self Care | Attending: Emergency Medicine | Admitting: Emergency Medicine

## 2016-11-23 ENCOUNTER — Emergency Department (HOSPITAL_COMMUNITY): Payer: Medicare HMO

## 2016-11-23 ENCOUNTER — Other Ambulatory Visit: Payer: Self-pay

## 2016-11-23 ENCOUNTER — Encounter (HOSPITAL_COMMUNITY): Payer: Self-pay | Admitting: Family Medicine

## 2016-11-23 DIAGNOSIS — R197 Diarrhea, unspecified: Secondary | ICD-10-CM | POA: Insufficient documentation

## 2016-11-23 DIAGNOSIS — Z79899 Other long term (current) drug therapy: Secondary | ICD-10-CM

## 2016-11-23 DIAGNOSIS — M8580 Other specified disorders of bone density and structure, unspecified site: Secondary | ICD-10-CM | POA: Diagnosis present

## 2016-11-23 DIAGNOSIS — K219 Gastro-esophageal reflux disease without esophagitis: Secondary | ICD-10-CM | POA: Diagnosis present

## 2016-11-23 DIAGNOSIS — R6510 Systemic inflammatory response syndrome (SIRS) of non-infectious origin without acute organ dysfunction: Secondary | ICD-10-CM | POA: Diagnosis not present

## 2016-11-23 DIAGNOSIS — K5651 Intestinal adhesions [bands], with partial obstruction: Secondary | ICD-10-CM | POA: Diagnosis not present

## 2016-11-23 DIAGNOSIS — Z881 Allergy status to other antibiotic agents status: Secondary | ICD-10-CM | POA: Diagnosis not present

## 2016-11-23 DIAGNOSIS — I10 Essential (primary) hypertension: Secondary | ICD-10-CM | POA: Insufficient documentation

## 2016-11-23 DIAGNOSIS — K529 Noninfective gastroenteritis and colitis, unspecified: Secondary | ICD-10-CM | POA: Diagnosis not present

## 2016-11-23 DIAGNOSIS — Z882 Allergy status to sulfonamides status: Secondary | ICD-10-CM | POA: Diagnosis not present

## 2016-11-23 DIAGNOSIS — Z880 Allergy status to penicillin: Secondary | ICD-10-CM | POA: Diagnosis not present

## 2016-11-23 DIAGNOSIS — E871 Hypo-osmolality and hyponatremia: Secondary | ICD-10-CM | POA: Diagnosis not present

## 2016-11-23 DIAGNOSIS — R55 Syncope and collapse: Secondary | ICD-10-CM | POA: Insufficient documentation

## 2016-11-23 DIAGNOSIS — R1013 Epigastric pain: Secondary | ICD-10-CM | POA: Diagnosis present

## 2016-11-23 DIAGNOSIS — Z90710 Acquired absence of both cervix and uterus: Secondary | ICD-10-CM | POA: Diagnosis not present

## 2016-11-23 DIAGNOSIS — K6389 Other specified diseases of intestine: Secondary | ICD-10-CM | POA: Diagnosis not present

## 2016-11-23 DIAGNOSIS — K5669 Other partial intestinal obstruction: Secondary | ICD-10-CM | POA: Diagnosis not present

## 2016-11-23 DIAGNOSIS — Z85828 Personal history of other malignant neoplasm of skin: Secondary | ICD-10-CM | POA: Diagnosis not present

## 2016-11-23 DIAGNOSIS — K222 Esophageal obstruction: Secondary | ICD-10-CM | POA: Diagnosis not present

## 2016-11-23 DIAGNOSIS — R404 Transient alteration of awareness: Secondary | ICD-10-CM | POA: Diagnosis not present

## 2016-11-23 DIAGNOSIS — D72819 Decreased white blood cell count, unspecified: Secondary | ICD-10-CM | POA: Diagnosis present

## 2016-11-23 DIAGNOSIS — A09 Infectious gastroenteritis and colitis, unspecified: Secondary | ICD-10-CM | POA: Diagnosis not present

## 2016-11-23 DIAGNOSIS — Z886 Allergy status to analgesic agent status: Secondary | ICD-10-CM | POA: Diagnosis not present

## 2016-11-23 DIAGNOSIS — Z807 Family history of other malignant neoplasms of lymphoid, hematopoietic and related tissues: Secondary | ICD-10-CM | POA: Diagnosis not present

## 2016-11-23 DIAGNOSIS — S0990XA Unspecified injury of head, initial encounter: Secondary | ICD-10-CM | POA: Diagnosis not present

## 2016-11-23 DIAGNOSIS — R140 Abdominal distension (gaseous): Secondary | ICD-10-CM | POA: Diagnosis not present

## 2016-11-23 DIAGNOSIS — K567 Ileus, unspecified: Secondary | ICD-10-CM | POA: Diagnosis not present

## 2016-11-23 DIAGNOSIS — R1010 Upper abdominal pain, unspecified: Secondary | ICD-10-CM | POA: Diagnosis not present

## 2016-11-23 DIAGNOSIS — Z9049 Acquired absence of other specified parts of digestive tract: Secondary | ICD-10-CM | POA: Diagnosis not present

## 2016-11-23 DIAGNOSIS — Z8249 Family history of ischemic heart disease and other diseases of the circulatory system: Secondary | ICD-10-CM | POA: Diagnosis not present

## 2016-11-23 DIAGNOSIS — Z888 Allergy status to other drugs, medicaments and biological substances status: Secondary | ICD-10-CM | POA: Diagnosis not present

## 2016-11-23 DIAGNOSIS — R000 Tachycardia, unspecified: Secondary | ICD-10-CM | POA: Diagnosis not present

## 2016-11-23 LAB — COMPREHENSIVE METABOLIC PANEL
ALK PHOS: 54 U/L (ref 38–126)
ALT: 10 U/L — AB (ref 14–54)
AST: 35 U/L (ref 15–41)
Albumin: 3.7 g/dL (ref 3.5–5.0)
Anion gap: 9 (ref 5–15)
BUN: 16 mg/dL (ref 6–20)
CALCIUM: 8 mg/dL — AB (ref 8.9–10.3)
CO2: 23 mmol/L (ref 22–32)
CREATININE: 0.87 mg/dL (ref 0.44–1.00)
Chloride: 103 mmol/L (ref 101–111)
GFR calc non Af Amer: >60 mL/min (ref >60)
GLUCOSE: 144 mg/dL — AB (ref 65–99)
Potassium: 5 mmol/L (ref 3.5–5.1)
SODIUM: 135 mmol/L (ref 135–145)
Total Bilirubin: 1.2 mg/dL (ref 0.3–1.2)
Total Protein: 6 g/dL — ABNORMAL LOW (ref 6.5–8.1)

## 2016-11-23 LAB — URINALYSIS, ROUTINE W REFLEX MICROSCOPIC
BILIRUBIN URINE: NEGATIVE
Glucose, UA: NEGATIVE mg/dL
Hgb urine dipstick: NEGATIVE
Ketones, ur: 5 mg/dL — AB
NITRITE: NEGATIVE
PH: 5 (ref 5.0–8.0)
Protein, ur: NEGATIVE mg/dL
SPECIFIC GRAVITY, URINE: 1.016 (ref 1.005–1.030)

## 2016-11-23 LAB — CBC WITH DIFFERENTIAL/PLATELET
BASOS PCT: 0 %
Basophils Absolute: 0 10*3/uL (ref 0.0–0.1)
EOS ABS: 0 10*3/uL (ref 0.0–0.7)
Eosinophils Relative: 0 %
HCT: 38.1 % (ref 36.0–46.0)
Hemoglobin: 12.7 g/dL (ref 12.0–15.0)
LYMPHS ABS: 1.5 10*3/uL (ref 0.7–4.0)
Lymphocytes Relative: 9 %
MCH: 30.2 pg (ref 26.0–34.0)
MCHC: 33.3 g/dL (ref 30.0–36.0)
MCV: 90.5 fL (ref 78.0–100.0)
MONO ABS: 0.7 10*3/uL (ref 0.1–1.0)
Monocytes Relative: 4 %
NEUTROS ABS: 14.2 10*3/uL — AB (ref 1.7–7.7)
NEUTROS PCT: 87 %
PLATELETS: 287 10*3/uL (ref 150–400)
RBC: 4.21 MIL/uL (ref 3.87–5.11)
RDW: 14.1 % (ref 11.5–15.5)
WBC: 16.4 10*3/uL — ABNORMAL HIGH (ref 4.0–10.5)

## 2016-11-23 LAB — I-STAT CG4 LACTIC ACID, ED: Lactic Acid, Venous: 2.18 mmol/L (ref 0.5–1.9)

## 2016-11-23 LAB — I-STAT TROPONIN, ED: TROPONIN I, POC: 0 ng/mL (ref 0.00–0.08)

## 2016-11-23 LAB — CBG MONITORING, ED: Glucose-Capillary: 128 mg/dL — ABNORMAL HIGH (ref 65–99)

## 2016-11-23 NOTE — Discharge Instructions (Signed)
You were seen today for your loss of consciousness. A CT was offered and risks were discussed. If you develop concerning symptoms including facial droop, weakness in your extremities, inability to walk, changes in your vision please return for re-evaluation. You can return to the emergency department for worsening or concerning symptoms. Please schedule an appointment and follow up with your PCP in the next days. Please follow a BRAT (banana, rice, applesauce, toast) until diarrhea resolves. I have attached a handout on diarrhea.

## 2016-11-23 NOTE — ED Triage Notes (Signed)
Patient is from home and transported via [facility]. Patient was at the nursing home visiting her spouse, when she experienced two syncopal episodes. Pt did hit her right side of her  head on a wall. Also, complains of diarrhea. IV was established by EMS and given NS fluids.

## 2016-11-23 NOTE — ED Provider Notes (Signed)
[place] DEPT Provider Note   CSN: [id number] Arrival date & time: 11/23/16  1439     History   Chief Complaint Chief Complaint  Patient presents with  . Diarrhea  . Loss of Consciousness    HPI Michelle Keith is a 83 y.o. female who presents via [facility] after 2 syncopal episodes and diarrhea. The patient was visiting her husband at a nursing home today. While at at lunch around 12:00, she began feeling "sick" describing a feeling of weakness, breaking into a sweat, nausea and the sensation  she was going to have diarrhea. She promptly went to her husband's room where she had an accident on herself. Shortly after, she was attempting to have another bowel movement on the commode when she experienced a feeling of "being very sick, like I was going to pass out" and lost consciousness. The patient slumped against the wall and hit her head on the right temporal side. This was witnessed by her son who reported the patient was out for 2-3 minutes. When the patient awoke she was slightly confused but returned to normal state of mind in <2 minutes. A similar event occurred one hour later when she was trying to have another bowel movement. She has had 3 episodes of nonbloody diarrhea since 12:00 this evening. She states that after receiving IV fluid on EMS she feels much better. The patient is not on any blood thinners. She denies fever, chills, emesis, seizure, headache, alcohol use, weakness on one side, facial droop, slurring of words, numbness, tingling, urinary frequency, urinary hesitancy, hematuria. The patient is prescribed to benzodiazepines which she denies using today. Patient notes that the only medication changes which she was put on metoprolol 4-5 months ago. She stopped taking her antidepressant 2 days ago.  To note the patient notes that this is been going on for some time. She notes she has been having syncopal episodes frequently over the past few years, but they have been  getting worse. 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 She believes this is the cause for her symptoms.    HPI  Past Medical History:  Diagnosis Date  . Esophageal stricture   . GERD (gastroesophageal reflux disease)   . Osteopenia   . Right thyroid nodule   . Squamous cell skin cancer   . Syncope     Patient Active Problem List   Diagnosis Date Noted  . Essential hypertension 06/23/2016  . DYSAUTONOMIA 07/10/2009  . SYNCOPE 07/10/2009  . Heart palpitations 07/10/2009  . Chest pain 07/10/2009    Past Surgical History:  Procedure Laterality Date  . Cataract extraction    . ESOPHAGEAL DILATION     x 4  . Exploratory laparoscopy    . NISSEN FUNDOPLICATION    . TOTAL ABDOMINAL HYSTERECTOMY      OB History    No data available       Home Medications    Prior to Admission medications   Medication Sig Start Date End Date Taking? Authorizing Provider  cholecalciferol (VITAMIN D) 1000 UNITS tablet Take 1,000 Units by mouth daily.    Yes [provider]  meclizine (ANTIVERT) 25 MG tablet Take 25 mg by mouth 2 (two) times daily as needed for dizziness or nausea.   Yes [provider]  metoprolol tartrate (LOPRESSOR) 25 MG tablet Take 0.5 tablets (12.5 mg total) by mouth daily. 09/27/16  Yes Minus Breeding, MD  mirtazapine (REMERON) 15 MG tablet Take 3.75 mg by mouth at bedtime.    Yes [provider]  Multiple Vitamins-Minerals (ICAPS AREDS 2 PO) Take 1 capsule by mouth 2 (two) times daily.   Yes [provider]  Probiotic Product (PROBIOTIC DAILY PO) Take 1 capsule by mouth daily.    Yes [provider]  raloxifene (EVISTA) 60 MG tablet Take 60 mg by mouth daily.   Yes [provider]  ranitidine (ZANTAC) 150 MG tablet Take 150 mg by mouth 2 (two) times daily as needed for heartburn.   Yes [provider]  pantoprazole (PROTONIX) 40 MG tablet Take 40 mg by  mouth daily as needed (heartburn).     [provider]  zolpidem (AMBIEN) 10 MG tablet Take 2.5 mg by mouth daily as needed for sleep.     [provider]    Family History Family History  Problem Relation Age of Onset  . Multiple myeloma Father        died age 62  . Heart failure Mother        died age 42    Social History Social History  Substance Use Topics  . Smoking status: Never Smoker  . Smokeless tobacco: Never Used  . Alcohol use No     Allergies   Aspirin; Atenolol; Clarithromycin; Epinephrine; Lidocaine; Omeprazole magnesium; Penicillins; Pindolol; Sulfonamide derivatives; Tetracycline; Glycol stearate; and Valsartan   Review of Systems Review of Systems  All other systems reviewed and are negative.    Physical Exam Updated Vital Signs BP (!) 124/54   Pulse 94   Temp 97.6 F (36.4 C) (Oral)   Resp 12   SpO2 97%   Physical Exam  Constitutional: She appears well-developed and well-nourished.  HENT:  Head: Normocephalic and atraumatic. Head is without raccoon's eyes, without Battle's sign and without contusion.  Right Ear: Hearing, tympanic membrane, external ear and ear canal normal. No hemotympanum.  Left Ear: Hearing, external ear and ear canal normal.  Nose: Nose normal.  Mouth/Throat: Uvula is midline, oropharynx is clear and moist and mucous membranes are normal.  Cerumen impaction on the left side. No CSF leak  Eyes: Conjunctivae and lids are normal. Pupils are equal, round, and reactive to light. Right eye exhibits no nystagmus. Left eye exhibits no nystagmus. Pupils are equal.  Neck: Full passive range of motion without pain. Neck supple. No spinous process tenderness present. Carotid bruit is not present.  Cardiovascular: Normal rate, regular rhythm, normal heart sounds and intact distal pulses.   No murmur heard. Pulses:      Radial pulses are 2+ on the right side, and 2+ on the left side.       Dorsalis pedis pulses are 2+  on the right side, and 2+ on the left side.       Posterior tibial pulses are 2+ on the right side, and 2+ on the left side.  Pulmonary/Chest: Effort normal and breath sounds normal. She exhibits no tenderness.  Abdominal: Soft. Bowel sounds are normal. There is no tenderness. There is no rebound and no guarding.  Musculoskeletal: She exhibits no edema.  Lymphadenopathy:    She has no cervical adenopathy.  Neurological: She is alert. She has normal strength and normal reflexes. No cranial nerve deficit (3-12 intact) or sensory deficit. She displays a negative Romberg sign. Coordination and gait normal.  No evidence of pronator drift. No dysmetria upper or lower.   Skin: No rash noted. She is not diaphoretic.  Psychiatric: She has a normal mood and affect.  Nursing note and vitals reviewed.  ED Treatments / Results  Labs (all labs ordered are listed, but only abnormal results are displayed) Labs Reviewed  URINALYSIS, ROUTINE W REFLEX MICROSCOPIC - Abnormal; Notable for the following:       Result Value   APPearance HAZY (*)    Ketones, ur 5 (*)    Leukocytes, UA LARGE (*)    Bacteria, UA FEW (*)    Squamous Epithelial / LPF 0-5 (*)    All other components within normal limits  COMPREHENSIVE METABOLIC PANEL - Abnormal; Notable for the following:    Glucose, Bld 144 (*)    Calcium 8.0 (*)    Total Protein 6.0 (*)    ALT 10 (*)    All other components within normal limits  CBC WITH DIFFERENTIAL/PLATELET - Abnormal; Notable for the following:    WBC 16.4 (*)    Neutro Abs 14.2 (*)    All other components within normal limits  CBG MONITORING, ED - Abnormal; Notable for the following:    Glucose-Capillary 128 (*)    All other components within normal limits  I-STAT CG4 LACTIC ACID, ED - Abnormal; Notable for the following:    Lactic Acid, Venous 2.18 (*)    All other components within normal limits  I-STAT TROPOININ, ED    EKG  EKG Interpretation None       Radiology Dg  Chest 2 View  Result Date: 11/23/2016 CLINICAL DATA:  Two syncopal episodes during which the patient hit the head on wall. History of previous episodes of syncope. Nonsmoker. EXAM: CHEST  2 VIEW COMPARISON:  Chest x-ray of August 22, 2007 FINDINGS: The lungs are well-expanded and clear. The heart and pulmonary vascularity are normal. The mediastinum is normal in width. There is no pleural effusion. The bony thorax exhibits no acute abnormality. There are degenerative changes of both shoulders. There are surgical clips to the level of the GE junction. IMPRESSION: There is no pneumonia, CHF, nor other acute cardiopulmonary abnormality. Electronically Signed   By: David  Martinique M.D.   On: 11/23/2016 16:51    Procedures Procedures (including critical care time)  Medications Ordered in ED Medications - No data to display   Initial Impression / Assessment and Plan / ED Course  I have reviewed the triage vital signs and the nursing notes.  Pertinent labs & imaging results that were available during my care of the patient were reviewed by me and considered in my medical decision making (see chart for details).     This  is a 83 y.o. female who presents after 2 syncopal episodes and diarrhea. The patient experienced the 2 syncopal episodes while trying to have a bowel movement while on the commode. During one occasion the patient hit her right temporal side of her head and lost consciousness for 2-3 minutes. 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   The patient vitals were stable on presentation. She was afebrile with normal HR and RR, sating 100% on RA with BP on 145/72. The patient cardiopulmonary and neuro exam was unremarkable for concerning pathology. Patient gate was steady and able.   CT head and neck ordered but patient refused. Discussed risks of not receiving head CT including the possibility of  missing possible intracranial abnormality. Patient understands risks and states she does not want imaging at  this time.   Tn, CXR, EKG, UA, CBC, CMP, CBG ordered to investigate possible eitiology.   CBG bedside 128.  Lactic acid elevated at 2.18, likely due to dehydration from diarrhea. Patient given fluids. Vitals not concerning for sepsis.   Tn 0.00. EKG without changes from past EKGs. CXR without pneumonia, CHF or other cardiopulmonary abnormality. Do not believe etiology is cardiac in origin.   CBC with elevated WBC correlating with leukocytosis but otherwise unremarkable. This could be explained as the patient has had several episodes of diarrhea and is dehydrated. CMP with low protein correlating with past bloodwork.  UA with leukocytes. Negative nitrates. However the patient is asymptomatic so do not suspect this is a UTI but rather asymptomatic bacteruria.   Suspect this may be a vasovagal syncope due to her history. Cannot r/o intracranial process as the patient refused Head and neck CT. Return precautions given. Patient told they can return at anytime for worsening or new concerning symptoms.   Patient seen and discussed in collaboration with Will Dansie, PA-C.  Final Clinical Impressions(s) / ED Diagnoses   Final diagnoses:  Vasovagal syncope  Diarrhea, unspecified type    New Prescriptions Discharge Medication List as of 11/23/2016  5:33 PM       Jillyn Ledger, PA-C 11/24/16 1037    Isla Pence, MD 11/28/16 1057

## 2016-11-23 NOTE — ED Provider Notes (Signed)
Patient seen in conjunction with orienting PA. Please see his note for further. Patient presented after syncopal episode after diarrhea. Patient has chronic problems of diarrhea and syncope after she had a vagal nerve injury during the surgery. On my exam patient is afebrile and nontoxic-appearing. She is awake and alert. She has no focal neurological deficits. She reports feeling back to baseline of my evaluation. Lactic acid is mildly elevated at 2.18. Suspect this is related to dehydration. No concern for sepsis. Urinalysis is without sign of infection. Troponin is not elevated. CMP and CBC are unremarkable. Chest x-ray is unremarkable. Plan is for discharge with follow-up by her primary care provider. At reevaluation patient is tolerating by mouth and feels back to baseline.  Results for orders placed or performed during the hospital encounter of 11/23/16  Urinalysis, Routine w reflex microscopic  Result Value Ref Range   Color, Urine YELLOW YELLOW   APPearance HAZY (A) CLEAR   Specific Gravity, Urine 1.016 1.005 - 1.030   pH 5.0 5.0 - 8.0   Glucose, UA NEGATIVE NEGATIVE mg/dL   Hgb urine dipstick NEGATIVE NEGATIVE   Bilirubin Urine NEGATIVE NEGATIVE   Ketones, ur 5 (A) NEGATIVE mg/dL   Protein, ur NEGATIVE NEGATIVE mg/dL   Nitrite NEGATIVE NEGATIVE   Leukocytes, UA LARGE (A) NEGATIVE   RBC / HPF 0-5 0 - 5 RBC/hpf   WBC, UA 6-30 0 - 5 WBC/hpf   Bacteria, UA FEW (A) NONE SEEN   Squamous Epithelial / LPF 0-5 (A) NONE SEEN   Mucous PRESENT    Hyaline Casts, UA PRESENT   Comprehensive metabolic panel  Result Value Ref Range   Sodium 135 135 - 145 mmol/L   Potassium 5.0 3.5 - 5.1 mmol/L   Chloride 103 101 - 111 mmol/L   CO2 23 22 - 32 mmol/L   Glucose, Bld 144 (H) 65 - 99 mg/dL   BUN 16 6 - 20 mg/dL   Creatinine, Ser 0.87 0.44 - 1.00 mg/dL   Calcium 8.0 (L) 8.9 - 10.3 mg/dL   Total Protein 6.0 (L) 6.5 - 8.1 g/dL   Albumin 3.7 3.5 - 5.0 g/dL   AST 35 15 - 41 U/L   ALT 10 (L) 14 -  54 U/L   Alkaline Phosphatase 54 38 - 126 U/L   Total Bilirubin 1.2 0.3 - 1.2 mg/dL   GFR calc non Af Amer >60 >60 mL/min   GFR calc Af Amer >60 >60 mL/min   Anion gap 9 5 - 15  CBC with Differential  Result Value Ref Range   WBC 16.4 (H) 4.0 - 10.5 K/uL   RBC 4.21 3.87 - 5.11 MIL/uL   Hemoglobin 12.7 12.0 - 15.0 g/dL   HCT 38.1 36.0 - 46.0 %   MCV 90.5 78.0 - 100.0 fL   MCH 30.2 26.0 - 34.0 pg   MCHC 33.3 30.0 - 36.0 g/dL   RDW 14.1 11.5 - 15.5 %   Platelets 287 150 - 400 K/uL   Neutrophils Relative % 87 %   Lymphocytes Relative 9 %   Monocytes Relative 4 %   Eosinophils Relative 0 %   Basophils Relative 0 %   Neutro Abs 14.2 (H) 1.7 - 7.7 K/uL   Lymphs Abs 1.5 0.7 - 4.0 K/uL   Monocytes Absolute 0.7 0.1 - 1.0 K/uL   Eosinophils Absolute 0.0 0.0 - 0.7 K/uL   Basophils Absolute 0.0 0.0 - 0.1 K/uL   Smear Review MORPHOLOGY UNREMARKABLE   CBG monitoring,  ED  Result Value Ref Range   Glucose-Capillary 128 (H) 65 - 99 mg/dL  I-stat troponin, ED  Result Value Ref Range   Troponin i, poc 0.00 0.00 - 0.08 ng/mL   Comment 3          I-Stat CG4 Lactic Acid, ED  Result Value Ref Range   Lactic Acid, Venous 2.18 (HH) 0.5 - 1.9 mmol/L   Comment NOTIFIED PHYSICIAN    Dg Chest 2 View  Result Date: 11/23/2016 CLINICAL DATA:  Two syncopal episodes during which the patient hit the head on wall. History of previous episodes of syncope. Nonsmoker. EXAM: CHEST  2 VIEW COMPARISON:  Chest x-ray of August 22, 2007 FINDINGS: The lungs are well-expanded and clear. The heart and pulmonary vascularity are normal. The mediastinum is normal in width. There is no pleural effusion. The bony thorax exhibits no acute abnormality. There are degenerative changes of both shoulders. There are surgical clips to the level of the GE junction. IMPRESSION: There is no pneumonia, CHF, nor other acute cardiopulmonary abnormality. Electronically Signed   By: David  Martinique M.D.   On: 11/23/2016 16:51     Vasovagal  syncope  Diarrhea, unspecified type      Waynetta Pean, PA-C 11/23/16 1748    Isla Pence, MD 11/23/16 [phone]

## 2016-11-25 ENCOUNTER — Emergency Department (HOSPITAL_COMMUNITY): Payer: Medicare HMO

## 2016-11-25 ENCOUNTER — Inpatient Hospital Stay (HOSPITAL_COMMUNITY)
Admission: EM | Admit: 2016-11-25 | Discharge: 2016-11-30 | DRG: 389 | Disposition: A | Discharge status: Home Health | Payer: Medicare HMO | Attending: Internal Medicine | Admitting: Internal Medicine

## 2016-11-25 ENCOUNTER — Encounter (HOSPITAL_COMMUNITY): Payer: Self-pay

## 2016-11-25 DIAGNOSIS — R140 Abdominal distension (gaseous): Secondary | ICD-10-CM | POA: Diagnosis not present

## 2016-11-25 DIAGNOSIS — Z79899 Other long term (current) drug therapy: Secondary | ICD-10-CM

## 2016-11-25 DIAGNOSIS — D72819 Decreased white blood cell count, unspecified: Secondary | ICD-10-CM | POA: Diagnosis present

## 2016-11-25 DIAGNOSIS — K222 Esophageal obstruction: Secondary | ICD-10-CM | POA: Diagnosis present

## 2016-11-25 DIAGNOSIS — Z881 Allergy status to other antibiotic agents status: Secondary | ICD-10-CM

## 2016-11-25 DIAGNOSIS — Z880 Allergy status to penicillin: Secondary | ICD-10-CM | POA: Diagnosis not present

## 2016-11-25 DIAGNOSIS — K5651 Intestinal adhesions [bands], with partial obstruction: Secondary | ICD-10-CM | POA: Diagnosis present

## 2016-11-25 DIAGNOSIS — Z888 Allergy status to other drugs, medicaments and biological substances status: Secondary | ICD-10-CM

## 2016-11-25 DIAGNOSIS — Z882 Allergy status to sulfonamides status: Secondary | ICD-10-CM

## 2016-11-25 DIAGNOSIS — Z90710 Acquired absence of both cervix and uterus: Secondary | ICD-10-CM

## 2016-11-25 DIAGNOSIS — K219 Gastro-esophageal reflux disease without esophagitis: Secondary | ICD-10-CM | POA: Diagnosis present

## 2016-11-25 DIAGNOSIS — R6510 Systemic inflammatory response syndrome (SIRS) of non-infectious origin without acute organ dysfunction: Secondary | ICD-10-CM | POA: Diagnosis present

## 2016-11-25 DIAGNOSIS — I10 Essential (primary) hypertension: Secondary | ICD-10-CM | POA: Diagnosis present

## 2016-11-25 DIAGNOSIS — Z807 Family history of other malignant neoplasms of lymphoid, hematopoietic and related tissues: Secondary | ICD-10-CM

## 2016-11-25 DIAGNOSIS — Z85828 Personal history of other malignant neoplasm of skin: Secondary | ICD-10-CM

## 2016-11-25 DIAGNOSIS — Z886 Allergy status to analgesic agent status: Secondary | ICD-10-CM

## 2016-11-25 DIAGNOSIS — K529 Noninfective gastroenteritis and colitis, unspecified: Secondary | ICD-10-CM | POA: Diagnosis present

## 2016-11-25 DIAGNOSIS — K567 Ileus, unspecified: Secondary | ICD-10-CM

## 2016-11-25 DIAGNOSIS — Z9049 Acquired absence of other specified parts of digestive tract: Secondary | ICD-10-CM

## 2016-11-25 DIAGNOSIS — R55 Syncope and collapse: Secondary | ICD-10-CM | POA: Diagnosis present

## 2016-11-25 DIAGNOSIS — R000 Tachycardia, unspecified: Secondary | ICD-10-CM | POA: Diagnosis present

## 2016-11-25 DIAGNOSIS — R1013 Epigastric pain: Secondary | ICD-10-CM | POA: Diagnosis present

## 2016-11-25 DIAGNOSIS — M8580 Other specified disorders of bone density and structure, unspecified site: Secondary | ICD-10-CM | POA: Diagnosis present

## 2016-11-25 DIAGNOSIS — R197 Diarrhea, unspecified: Secondary | ICD-10-CM | POA: Diagnosis not present

## 2016-11-25 DIAGNOSIS — E871 Hypo-osmolality and hyponatremia: Secondary | ICD-10-CM | POA: Diagnosis present

## 2016-11-25 DIAGNOSIS — Z8249 Family history of ischemic heart disease and other diseases of the circulatory system: Secondary | ICD-10-CM | POA: Diagnosis not present

## 2016-11-25 HISTORY — DX: Ileus, unspecified: K56.7

## 2016-11-25 LAB — CBC
HCT: 38.3 % (ref 36.0–46.0)
Hemoglobin: 13.2 g/dL (ref 12.0–15.0)
MCH: 31.1 pg (ref 26.0–34.0)
MCHC: 34.5 g/dL (ref 30.0–36.0)
MCV: 90.1 fL (ref 78.0–100.0)
PLATELETS: 298 10*3/uL (ref 150–400)
RBC: 4.25 MIL/uL (ref 3.87–5.11)
RDW: 14 % (ref 11.5–15.5)
WBC: 3.7 10*3/uL — ABNORMAL LOW (ref 4.0–10.5)

## 2016-11-25 LAB — BASIC METABOLIC PANEL
ANION GAP: 10 (ref 5–15)
BUN: 13 mg/dL (ref 6–20)
CALCIUM: 8.2 mg/dL — AB (ref 8.9–10.3)
CO2: 22 mmol/L (ref 22–32)
CREATININE: 0.81 mg/dL (ref 0.44–1.00)
Chloride: 100 mmol/L — ABNORMAL LOW (ref 101–111)
GLUCOSE: 135 mg/dL — AB (ref 65–99)
Potassium: 3.9 mmol/L (ref 3.5–5.1)
Sodium: 132 mmol/L — ABNORMAL LOW (ref 135–145)

## 2016-11-25 LAB — URINALYSIS, ROUTINE W REFLEX MICROSCOPIC
Bilirubin Urine: NEGATIVE
Bilirubin Urine: NEGATIVE
GLUCOSE, UA: NEGATIVE mg/dL
Glucose, UA: NEGATIVE mg/dL
HGB URINE DIPSTICK: NEGATIVE
HGB URINE DIPSTICK: NEGATIVE
Ketones, ur: NEGATIVE mg/dL
Ketones, ur: 5 mg/dL — AB
Leukocytes, UA: NEGATIVE
Leukocytes, UA: NEGATIVE
Nitrite: NEGATIVE
Nitrite: NEGATIVE
PROTEIN: NEGATIVE mg/dL
PROTEIN: NEGATIVE mg/dL
Specific Gravity, Urine: 1.008 (ref 1.005–1.030)
Specific Gravity, Urine: 1.034 — ABNORMAL HIGH (ref 1.005–1.030)
pH: 5 (ref 5.0–8.0)
pH: 6 (ref 5.0–8.0)

## 2016-11-25 LAB — CBG MONITORING, ED: Glucose-Capillary: 133 mg/dL — ABNORMAL HIGH (ref 65–99)

## 2016-11-25 MED ORDER — SODIUM CHLORIDE 0.9 % IV SOLN
INTRAVENOUS | Status: DC
  Administered 2016-11-25: 1000 mL via INTRAVENOUS
  Administered 2016-11-27: 16:00:00 via INTRAVENOUS
  Administered 2016-11-29: 1000 mL via INTRAVENOUS

## 2016-11-25 MED ORDER — FENTANYL CITRATE (PF) 100 MCG/2ML IJ SOLN
50.0000 ug | Freq: Once | INTRAMUSCULAR | Status: AC
  Administered 2016-11-25: 50 ug via INTRAVENOUS
  Filled 2016-11-25: qty 2

## 2016-11-25 MED ORDER — IOPAMIDOL (ISOVUE-300) INJECTION 61%
100.0000 mL | Freq: Once | INTRAVENOUS | Status: AC | PRN
  Administered 2016-11-25: 80 mL via INTRAVENOUS

## 2016-11-25 MED ORDER — ONDANSETRON HCL 4 MG/2ML IJ SOLN: 4.0000 mg | Freq: Four times a day (QID) | INTRAMUSCULAR | Status: DC | PRN

## 2016-11-25 MED ORDER — ONDANSETRON HCL 4 MG PO TABS: 4.0000 mg | ORAL_TABLET | Freq: Four times a day (QID) | ORAL | Status: DC | PRN

## 2016-11-25 MED ORDER — TRAMADOL HCL 50 MG PO TABS: 50.0000 mg | ORAL_TABLET | Freq: Four times a day (QID) | ORAL | Status: DC | PRN

## 2016-11-25 MED ORDER — SODIUM CHLORIDE 0.9 % IV BOLUS (SEPSIS)
1000.0000 mL | Freq: Once | INTRAVENOUS | Status: AC
  Administered 2016-11-25: 1000 mL via INTRAVENOUS

## 2016-11-25 MED ORDER — VITAMIN D 1000 UNITS PO TABS
1000.0000 [IU] | ORAL_TABLET | Freq: Every day | ORAL | Status: DC
  Administered 2016-11-26 – 2016-11-30 (×5): 1000 [IU] via ORAL
  Filled 2016-11-25 (×6): qty 1

## 2016-11-25 MED ORDER — PANTOPRAZOLE SODIUM 40 MG PO TBEC: 40.0000 mg | DELAYED_RELEASE_TABLET | Freq: Every day | ORAL | Status: DC | PRN

## 2016-11-25 MED ORDER — RALOXIFENE HCL 60 MG PO TABS
60.0000 mg | ORAL_TABLET | Freq: Every day | ORAL | Status: DC
  Administered 2016-11-26 – 2016-11-30 (×4): 60 mg via ORAL
  Filled 2016-11-25 (×6): qty 1

## 2016-11-25 MED ORDER — MECLIZINE HCL 25 MG PO TABS: 25.0000 mg | ORAL_TABLET | Freq: Two times a day (BID) | ORAL | Status: DC | PRN

## 2016-11-25 MED ORDER — METOPROLOL TARTRATE 12.5 MG HALF TABLET
12.5000 mg | ORAL_TABLET | Freq: Every day | ORAL | Status: DC
  Administered 2016-11-25: 12.5 mg via ORAL
  Filled 2016-11-25: qty 1

## 2016-11-25 MED ORDER — MIRTAZAPINE 7.5 MG PO TABS
3.7500 mg | ORAL_TABLET | Freq: Every day | ORAL | Status: DC
  Administered 2016-11-25 – 2016-11-29 (×5): 3.75 mg via ORAL
  Filled 2016-11-25 (×5): qty 1

## 2016-11-25 MED ORDER — ZOLPIDEM TARTRATE 5 MG PO TABS
2.5000 mg | ORAL_TABLET | Freq: Every day | ORAL | Status: DC | PRN
  Administered 2016-11-26 – 2016-11-27 (×2): 2.5 mg via ORAL
  Filled 2016-11-25 (×2): qty 1

## 2016-11-25 MED ORDER — IOPAMIDOL (ISOVUE-300) INJECTION 61%
INTRAVENOUS | Status: AC
  Filled 2016-11-25: qty 100

## 2016-11-25 MED ORDER — ENOXAPARIN SODIUM 40 MG/0.4ML ~~LOC~~ SOLN
40.0000 mg | SUBCUTANEOUS | Status: DC
  Administered 2016-11-26 – 2016-11-28 (×3): 40 mg via SUBCUTANEOUS
  Filled 2016-11-25 (×5): qty 0.4

## 2016-11-25 MED ORDER — SIMETHICONE 40 MG/0.6ML PO SUSP
40.0000 mg | Freq: Four times a day (QID) | ORAL | Status: DC | PRN
  Filled 2016-11-25: qty 0.6

## 2016-11-25 MED ORDER — FAMOTIDINE 20 MG PO TABS
20.0000 mg | ORAL_TABLET | Freq: Every day | ORAL | Status: DC
  Administered 2016-11-25: 20 mg via ORAL
  Filled 2016-11-25: qty 1

## 2016-11-25 NOTE — H&P (Addendum)
History and Physical    Michelle Keith AGT:[id number] DOB: [birth year] DOA: 11/25/2016  Referring MD/NP/PA: Dr. Rex Kras   PCP: [name], MD   Patient coming from: home  Chief Complaint: abd pain   HPI: Michelle Keith is a 83 y.o. female with known HTN, esophageal stricture, presents with main concern of several days duration of abd cramping, mostly in the epigastric area, intermittent in nature and sharp, non radiating, worse with oral intake solids or liquids, associated with non blood diarrhea, abd bloating, nausea and poor oral intake, weakness. Pt denies any specific alleviating factors, no known sick contact or exposures, no similar events in the past. No fevers, chills, no chest pain or dyspnea, no urinary concerns.   ED Course: Pt is clinically stable, VS notable for T 97.49F, HR up to 108, RR 18, BP 144/71. Blood work notable for WBC 3.7, Na 132, lactic acid 2.18. Imaging studies notable for ? SBO. ED doctor spoke with surgery on call, considered less likely SBP rather possible ileus vs viral etiology.   Review of Systems:  Constitutional: Negative for fever, chills, diaphoresis HENT: Negative for ear pain, nosebleeds, congestion, facial swelling, rhinorrhea, neck pain, neck stiffness and ear discharge.   Eyes: Negative for pain, discharge, redness, itching and visual disturbance.  Respiratory: Negative for cough, choking, chest tightness, shortness of breath, wheezing and stridor.   Cardiovascular: Negative for chest pain, palpitations and leg swelling.  Gastrointestinal: Negative for vomiting.   Genitourinary: Negative for dysuria, urgency, frequency, hematuria, flank pain, decreased urine volume, difficulty urinating and dyspareunia.  Musculoskeletal: Negative for back pain, joint swelling, arthralgias and gait problem.  Neurological: Negative for dizziness, tremors, seizures, syncope, facial asymmetry, speech difficulty, light-headedness, numbness and headaches.  Hematological:  Negative for adenopathy. Does not bruise/bleed easily.  Psychiatric/Behavioral: Negative for hallucinations, behavioral problems, confusion, dysphoric mood, decreased concentration and agitation.   Past Medical History:  Diagnosis Date  . Esophageal stricture   . GERD (gastroesophageal reflux disease)   . Osteopenia   . Right thyroid nodule   . Squamous cell skin cancer   . Syncope     Past Surgical History:  Procedure Laterality Date  . Cataract extraction    . ESOPHAGEAL DILATION     x 4  . Exploratory laparoscopy    . NISSEN FUNDOPLICATION    . TOTAL ABDOMINAL HYSTERECTOMY     Social Hx:  reports that she has never smoked. She has never used smokeless tobacco. She reports that she does not drink alcohol or use drugs.  Allergies  Allergen Reactions  . Aspirin   . Atenolol   . Clarithromycin   . Epinephrine   . Lidocaine   . Omeprazole Magnesium Other (See Comments)    Ask at office visit  . Penicillins   . Pindolol Diarrhea    Fatigue, hip pain  . Sulfonamide Derivatives   . Tetracycline   . Glycol Stearate Palpitations  . Valsartan Palpitations    Family History  Problem Relation Age of Onset  . Multiple myeloma Father        died age 107  . Heart failure Mother        died age 77    Medication Sig  metoprolol tartrate (LOPRESSOR) 25 MG tablet Take 0.5 tablets (12.5 mg total) by mouth daily.  mirtazapine (REMERON) 15 MG tablet Take 3.75 mg by mouth at bedtime.   pantoprazole (PROTONIX) 40 MG tablet Take 40 mg by mouth daily as needed (heartburn).   raloxifene (EVISTA)  60 MG tablet Take 60 mg by mouth daily.  ranitidine (ZANTAC) 150 MG tablet Take 150 mg by mouth 2 (two) times daily as needed for heartburn.  zolpidem (AMBIEN) 10 MG tablet Take 2.5 mg by mouth daily as needed for sleep.    Physical Exam: Vitals:   11/25/16 1345 11/25/16 1348 11/25/16 1619  BP: 124/75  (!) 144/71  Pulse: (!) 116  (!) 108  Resp: 16  18  Temp: 97.9 F (36.6 C)     TempSrc: Oral    SpO2: 97%  99%  Weight:  57.2 kg (126 lb)   Height:  '5\' 5"'  (1.651 m)     Constitutional: NAD, calm, comfortable Vitals:   11/25/16 1345 11/25/16 1348 11/25/16 1619  BP: 124/75  (!) 144/71  Pulse: (!) 116  (!) 108  Resp: 16  18  Temp: 97.9 F (36.6 C)    TempSrc: Oral    SpO2: 97%  99%  Weight:  57.2 kg (126 lb)   Height:  '5\' 5"'  (1.651 m)    Eyes: PERRL, lids and conjunctivae normal ENMT: Mucous membranes are dry. Posterior pharynx clear of any exudate or lesions.Normal dentition.  Neck: normal, supple, no masses, no thyromegaly Respiratory: clear to auscultation bilaterally, no wheezing, no crackles. Normal respiratory effort. No accessory muscle use.  Cardiovascular: Regular rate and rhythm, no rubs / gallops. No extremity edema. 2+ pedal pulses. No carotid bruits.  Abdomen: tenderness in epigastric area, no masses palpated. No hepatosplenomegaly. Bowel sounds positive.  Musculoskeletal: no clubbing / cyanosis. No joint deformity upper and lower extremities. Good ROM, no contractures. Normal muscle tone.  Skin: no rashes, lesions, ulcers. No induration Neurologic: CN 2-12 grossly intact. Sensation intact, DTR normal. Strength 5/5 in all 4.  Psychiatric: Normal judgment and insight. Alert and oriented x 3. Normal mood.   Labs on Admission: I have personally reviewed following labs and imaging studies  CBC:  Recent Labs Lab 11/23/16 1536 11/25/16 1415  WBC 16.4* 3.7*  NEUTROABS 14.2*  --   HGB 12.7 13.2  HCT 38.1 38.3  MCV 90.5 90.1  PLT 287 161   Basic Metabolic Panel:  Recent Labs Lab 11/23/16 1536 11/25/16 1415  NA 135 132*  K 5.0 3.9  CL 103 100*  CO2 23 22  GLUCOSE 144* 135*  BUN 16 13  CREATININE 0.87 0.81  CALCIUM 8.0* 8.2*   Liver Function Tests:  Recent Labs Lab 11/23/16 1536  AST 35  ALT 10*  ALKPHOS 54  BILITOT 1.2  PROT 6.0*  ALBUMIN 3.7   CBG:  Recent Labs Lab 11/23/16 1524 11/25/16 1420  GLUCAP 128* 133*    Urine analysis:    Component Value Date/Time   COLORURINE YELLOW 11/25/2016 1353   APPEARANCEUR HAZY (A) 11/25/2016 1353   LABSPEC 1.008 11/25/2016 1353   PHURINE 5.0 11/25/2016 1353   GLUCOSEU NEGATIVE 11/25/2016 1353   HGBUR NEGATIVE 11/25/2016 1353   BILIRUBINUR NEGATIVE 11/25/2016 1353   KETONESUR NEGATIVE 11/25/2016 1353   PROTEINUR NEGATIVE 11/25/2016 1353   UROBILINOGEN 0.2 08/04/2007 2230   NITRITE NEGATIVE 11/25/2016 1353   LEUKOCYTESUR NEGATIVE 11/25/2016 1353   Radiological Exams on Admission: Ct Abdomen Pelvis W Contrast Result Date: 11/25/2016 The study is positive for small bowel obstruction which is likely due to adhesions. Transition point appears to be in the right lower quadrant in the distal ileum. No CT evidence of bowel ischemia. Atherosclerosis. Status post cholecystectomy and hysterectomy.   EKG: pending   Assessment/Plan Active Problems:  SIRS, Ileus ([place]), diarrhea, nausea  - ? Viral etiology given leukopenia - admit to medical unit - given diarrhea will ask for GI stool panel and C. DIff - place on IVF, supportive care with antiemetics and analgesia as needed - no indication for surgical intervention at this time, hold off on surgical consultation   - check CBC in AM, repeat lactic acid in AM to ensure clearance     Hyponatremia - pre renal in the setting of acute illness  - place on IVF - BMP in AM    HTN, essential - resume home medical regimen        DVT prophylaxis: Lovenox SQ Code Status: Full  Family Communication: Pt updated at bedside Disposition Plan: admit to medical unit  Consults called: None Admission status: Inpatient   Faye Ramsay MD [facility] Pager [phone]  If 7PM-7AM, please contact night-coverage [email or web address] Password [facility]  11/25/2016, 6:24 PM

## 2016-11-25 NOTE — ED Provider Notes (Signed)
[facility] DEPT Provider Note   CSN: [id number] Arrival date & time: 11/25/16  1340     History   Chief Complaint Chief Complaint  Patient presents with  . Diarrhea  . Weakness  . Bloated  . Near Syncope    HPI Michelle Keith is a 83 y.o. female.  83yo F w/ PMH including hypertension, dysautonomia, esophageal stricture who presents with diarrhea and weakness. The patient has had 2 days of nonbloody diarrhea associated with abdominal bloating and discomfort. She reports feeling weak with poor PO intake. She has had nausea but no episodes of vomiting and no fevers. She denies any urinary symptoms, cough/cold symptoms, recent antibiotic use, recent travel, or sick contacts. Her abdomen has felt distended and the discomfort kept her from sleep last night. She denies any history of diverticulitis.   The history is provided by the patient.  Diarrhea    Weakness   Near Syncope     Past Medical History:  Diagnosis Date  . Esophageal stricture   . GERD (gastroesophageal reflux disease)   . Osteopenia   . Right thyroid nodule   . Squamous cell skin cancer   . Syncope     Patient Active Problem List   Diagnosis Date Noted  . Ileus ([place]) 11/25/2016  . Essential hypertension 06/23/2016  . DYSAUTONOMIA 07/10/2009  . SYNCOPE 07/10/2009  . Heart palpitations 07/10/2009  . Chest pain 07/10/2009    Past Surgical History:  Procedure Laterality Date  . Cataract extraction    . ESOPHAGEAL DILATION     x 4  . Exploratory laparoscopy    . NISSEN FUNDOPLICATION    . TOTAL ABDOMINAL HYSTERECTOMY      OB History    No data available       Home Medications    Prior to Admission medications   Medication Sig Start Date End Date Taking? Authorizing Provider  cholecalciferol (VITAMIN D) 1000 UNITS tablet Take 1,000 Units by mouth daily.    Yes [provider]  meclizine (ANTIVERT) 25 MG tablet Take 25 mg by mouth 2 (two) times daily as needed for dizziness or  nausea.   Yes [provider]  metoprolol tartrate (LOPRESSOR) 25 MG tablet Take 0.5 tablets (12.5 mg total) by mouth daily. 09/27/16  Yes Minus Breeding, MD  mirtazapine (REMERON) 15 MG tablet Take 3.75 mg by mouth at bedtime.    Yes [provider]  Multiple Vitamins-Minerals (ICAPS AREDS 2 PO) Take 1 capsule by mouth 2 (two) times daily.   Yes [provider]  pantoprazole (PROTONIX) 40 MG tablet Take 40 mg by mouth daily as needed (heartburn).    Yes [provider]  Probiotic Product (PROBIOTIC DAILY PO) Take 1 capsule by mouth daily.    Yes [provider]  raloxifene (EVISTA) 60 MG tablet Take 60 mg by mouth daily.   Yes [provider]  ranitidine (ZANTAC) 150 MG tablet Take 150 mg by mouth 2 (two) times daily as needed for heartburn.   Yes [provider]  zolpidem (AMBIEN) 10 MG tablet Take 2.5 mg by mouth daily as needed for sleep.    Yes [provider]    Family History Family History  Problem Relation Age of Onset  . Multiple myeloma Father        died age 60  . Heart failure Mother        died age 80    Social History Social History  Substance Use Topics  .  Smoking status: Never Smoker  . Smokeless tobacco: Never Used  . Alcohol use No     Allergies   Aspirin; Atenolol; Clarithromycin; Epinephrine; Lidocaine; Omeprazole magnesium; Penicillins; Pindolol; Sulfonamide derivatives; Tetracycline; Glycol stearate; and Valsartan   Review of Systems Review of Systems  Cardiovascular: Positive for near-syncope.  Gastrointestinal: Positive for diarrhea.  Neurological: Positive for weakness.   10 Systems reviewed and are negative for acute change except as noted in the HPI.   Physical Exam Updated Vital Signs BP (!) 144/71 (BP Location: Left Arm)   Pulse (!) 108   Temp 97.9 F (36.6 C) (Oral)   Resp 18   Ht '5\' 5"'  (1.651 m)   Wt 57.2 kg (126 lb)   SpO2 99%   BMI 20.97 kg/m   Physical Exam   Constitutional: She is oriented to person, place, and time. She appears well-developed and well-nourished. No distress.  HENT:  Head: Normocephalic and atraumatic.  Moist mucous membranes  Eyes: Conjunctivae are normal. Pupils are equal, round, and reactive to light.  Neck: Neck supple.  Cardiovascular: Normal rate, regular rhythm and normal heart sounds.   No murmur heard. Pulmonary/Chest: Effort normal and breath sounds normal.  Abdominal: Soft. Bowel sounds are normal. She exhibits distension (mild). There is no tenderness (LUQ and LLQ).  Musculoskeletal: She exhibits no edema.  Neurological: She is alert and oriented to person, place, and time.  Fluent speech  Skin: Skin is warm and dry.  Psychiatric: She has a normal mood and affect. Judgment normal.  Nursing note and vitals reviewed.    ED Treatments / Results  Labs (all labs ordered are listed, but only abnormal results are displayed) Labs Reviewed  BASIC METABOLIC PANEL - Abnormal; Notable for the following:       Result Value   Sodium 132 (*)    Chloride 100 (*)    Glucose, Bld 135 (*)    Calcium 8.2 (*)    All other components within normal limits  CBC - Abnormal; Notable for the following:    WBC 3.7 (*)    All other components within normal limits  URINALYSIS, ROUTINE W REFLEX MICROSCOPIC - Abnormal; Notable for the following:    APPearance HAZY (*)    All other components within normal limits  CBG MONITORING, ED - Abnormal; Notable for the following:    Glucose-Capillary 133 (*)    All other components within normal limits    EKG  EKG Interpretation  Date/Time:  Friday November 25 2016 14:00:59 EDT Ventricular Rate:  110 PR Interval:    QRS Duration: 93 QT Interval:  328 QTC Calculation: 444 R Axis:   -9 Text Interpretation:  Sinus tachycardia Prominent P waves, nondiagnostic Abnormal R-wave progression, early transition Left ventricular hypertrophy tachycardia new from previous T wave inversions in III  new from previous Confirmed by Theotis Burrow [phone]) on 11/25/2016 2:39:55 PM       Radiology Ct Abdomen Pelvis W Contrast  Result Date: 11/25/2016 CLINICAL DATA:  Abdominal bloating and diarrhea for 2 days. EXAM: CT ABDOMEN AND PELVIS WITH CONTRAST TECHNIQUE: Multidetector CT imaging of the abdomen and pelvis was performed using the standard protocol following bolus administration of intravenous contrast. CONTRAST:  80 ml ISOVUE-300 IOPAMIDOL (ISOVUE-300) INJECTION 61% COMPARISON:  CT abdomen and pelvis 08/05/2007. FINDINGS: Lower chest: Heart size is normal. No pericardial effusion. Trace amount of pleural fluid on the right is noted. No left effusion. Minimal dependent atelectasis is seen. Hepatobiliary: 2 small hepatic cysts are identified. The  liver is otherwise unremarkable. The gallbladder has been removed. Biliary tree appears normal. Pancreas: Unremarkable. No pancreatic ductal dilatation or surrounding inflammatory changes. Spleen: Normal in size without focal abnormality. Adrenals/Urinary Tract: Adrenal glands are unremarkable. Kidneys are normal, without renal calculi, focal lesion, or hydronephrosis. Bladder is unremarkable. Stomach/Bowel: Small bowel loops are dilated up to 4 cm with air-fluid levels present. The terminal ileum is completely decompressed. Transition point appears to be in the right lower quadrant of the abdomen in the distal ileum where loops appear adhesed together. There is no pneumatosis, portal venous gas or free intraperitoneal air. No fluid collection is identified. There is some gas and stool in an otherwise unremarkable colon. The patient is status post Nissen fundoplication. The appendix has been removed. Vascular/Lymphatic: No significant vascular findings are present. No enlarged abdominal or pelvic lymph nodes. Reproductive: Status post hysterectomy. No adnexal masses. Other: Very small fat containing umbilical hernia is seen. Musculoskeletal: No acute or focal bony  abnormality. Lower lumbar degenerative change noted. IMPRESSION: The study is positive for small bowel obstruction which is likely due to adhesions. Transition point appears to be in the right lower quadrant in the distal ileum. No CT evidence of bowel ischemia. Atherosclerosis. Status post cholecystectomy and hysterectomy. Electronically Signed   By: Inge Rise M.D.   On: 11/25/2016 17:04    Procedures Procedures (including critical care time)  Medications Ordered in ED Medications  iopamidol (ISOVUE-300) 61 % injection (not administered)  sodium chloride 0.9 % bolus 1,000 mL (1,000 mLs Intravenous New Bag/Given 11/25/16 1608)  fentaNYL (SUBLIMAZE) injection 50 mcg (50 mcg Intravenous Given 11/25/16 1609)  iopamidol (ISOVUE-300) 61 % injection 100 mL (80 mLs Intravenous Contrast Given 11/25/16 1638)     Initial Impression / Assessment and Plan / ED Course  I have reviewed the triage vital signs and the nursing notes.  Pertinent labs & imaging results that were available during my care of the patient were reviewed by me and considered in my medical decision making (see chart for details).    PT w/ 2d of Diarrhea associated with nausea, weakness, decreased appetite, and abdominal distention with discomfort. She was nontoxic on exam with reassuring vital signs, afebrile. She had left-sided abdominal tenderness with mild distention, no peritonitis. Her lab work shows WBC 3.7, normal hemoglobin, sodium 132, chloride 100, normal creatinine, no evidence of UTI or dehydration. Gave IV fluid bolus, fentanyl, and Zofran. Because of her abdominal tenderness and distention, obtained CT to evaluate for acute process such as diverticulitis.  CT suggestive of small bowel obstruction with transition point and dilated small bowel. I discussed with general surgery, Dr. Elinor Parkinson, who viewed the images and stated that with her diarrhea and the appearance of the images, he suspects she has a reactive ileus and  not a bowel obstruction. He has recommended medical treatment. Discussed admission with hospitalist, Dr. Doyle Askew, and patient admitted for further care.  Final Clinical Impressions(s) / ED Diagnoses   Final diagnoses:  Diarrhea of presumed infectious origin  Ileus [facility])    New Prescriptions New Prescriptions   No medications on file     Elleanor Guyett, Wenda Overland, MD 11/25/16 1806

## 2016-11-25 NOTE — ED Triage Notes (Signed)
Patient c/o multiple diarrheal stools and bloating x 2 days. Patient also c/o feeling weak. Patient also has poor po intake.

## 2016-11-26 ENCOUNTER — Inpatient Hospital Stay (HOSPITAL_COMMUNITY): Payer: Medicare HMO

## 2016-11-26 LAB — CBC
HEMATOCRIT: 34.5 % — AB (ref 36.0–46.0)
Hemoglobin: 11.5 g/dL — ABNORMAL LOW (ref 12.0–15.0)
MCH: 30.4 pg (ref 26.0–34.0)
MCHC: 33.3 g/dL (ref 30.0–36.0)
MCV: 91.3 fL (ref 78.0–100.0)
Platelets: 272 10*3/uL (ref 150–400)
RBC: 3.78 MIL/uL — ABNORMAL LOW (ref 3.87–5.11)
RDW: 14.2 % (ref 11.5–15.5)
WBC: 7.2 10*3/uL (ref 4.0–10.5)

## 2016-11-26 LAB — COMPREHENSIVE METABOLIC PANEL
ALBUMIN: 2.8 g/dL — AB (ref 3.5–5.0)
ALT: 8 U/L — ABNORMAL LOW (ref 14–54)
ANION GAP: 7 (ref 5–15)
AST: 14 U/L — ABNORMAL LOW (ref 15–41)
Alkaline Phosphatase: 50 U/L (ref 38–126)
BUN: 12 mg/dL (ref 6–20)
CHLORIDE: 106 mmol/L (ref 101–111)
CO2: 27 mmol/L (ref 22–32)
Calcium: 7.9 mg/dL — ABNORMAL LOW (ref 8.9–10.3)
Creatinine, Ser: 0.81 mg/dL (ref 0.44–1.00)
GFR calc Af Amer: >60 mL/min (ref >60)
GFR calc non Af Amer: >60 mL/min (ref >60)
GLUCOSE: 100 mg/dL — AB (ref 65–99)
POTASSIUM: 4.1 mmol/L (ref 3.5–5.1)
SODIUM: 140 mmol/L (ref 135–145)
Total Bilirubin: 0.7 mg/dL (ref 0.3–1.2)
Total Protein: 5.1 g/dL — ABNORMAL LOW (ref 6.5–8.1)

## 2016-11-26 LAB — C DIFFICILE QUICK SCREEN W PCR REFLEX
C DIFFICILE (CDIFF) INTERP: NOT DETECTED
C DIFFICILE (CDIFF) TOXIN: NEGATIVE
C Diff antigen: NEGATIVE

## 2016-11-26 LAB — LACTIC ACID, PLASMA: LACTIC ACID, VENOUS: 1.3 mmol/L (ref 0.5–1.9)

## 2016-11-26 MED ORDER — METOPROLOL TARTRATE 12.5 MG HALF TABLET
12.5000 mg | ORAL_TABLET | Freq: Every day | ORAL | Status: DC
  Administered 2016-11-26 – 2016-11-29 (×4): 12.5 mg via ORAL
  Filled 2016-11-26 (×4): qty 1

## 2016-11-26 MED ORDER — FAMOTIDINE 20 MG PO TABS: 20.0000 mg | ORAL_TABLET | Freq: Every day | ORAL | Status: DC

## 2016-11-26 MED ORDER — PANTOPRAZOLE SODIUM 40 MG IV SOLR
40.0000 mg | INTRAVENOUS | Status: DC
Start: 2016-11-26 — End: 2016-11-28
  Administered 2016-11-27: 40 mg via INTRAVENOUS
  Filled 2016-11-26: qty 40

## 2016-11-26 NOTE — Consult Note (Signed)
Reason for Consult:abdominal pain Referring Physician: Dr. Marijean Bravo is an 83 y.o. female.  HPI: The patient is a 83 year old white female who presents with abdominal pain that started this past Wednesday. She states she has had problems with diarrhea for the last 12 years since she had a Nissen fundoplication. She had one episode of nausea but no vomiting. She feels bloated but this is better than yesterday.  Past Medical History:  Diagnosis Date  . Esophageal stricture   . GERD (gastroesophageal reflux disease)   . Osteopenia   . Right thyroid nodule   . Squamous cell skin cancer   . Syncope     Past Surgical History:  Procedure Laterality Date  . Cataract extraction    . ESOPHAGEAL DILATION     x 4  . Exploratory laparoscopy    . NISSEN FUNDOPLICATION    . TOTAL ABDOMINAL HYSTERECTOMY      Family History  Problem Relation Age of Onset  . Multiple myeloma Father        died age 65  . Heart failure Mother        died age 67    Social History:  reports that she has never smoked. She has never used smokeless tobacco. She reports that she does not drink alcohol or use drugs.  Allergies:  Allergies  Allergen Reactions  . Aspirin   . Atenolol   . Clarithromycin   . Epinephrine   . Lidocaine   . Omeprazole Magnesium Other (See Comments)    Ask at office visit  . Penicillins   . Pindolol Diarrhea    Fatigue, hip pain  . Sulfonamide Derivatives   . Tetracycline   . Glycol Stearate Palpitations  . Valsartan Palpitations    Medications: I have reviewed the patient's current medications.  Results for orders placed or performed during the hospital encounter of 11/25/16 (from the past 48 hour(s))  Urinalysis, Routine w reflex microscopic     Status: Abnormal   Collection Time: 11/25/16  1:53 PM  Result Value Ref Range   Color, Urine YELLOW YELLOW   APPearance HAZY (A) CLEAR   Specific Gravity, Urine 1.008 1.005 - 1.030   pH 5.0 5.0 - 8.0   Glucose, UA  NEGATIVE NEGATIVE mg/dL   Hgb urine dipstick NEGATIVE NEGATIVE   Bilirubin Urine NEGATIVE NEGATIVE   Ketones, ur NEGATIVE NEGATIVE mg/dL   Protein, ur NEGATIVE NEGATIVE mg/dL   Nitrite NEGATIVE NEGATIVE   Leukocytes, UA NEGATIVE NEGATIVE  Basic metabolic panel     Status: Abnormal   Collection Time: 11/25/16  2:15 PM  Result Value Ref Range   Sodium 132 (L) 135 - 145 mmol/L   Potassium 3.9 3.5 - 5.1 mmol/L    Comment: REPEATED TO VERIFY DELTA CHECK NOTED    Chloride 100 (L) 101 - 111 mmol/L   CO2 22 22 - 32 mmol/L   Glucose, Bld 135 (H) 65 - 99 mg/dL   BUN 13 6 - 20 mg/dL   Creatinine, Ser 0.81 0.44 - 1.00 mg/dL   Calcium 8.2 (L) 8.9 - 10.3 mg/dL   GFR calc non Af Amer >60 >60 mL/min   GFR calc Af Amer >60 >60 mL/min    Comment: (NOTE) The eGFR has been calculated using the CKD EPI equation. This calculation has not been validated in all clinical situations. eGFR's persistently <60 mL/min signify possible Chronic Kidney Disease.    Anion gap 10 5 - 15  CBC  Status: Abnormal   Collection Time: 11/25/16  2:15 PM  Result Value Ref Range   WBC 3.7 (L) 4.0 - 10.5 K/uL   RBC 4.25 3.87 - 5.11 MIL/uL   Hemoglobin 13.2 12.0 - 15.0 g/dL   HCT 38.3 36.0 - 46.0 %   MCV 90.1 78.0 - 100.0 fL   MCH 31.1 26.0 - 34.0 pg   MCHC 34.5 30.0 - 36.0 g/dL   RDW 14.0 11.5 - 15.5 %   Platelets 298 150 - 400 K/uL  CBG monitoring, ED     Status: Abnormal   Collection Time: 11/25/16  2:20 PM  Result Value Ref Range   Glucose-Capillary 133 (H) 65 - 99 mg/dL  Urinalysis, Routine w reflex microscopic     Status: Abnormal   Collection Time: 11/25/16  8:25 PM  Result Value Ref Range   Color, Urine STRAW (A) YELLOW   APPearance CLEAR CLEAR   Specific Gravity, Urine 1.034 (H) 1.005 - 1.030   pH 6.0 5.0 - 8.0   Glucose, UA NEGATIVE NEGATIVE mg/dL   Hgb urine dipstick NEGATIVE NEGATIVE   Bilirubin Urine NEGATIVE NEGATIVE   Ketones, ur 5 (A) NEGATIVE mg/dL   Protein, ur NEGATIVE NEGATIVE mg/dL    Nitrite NEGATIVE NEGATIVE   Leukocytes, UA NEGATIVE NEGATIVE  Lactic acid, plasma     Status: None   Collection Time: 11/26/16  4:55 AM  Result Value Ref Range   Lactic Acid, Venous 1.3 0.5 - 1.9 mmol/L  Comprehensive metabolic panel     Status: Abnormal   Collection Time: 11/26/16  4:55 AM  Result Value Ref Range   Sodium 140 135 - 145 mmol/L    Comment: DELTA CHECK NOTED REPEATED TO VERIFY    Potassium 4.1 3.5 - 5.1 mmol/L   Chloride 106 101 - 111 mmol/L   CO2 27 22 - 32 mmol/L   Glucose, Bld 100 (H) 65 - 99 mg/dL   BUN 12 6 - 20 mg/dL   Creatinine, Ser 0.81 0.44 - 1.00 mg/dL   Calcium 7.9 (L) 8.9 - 10.3 mg/dL   Total Protein 5.1 (L) 6.5 - 8.1 g/dL   Albumin 2.8 (L) 3.5 - 5.0 g/dL   AST 14 (L) 15 - 41 U/L   ALT 8 (L) 14 - 54 U/L   Alkaline Phosphatase 50 38 - 126 U/L   Total Bilirubin 0.7 0.3 - 1.2 mg/dL   GFR calc non Af Amer >60 >60 mL/min   GFR calc Af Amer >60 >60 mL/min    Comment: (NOTE) The eGFR has been calculated using the CKD EPI equation. This calculation has not been validated in all clinical situations. eGFR's persistently <60 mL/min signify possible Chronic Kidney Disease.    Anion gap 7 5 - 15  CBC     Status: Abnormal   Collection Time: 11/26/16  4:55 AM  Result Value Ref Range   WBC 7.2 4.0 - 10.5 K/uL   RBC 3.78 (L) 3.87 - 5.11 MIL/uL   Hemoglobin 11.5 (L) 12.0 - 15.0 g/dL   HCT 34.5 (L) 36.0 - 46.0 %   MCV 91.3 78.0 - 100.0 fL   MCH 30.4 26.0 - 34.0 pg   MCHC 33.3 30.0 - 36.0 g/dL   RDW 14.2 11.5 - 15.5 %   Platelets 272 150 - 400 K/uL  C difficile quick scan w PCR reflex     Status: None   Collection Time: 11/26/16 10:48 AM  Result Value Ref Range   C Diff antigen  NEGATIVE NEGATIVE   C Diff toxin NEGATIVE NEGATIVE   C Diff interpretation No [name] detected.     Ct Abdomen Pelvis W Contrast  Result Date: 11/25/2016 CLINICAL DATA:  Abdominal bloating and diarrhea for 2 days. EXAM: CT ABDOMEN AND PELVIS WITH CONTRAST TECHNIQUE:  Multidetector CT imaging of the abdomen and pelvis was performed using the standard protocol following bolus administration of intravenous contrast. CONTRAST:  80 ml ISOVUE-300 IOPAMIDOL (ISOVUE-300) INJECTION 61% COMPARISON:  CT abdomen and pelvis 08/05/2007. FINDINGS: Lower chest: Heart size is normal. No pericardial effusion. Trace amount of pleural fluid on the right is noted. No left effusion. Minimal dependent atelectasis is seen. Hepatobiliary: 2 small hepatic cysts are identified. The liver is otherwise unremarkable. The gallbladder has been removed. Biliary tree appears normal. Pancreas: Unremarkable. No pancreatic ductal dilatation or surrounding inflammatory changes. Spleen: Normal in size without focal abnormality. Adrenals/Urinary Tract: Adrenal glands are unremarkable. Kidneys are normal, without renal calculi, focal lesion, or hydronephrosis. Bladder is unremarkable. Stomach/Bowel: Small bowel loops are dilated up to 4 cm with air-fluid levels present. The terminal ileum is completely decompressed. Transition point appears to be in the right lower quadrant of the abdomen in the distal ileum where loops appear adhesed together. There is no pneumatosis, portal venous gas or free intraperitoneal air. No fluid collection is identified. There is some gas and stool in an otherwise unremarkable colon. The patient is status post Nissen fundoplication. The appendix has been removed. Vascular/Lymphatic: No significant vascular findings are present. No enlarged abdominal or pelvic lymph nodes. Reproductive: Status post hysterectomy. No adnexal masses. Other: Very small fat containing umbilical hernia is seen. Musculoskeletal: No acute or focal bony abnormality. Lower lumbar degenerative change noted. IMPRESSION: The study is positive for small bowel obstruction which is likely due to adhesions. Transition point appears to be in the right lower quadrant in the distal ileum. No CT evidence of bowel ischemia.  Atherosclerosis. Status post cholecystectomy and hysterectomy. Electronically Signed   By: Inge Rise M.D.   On: 11/25/2016 17:04   Dg Abd 2 Views  Result Date: 11/26/2016 CLINICAL DATA:  Abdominal distention. EXAM: ABDOMEN - 2 VIEW COMPARISON:  11/25/2016 CT and prior exams FINDINGS: Dilated gas and fluid-filled small bowel loops again noted. There is gas within the colon and rectum. No evidence of pneumoperitoneum. No suspicious calcifications are identified. IMPRESSION: Dilated small bowel loops again identified compatible with small bowel obstruction. No evidence of pneumoperitoneum. Electronically Signed   By: Margarette Canada M.D.   On: 11/26/2016 13:00    Review of Systems  HENT: Negative.   Eyes: Negative.   Respiratory: Negative.   Cardiovascular: Negative.   Gastrointestinal: Positive for abdominal pain, diarrhea and heartburn. Negative for vomiting.  Genitourinary: Negative.   Musculoskeletal: Negative.   Skin: Negative.   Neurological: Positive for weakness.  Endo/Heme/Allergies: Negative.   Psychiatric/Behavioral: Negative.    Blood pressure (!) 111/50, pulse 100, temperature 98.6 F (37 C), temperature source Oral, resp. rate 16, height '5\' 5"'  (1.651 m), weight 57.2 kg (126 lb), SpO2 98 %. Physical Exam  Constitutional: She is oriented to person, place, and time. She appears well-developed and well-nourished. No distress.  HENT:  Head: Normocephalic and atraumatic.  Mouth/Throat: No oropharyngeal exudate.  Eyes: Conjunctivae and EOM are normal. Pupils are equal, round, and reactive to light.  Neck: Normal range of motion. Neck supple. No tracheal deviation present.  Cardiovascular: Normal rate, regular rhythm and normal heart sounds.   Respiratory: Effort normal and breath  sounds normal.  GI: Soft. Bowel sounds are normal. She exhibits distension. There is tenderness.  Musculoskeletal: Normal range of motion. She exhibits no edema or deformity.  Neurological: She is  alert and oriented to person, place, and time. Coordination normal.  Skin: Skin is warm and dry. No rash noted.  Psychiatric: She has a normal mood and affect. Her behavior is normal. Thought content normal.    Assessment/Plan: The patient appears to have a partial sbo most likely from adhesions from previous surgery. I would recommend bowel rest and repeat xrays tomorrow. Hopefully since it is partial it may resolve without surgery. No indication for urgent surgery at this point  [place] 11/26/2016, 2:00 PM

## 2016-11-26 NOTE — Progress Notes (Signed)
Patient ID: Michelle Keith, female   DOB: 1931/08/15, 83 y.o.   MRN: [id number]   PROGRESS NOTE    Michelle Keith  VVO:[id number] DOB: July 12, 1931 DOA: 11/25/2016  PCP: [name], MD   Brief Narrative:  Pt is 83 yo female with HTN and hx of esophageal stricture, presented to [facility] ED with main concern of several days duration of poor oral intake, intermittent abd spasms, non blood diarrhea, nausea and weakness. Pt reports progressively worsening abd bloating that she initially noted in April this year and it is getting progressively worse. Imagine studies in ED, worrisome for potential SBO but after discussion with surgery team, likely viral ileitis, ? Enteritis. TR[facility] asked to admit for further evaluation.   Assessment & Plan:   Active Problems:   SIRS, Ileus ([place]), diarrhea, nausea  - ? Viral etiology given leukopenia - pt reports no significant improvement in symptoms, still with nausea and poor oral intake, abd bloating - keep on IVF, change PO meds to IV for now - provide analgesia and antiemetics as needed - stool panel still pending - obtain abd xray for follow up - may need to change to NPO pending clinical progress - WBC indicated leukopenia resolved  - will consult with surgery  - CBC in AM    Hyponatremia - pre renal in the setting of acute illness  - improved with IVF but worried if we stop IVF, it will get worse - keep on IVF for now - BMP in AM    Tachycardia - reactive from acute illness - keep on IVF, supportive care    HTN, essential - reasonable inpatient control   DVT prophylaxis: Lovenox SQ Code Status: Full  Family Communication: Patient at bedside  Disposition Plan: to be determines, pt still nt eating well, no t progressing well, needs abd xray to follow progression   Consultants:   None  Procedures:   None  Antimicrobials:   None  Subjective: Pt reports persistent abd bloating and nausea, poor oral intake. Says she thinks she would be able  to eat more if her abd was less distended. Still with diarrhea.   Objective: Vitals:   11/25/16 1852 11/25/16 1917 11/25/16 2157 11/26/16 0617  BP: 123/69 117/80 115/70 (!) 111/50  Pulse: (!) 120 (!) 119 (!) 113 100  Resp: 14 16 16 16   Temp:  98 F (36.7 C) 98.1 F (36.7 C) 98.6 F (37 C)  TempSrc:  Oral Axillary Oral  SpO2: 100% 98% 98% 98%  Weight:      Height:        Intake/Output Summary (Last 24 hours) at 11/26/16 1204 Last data filed at 11/26/16 1103  Gross per 24 hour  Intake          2631.25 ml  Output             1250 ml  Net          1381.25 ml   Filed Weights   11/25/16 1348  Weight: 57.2 kg (126 lb)    Examination:  General exam: Appears calm, NAD Respiratory system: Clear to auscultation. Respiratory effort normal. Cardiovascular system: Tachycardic, No JVD, murmurs, rubs, gallops or clicks. No pedal edema. Gastrointestinal system: Abdomen is slightly distended but soft, very hypoactive bowel sounds, tender in epigastric area Central nervous system: Alert and oriented. No focal neurological deficits. Extremities: Symmetric 5 x 5 power. Skin: No rashes, lesions or ulcers Psychiatry: Judgement and insight appear normal. Mood & affect appropriate.  Data Reviewed: I have personally reviewed following labs and imaging studies  CBC:  Recent Labs Lab 11/23/16 1536 11/25/16 1415 11/26/16 0455  WBC 16.4* 3.7* 7.2  NEUTROABS 14.2*  --   --   HGB 12.7 13.2 11.5*  HCT 38.1 38.3 34.5*  MCV 90.5 90.1 91.3  PLT 287 298 852   Basic Metabolic Panel:  Recent Labs Lab 11/23/16 1536 11/25/16 1415 11/26/16 0455  NA 135 132* 140  K 5.0 3.9 4.1  CL 103 100* 106  CO2 23 22 27   GLUCOSE 144* 135* 100*  BUN 16 13 12   CREATININE 0.87 0.81 0.81  CALCIUM 8.0* 8.2* 7.9*   Liver Function Tests:  Recent Labs Lab 11/23/16 1536 11/26/16 0455  AST 35 14*  ALT 10* 8*  ALKPHOS 54 50  BILITOT 1.2 0.7  PROT 6.0* 5.1*  ALBUMIN 3.7 2.8*   CBG:  Recent  Labs Lab 11/23/16 1524 11/25/16 1420  GLUCAP 128* 133*   Urine analysis:    Component Value Date/Time   COLORURINE STRAW (A) 11/25/2016 2025   APPEARANCEUR CLEAR 11/25/2016 2025   LABSPEC 1.034 (H) 11/25/2016 2025   PHURINE 6.0 11/25/2016 2025   GLUCOSEU NEGATIVE 11/25/2016 2025   HGBUR NEGATIVE 11/25/2016 2025   BILIRUBINUR NEGATIVE 11/25/2016 2025   KETONESUR 5 (A) 11/25/2016 2025   PROTEINUR NEGATIVE 11/25/2016 2025   UROBILINOGEN 0.2 08/04/2007 2230   NITRITE NEGATIVE 11/25/2016 2025   LEUKOCYTESUR NEGATIVE 11/25/2016 2025   Radiology Studies: Ct Abdomen Pelvis W Contrast  Result Date: 11/25/2016 CLINICAL DATA:  Abdominal bloating and diarrhea for 2 days. EXAM: CT ABDOMEN AND PELVIS WITH CONTRAST TECHNIQUE: Multidetector CT imaging of the abdomen and pelvis was performed using the standard protocol following bolus administration of intravenous contrast. CONTRAST:  80 ml ISOVUE-300 IOPAMIDOL (ISOVUE-300) INJECTION 61% COMPARISON:  CT abdomen and pelvis 08/05/2007. FINDINGS: Lower chest: Heart size is normal. No pericardial effusion. Trace amount of pleural fluid on the right is noted. No left effusion. Minimal dependent atelectasis is seen. Hepatobiliary: 2 small hepatic cysts are identified. The liver is otherwise unremarkable. The gallbladder has been removed. Biliary tree appears normal. Pancreas: Unremarkable. No pancreatic ductal dilatation or surrounding inflammatory changes. Spleen: Normal in size without focal abnormality. Adrenals/Urinary Tract: Adrenal glands are unremarkable. Kidneys are normal, without renal calculi, focal lesion, or hydronephrosis. Bladder is unremarkable. Stomach/Bowel: Small bowel loops are dilated up to 4 cm with air-fluid levels present. The terminal ileum is completely decompressed. Transition point appears to be in the right lower quadrant of the abdomen in the distal ileum where loops appear adhesed together. There is no pneumatosis, portal venous gas  or free intraperitoneal air. No fluid collection is identified. There is some gas and stool in an otherwise unremarkable colon. The patient is status post Nissen fundoplication. The appendix has been removed. Vascular/Lymphatic: No significant vascular findings are present. No enlarged abdominal or pelvic lymph nodes. Reproductive: Status post hysterectomy. No adnexal masses. Other: Very small fat containing umbilical hernia is seen. Musculoskeletal: No acute or focal bony abnormality. Lower lumbar degenerative change noted. IMPRESSION: The study is positive for small bowel obstruction which is likely due to adhesions. Transition point appears to be in the right lower quadrant in the distal ileum. No CT evidence of bowel ischemia. Atherosclerosis. Status post cholecystectomy and hysterectomy. Electronically Signed   By: Inge Rise M.D.   On: 11/25/2016 17:04   Scheduled Meds: . cholecalciferol  1,000 Units Oral Daily  . enoxaparin (LOVENOX) injection  40 mg  Subcutaneous Q24H  . famotidine  20 mg Oral Daily  . metoprolol tartrate  12.5 mg Oral Daily  . mirtazapine  3.75 mg Oral QHS  . raloxifene  60 mg Oral Daily   Continuous Infusions: . sodium chloride 1,000 mL (11/25/16 2015)    LOS: 1 day   Time spent: 35 minutes   Faye Ramsay, MD [facility] Pager [phone]  If 7PM-7AM, please contact night-coverage [email or web address] Password [email or web address] 11/26/2016, 12:04 PM

## 2016-11-27 ENCOUNTER — Inpatient Hospital Stay (HOSPITAL_COMMUNITY): Payer: Medicare HMO

## 2016-11-27 LAB — GASTROINTESTINAL PANEL BY PCR, STOOL (REPLACES STOOL CULTURE)

## 2016-11-27 LAB — CBC
HCT: 31.4 % — ABNORMAL LOW (ref 36.0–46.0)
HEMOGLOBIN: 10.4 g/dL — AB (ref 12.0–15.0)
MCH: 30.5 pg (ref 26.0–34.0)
MCHC: 33.1 g/dL (ref 30.0–36.0)
MCV: 92.1 fL (ref 78.0–100.0)
Platelets: 280 10*3/uL (ref 150–400)
RBC: 3.41 MIL/uL — AB (ref 3.87–5.11)
RDW: 14.5 % (ref 11.5–15.5)
WBC: 7.9 10*3/uL (ref 4.0–10.5)

## 2016-11-27 LAB — URINE CULTURE

## 2016-11-27 LAB — BASIC METABOLIC PANEL
ANION GAP: 6 (ref 5–15)
BUN: 8 mg/dL (ref 6–20)
CHLORIDE: 111 mmol/L (ref 101–111)
CO2: 24 mmol/L (ref 22–32)
Calcium: 7.8 mg/dL — ABNORMAL LOW (ref 8.9–10.3)
Creatinine, Ser: 0.7 mg/dL (ref 0.44–1.00)
GFR calc non Af Amer: >60 mL/min (ref >60)
Glucose, Bld: 84 mg/dL (ref 65–99)
POTASSIUM: 3.7 mmol/L (ref 3.5–5.1)
SODIUM: 141 mmol/L (ref 135–145)

## 2016-11-27 NOTE — Progress Notes (Signed)
PT Cancellation Note  Patient Details Name: Michelle Keith MRN: [id number] DOB: 05-31-1932   Cancelled Treatment:    Reason Eval/Treat Not Completed: Other (comment) Wants to wait until visitors are gone. Will check back later.  [name] 11/27/2016, 10:37 AM Tresa Endo PT [phone]

## 2016-11-27 NOTE — Progress Notes (Signed)
Patient ID: Michelle Keith, female   DOB: [birth year], 83 y.o.   MRN: [id number]   PROGRESS NOTE    Michelle Keith  GYK:[id number] DOB: 03-Dec-1931 DOA: 11/25/2016  PCP: [name], MD   Brief Narrative:  Pt is 83 yo female with HTN and hx of esophageal stricture, presented to [facility] ED with main concern of several days duration of poor oral intake, intermittent abd spasms, non blood diarrhea, nausea and weakness. Pt reports progressively worsening abd bloating that she initially noted in April this year and it is getting progressively worse. Imagine studies in ED, worrisome for potential SBO but after discussion with surgery team, likely viral ileitis, ? Enteritis. TRH asked to admit for further evaluation.   Assessment & Plan:   Active Problems:   SIRS, Ileus ([place]), diarrhea, nausea  - unclear etiology, ? Viral, ileus, partial small bowel obstruction  - pt reports feeling bit better this AM but still with persistent diarrhea  - no indication for surgical intervention at this time - appreciate surgery team following - GI consulted for assistance as well  - continue to provide supportive care with IVF, antiemetics as needed  - advance diet to soft     Hyponatremia - suspect pre renal etiology - IVF provided and Na is now WNL     Tachycardia - reactive, from acute illness - resolved with supportive care     HTN, essential - reasonably stable   DVT prophylaxis: Lovenox SQ Code Status: Full  Family Communication: patient and family at bedside  Disposition Plan: home in 1-2 days   Consultants:   Surgery  GI  Procedures:   None  Antimicrobials:   None  Subjective: Pt reports ongoing diarrhea but overall better.   Objective: Vitals:   11/26/16 0617 11/26/16 1507 11/26/16 2035 11/27/16 0439  BP: (!) 111/50 (!) 113/53 (!) 136/51 (!) 109/42  Pulse: 100 99 91 98  Resp: 16 16 18 16   Temp: 98.6 F (37 C) 98 F (36.7 C) 98.3 F (36.8 C) 98.9 F (37.2 C)  TempSrc:  Oral Oral Oral Oral  SpO2: 98% 98% 98% 96%  Weight:    60.3 kg (132 lb 15 oz)  Height:        Intake/Output Summary (Last 24 hours) at 11/27/16 1143 Last data filed at 11/27/16 1009  Gross per 24 hour  Intake             2220 ml  Output             1000 ml  Net             1220 ml   Filed Weights   11/25/16 1348 11/27/16 0439  Weight: 57.2 kg (126 lb) 60.3 kg (132 lb 15 oz)    Physical Exam  Constitutional: Appears well-developed and well-nourished. No distress.  Pulmonary: Effort and breath sounds normal, no stridor, rhonchi, wheezes, rales.  Abdominal: Soft. BS +,  mild distension, no tenderness, rebound or guarding.  Musculoskeletal: Normal range of motion. No edema and no tenderness.  Lymphadenopathy: No lymphadenopathy noted, cervical, inguinal.  Data Reviewed: I have personally reviewed following labs and imaging studies  CBC:  Recent Labs Lab 11/23/16 1536 11/25/16 1415 11/26/16 0455 11/27/16 0506  WBC 16.4* 3.7* 7.2 7.9  NEUTROABS 14.2*  --   --   --   HGB 12.7 13.2 11.5* 10.4*  HCT 38.1 38.3 34.5* 31.4*  MCV 90.5 90.1 91.3 92.1  PLT 287 298 272 280  Basic Metabolic Panel:  Recent Labs Lab 11/23/16 1536 11/25/16 1415 11/26/16 0455 11/27/16 0506  NA 135 132* 140 141  K 5.0 3.9 4.1 3.7  CL 103 100* 106 111  CO2 23 22 27 24   GLUCOSE 144* 135* 100* 84  BUN 16 13 12 8   CREATININE 0.87 0.81 0.81 0.70  CALCIUM 8.0* 8.2* 7.9* 7.8*   Liver Function Tests:  Recent Labs Lab 11/23/16 1536 11/26/16 0455  AST 35 14*  ALT 10* 8*  ALKPHOS 54 50  BILITOT 1.2 0.7  PROT 6.0* 5.1*  ALBUMIN 3.7 2.8*   CBG:  Recent Labs Lab 11/23/16 1524 11/25/16 1420  GLUCAP 128* 133*   Urine analysis:    Component Value Date/Time   COLORURINE STRAW (A) 11/25/2016 2025   APPEARANCEUR CLEAR 11/25/2016 2025   LABSPEC 1.034 (H) 11/25/2016 2025   PHURINE 6.0 11/25/2016 2025   GLUCOSEU NEGATIVE 11/25/2016 2025   HGBUR NEGATIVE 11/25/2016 2025   BILIRUBINUR  NEGATIVE 11/25/2016 2025   KETONESUR 5 (A) 11/25/2016 2025   PROTEINUR NEGATIVE 11/25/2016 2025   UROBILINOGEN 0.2 08/04/2007 2230   NITRITE NEGATIVE 11/25/2016 2025   LEUKOCYTESUR NEGATIVE 11/25/2016 2025   Radiology Studies: Ct Abdomen Pelvis W Contrast  Result Date: 11/25/2016 CLINICAL DATA:  Abdominal bloating and diarrhea for 2 days. EXAM: CT ABDOMEN AND PELVIS WITH CONTRAST TECHNIQUE: Multidetector CT imaging of the abdomen and pelvis was performed using the standard protocol following bolus administration of intravenous contrast. CONTRAST:  80 ml ISOVUE-300 IOPAMIDOL (ISOVUE-300) INJECTION 61% COMPARISON:  CT abdomen and pelvis 08/05/2007. FINDINGS: Lower chest: Heart size is normal. No pericardial effusion. Trace amount of pleural fluid on the right is noted. No left effusion. Minimal dependent atelectasis is seen. Hepatobiliary: 2 small hepatic cysts are identified. The liver is otherwise unremarkable. The gallbladder has been removed. Biliary tree appears normal. Pancreas: Unremarkable. No pancreatic ductal dilatation or surrounding inflammatory changes. Spleen: Normal in size without focal abnormality. Adrenals/Urinary Tract: Adrenal glands are unremarkable. Kidneys are normal, without renal calculi, focal lesion, or hydronephrosis. Bladder is unremarkable. Stomach/Bowel: Small bowel loops are dilated up to 4 cm with air-fluid levels present. The terminal ileum is completely decompressed. Transition point appears to be in the right lower quadrant of the abdomen in the distal ileum where loops appear adhesed together. There is no pneumatosis, portal venous gas or free intraperitoneal air. No fluid collection is identified. There is some gas and stool in an otherwise unremarkable colon. The patient is status post Nissen fundoplication. The appendix has been removed. Vascular/Lymphatic: No significant vascular findings are present. No enlarged abdominal or pelvic lymph nodes. Reproductive: Status  post hysterectomy. No adnexal masses. Other: Very small fat containing umbilical hernia is seen. Musculoskeletal: No acute or focal bony abnormality. Lower lumbar degenerative change noted. IMPRESSION: The study is positive for small bowel obstruction which is likely due to adhesions. Transition point appears to be in the right lower quadrant in the distal ileum. No CT evidence of bowel ischemia. Atherosclerosis. Status post cholecystectomy and hysterectomy. Electronically Signed   By: Inge Rise M.D.   On: 11/25/2016 17:04   Dg Abd 2 Views  Result Date: 11/27/2016 CLINICAL DATA:  Upper abdominal pain, nausea, ileus EXAM: ABDOMEN - 2 VIEW COMPARISON:  11/26/2016 FINDINGS: Mildly prominent loops of small bowel in the central abdomen. Colon is not decompressed. Air to the rectum. Appearance remains compatible with adynamic small bowel ileus favored over small bowel obstruction. No evidence of free air on the lateral decubitus view.  Degenerative changes of the lumbar spine. IMPRESSION: Mildly prominent loops of small bowel in the central abdomen, favoring adynamic small bowel ileus. No free air. Electronically Signed   By: Julian Hy M.D.   On: 11/27/2016 10:21   Dg Abd 2 Views  Result Date: 11/26/2016 CLINICAL DATA:  Abdominal distention. EXAM: ABDOMEN - 2 VIEW COMPARISON:  11/25/2016 CT and prior exams FINDINGS: Dilated gas and fluid-filled small bowel loops again noted. There is gas within the colon and rectum. No evidence of pneumoperitoneum. No suspicious calcifications are identified. IMPRESSION: Dilated small bowel loops again identified compatible with small bowel obstruction. No evidence of pneumoperitoneum. Electronically Signed   By: Margarette Canada M.D.   On: 11/26/2016 13:00   Scheduled Meds: . cholecalciferol  1,000 Units Oral Daily  . enoxaparin (LOVENOX) injection  40 mg Subcutaneous Q24H  . metoprolol tartrate  12.5 mg Oral Daily  . mirtazapine  3.75 mg Oral QHS  . pantoprazole  (PROTONIX) IV  40 mg Intravenous Q24H  . raloxifene  60 mg Oral Daily   Continuous Infusions: . sodium chloride 1,000 mL (11/25/16 2015)    LOS: 2 days   Time spent: 35 minutes   Faye Ramsay, MD [facility] Pager [phone]  If 7PM-7AM, please contact night-coverage [email or web address] Password [email or web address] 11/27/2016, 11:43 AM

## 2016-11-27 NOTE — Consult Note (Signed)
Referring Provider: Dr. Zella Richer Primary Care Physician:  [name], MD Primary Gastroenterologist:  Dr. [name]   Reason for Consultation:  Diarrhea, possible partial small bowel obstruction versus ileus  HPI: Michelle Keith is a 83 y.o. female past medical history of Nissen fundoplication, history of esophageal stricture, chronic diarrhea admitted to the hospital with abdominal pain. CT scan on admission 11/25/2016 showed possible small bowel obstruction with transition point and right lower quadrant at the distal ileum. Patient seen by surgical team. They requested GI evaluation for ongoing diarrhea.  Patient initially presented to ED on 11/23/2016 with diarrhea and loss of consciousness. Patient was subsequently discharged home. Patient continued to feel weakness and had ongoing diarrhea. Patient was also complaining of abdominal bloating and discomfort. She came back to ED on 11/25/2016 where CT scan was performed which revealed possible small bowel obstruction. Patient has been treated conservatively. Her symptoms are improving. Abdominal distention is improving. She denied any vomiting. Complaining of some nausea. She continues to have diarrhea with 4-5 loose stools per day.  Patient with history of on and off diarrhea since 2006 when she had a Nissen fundoplication. According to patient she had severe diarrhea initially which got better over  time. Patient now has episodes of diarrhea every 10-14 days which she described as loose stools with 4-5 bowel movements for 1 day. Patient takes Imodium with improvement in symptoms. Patient denied any blood in the stool or black stool.  Last colonoscopy several years ago was normal according to patient.  Past Medical History:  Diagnosis Date  . Esophageal stricture   . GERD (gastroesophageal reflux disease)   . Osteopenia   . Right thyroid nodule   . Squamous cell skin cancer   . Syncope     Past Surgical History:  Procedure Laterality  Date  . Cataract extraction    . ESOPHAGEAL DILATION     x 4  . Exploratory laparoscopy    . NISSEN FUNDOPLICATION    . TOTAL ABDOMINAL HYSTERECTOMY      Prior to Admission medications   Medication Sig Start Date End Date Taking? Authorizing Provider  cholecalciferol (VITAMIN D) 1000 UNITS tablet Take 1,000 Units by mouth daily.    Yes [provider]  meclizine (ANTIVERT) 25 MG tablet Take 25 mg by mouth 2 (two) times daily as needed for dizziness or nausea.   Yes [provider]  metoprolol tartrate (LOPRESSOR) 25 MG tablet Take 0.5 tablets (12.5 mg total) by mouth daily. 09/27/16  Yes Minus Breeding, MD  mirtazapine (REMERON) 15 MG tablet Take 3.75 mg by mouth at bedtime.    Yes [provider]  Multiple Vitamins-Minerals (ICAPS AREDS 2 PO) Take 1 capsule by mouth 2 (two) times daily.   Yes [provider]  pantoprazole (PROTONIX) 40 MG tablet Take 40 mg by mouth daily as needed (heartburn).    Yes [provider]  Probiotic Product (PROBIOTIC DAILY PO) Take 1 capsule by mouth daily.    Yes [provider]  raloxifene (EVISTA) 60 MG tablet Take 60 mg by mouth daily.   Yes [provider]  ranitidine (ZANTAC) 150 MG tablet Take 150 mg by mouth 2 (two) times daily as needed for heartburn.   Yes [provider]  zolpidem (AMBIEN) 10 MG tablet Take 2.5 mg by mouth daily as needed for sleep.    Yes [provider]    Scheduled Meds: . cholecalciferol  1,000 Units Oral Daily  . enoxaparin (LOVENOX) injection  40 mg Subcutaneous Q24H  . metoprolol tartrate  12.5 mg Oral Daily  . mirtazapine  3.75 mg Oral QHS  . pantoprazole (PROTONIX) IV  40 mg Intravenous Q24H  . raloxifene  60 mg Oral Daily   Continuous Infusions: . sodium chloride 1,000 mL (11/25/16 2015)   PRN Meds:.meclizine, ondansetron **OR** ondansetron (ZOFRAN) IV, simethicone, traMADol, zolpidem  Allergies as of 11/25/2016 - Review Complete  11/25/2016  Allergen Reaction Noted  . Aspirin    . Atenolol  06/23/2016  . Clarithromycin    . Epinephrine    . Lidocaine    . Omeprazole magnesium Other (See Comments) 07/26/2015  . Penicillins    . Pindolol Diarrhea 04/07/2016  . Sulfonamide derivatives    . Tetracycline    . Glycol stearate Palpitations 11/22/2013  . Valsartan Palpitations 07/14/2015    Family History  Problem Relation Age of Onset  . Multiple myeloma Father        died age 1  . Heart failure Mother        died age 10    Social History   Social History  . Marital status: Married    Spouse name: N/A  . Number of children: 2  . Years of education: N/A   Occupational History  . Not on file.   Social History Main Topics  . Smoking status: Never Smoker  . Smokeless tobacco: Never Used  . Alcohol use No  . Drug use: No  . Sexual activity: Not on file   Other Topics Concern  . Not on file   Social History Narrative   Lives alone.  Husband in a nursing home after a stroke.      Review of Systems: Review of Systems  Constitutional: Positive for chills. Negative for fever and weight loss.  HENT: Positive for hearing loss. Negative for ear discharge, ear pain and tinnitus.   Eyes: Negative for blurred vision, pain and discharge.  Respiratory: Negative for cough, hemoptysis and sputum production.   Cardiovascular: Negative for chest pain and palpitations.  Gastrointestinal: Positive for abdominal pain, diarrhea and nausea. Negative for blood in stool, melena and vomiting.  Genitourinary: Negative for dysuria and urgency.  Musculoskeletal: Positive for back pain and myalgias.  Skin: Negative for rash.  Neurological: Positive for weakness. Negative for focal weakness and seizures.  Endo/Heme/Allergies: Does not bruise/bleed easily.  Psychiatric/Behavioral: Negative for hallucinations and suicidal ideas.    Physical Exam: Vital signs: Vitals:   11/26/16 2035 11/27/16 0439  BP: (!) 136/51 (!)  109/42  Pulse: 91 98  Resp: 18 16  Temp: 98.3 F (36.8 C) 98.9 F (37.2 C)   Last BM Date: 11/27/16 Physical Exam  Constitutional: She is oriented to person, place, and time. She appears well-developed and well-nourished. No distress.  HENT:  Head: Normocephalic and atraumatic.  Mouth/Throat: Oropharynx is clear and moist. No oropharyngeal exudate.  Eyes: EOM are normal. No scleral icterus.  Neck: Normal range of motion. Neck supple. No thyromegaly present.  Cardiovascular: Normal rate, regular rhythm and normal heart sounds.   Pulmonary/Chest: Effort normal and breath sounds normal. No respiratory distress.  Abdominal: Soft. Bowel sounds are normal. She exhibits no distension. There is tenderness. There is guarding. There is no rebound.  Patient has generalized abdominal tenderness with voluntary guarding.  Musculoskeletal: Normal range of motion. She exhibits no edema.  Neurological: She is alert and oriented to person, place, and time.  Skin: Skin is warm. No erythema.  Psychiatric: She has a normal mood  and affect. Her behavior is normal. Thought content normal.  Vitals reviewed.  GI:  Lab Results:  Recent Labs  11/25/16 1415 11/26/16 0455 11/27/16 0506  WBC 3.7* 7.2 7.9  HGB 13.2 11.5* 10.4*  HCT 38.3 34.5* 31.4*  PLT 298 272 280   BMET  Recent Labs  11/25/16 1415 11/26/16 0455 11/27/16 0506  NA 132* 140 141  K 3.9 4.1 3.7  CL 100* 106 111  CO2 _0 GLUCOSE 135* 100* 84  BUN _1 CREATININE 0.81 0.81 0.70  CALCIUM 8.2* 7.9* 7.8*   LFT  Recent Labs  11/26/16 0455  PROT 5.1*  ALBUMIN 2.8*  AST 14*  ALT 8*  ALKPHOS 50  BILITOT 0.7   PT/INR No results for input(s): LABPROT, INR in the last 72 hours.   Studies/Results: Ct Abdomen Pelvis W Contrast  Result Date: 11/25/2016 CLINICAL DATA:  Abdominal bloating and diarrhea for 2 days. EXAM: CT ABDOMEN AND PELVIS WITH CONTRAST TECHNIQUE: Multidetector CT imaging of the abdomen and pelvis was  performed using the standard protocol following bolus administration of intravenous contrast. CONTRAST:  80 ml ISOVUE-300 IOPAMIDOL (ISOVUE-300) INJECTION 61% COMPARISON:  CT abdomen and pelvis 08/05/2007. FINDINGS: Lower chest: Heart size is normal. No pericardial effusion. Trace amount of pleural fluid on the right is noted. No left effusion. Minimal dependent atelectasis is seen. Hepatobiliary: 2 small hepatic cysts are identified. The liver is otherwise unremarkable. The gallbladder has been removed. Biliary tree appears normal. Pancreas: Unremarkable. No pancreatic ductal dilatation or surrounding inflammatory changes. Spleen: Normal in size without focal abnormality. Adrenals/Urinary Tract: Adrenal glands are unremarkable. Kidneys are normal, without renal calculi, focal lesion, or hydronephrosis. Bladder is unremarkable. Stomach/Bowel: Small bowel loops are dilated up to 4 cm with air-fluid levels present. The terminal ileum is completely decompressed. Transition point appears to be in the right lower quadrant of the abdomen in the distal ileum where loops appear adhesed together. There is no pneumatosis, portal venous gas or free intraperitoneal air. No fluid collection is identified. There is some gas and stool in an otherwise unremarkable colon. The patient is status post Nissen fundoplication. The appendix has been removed. Vascular/Lymphatic: No significant vascular findings are present. No enlarged abdominal or pelvic lymph nodes. Reproductive: Status post hysterectomy. No adnexal masses. Other: Very small fat containing umbilical hernia is seen. Musculoskeletal: No acute or focal bony abnormality. Lower lumbar degenerative change noted. IMPRESSION: The study is positive for small bowel obstruction which is likely due to adhesions. Transition point appears to be in the right lower quadrant in the distal ileum. No CT evidence of bowel ischemia. Atherosclerosis. Status post cholecystectomy and  hysterectomy. Electronically Signed   By: Inge Rise M.D.   On: 11/25/2016 17:04   Dg Abd 2 Views  Result Date: 11/27/2016 CLINICAL DATA:  Upper abdominal pain, nausea, ileus EXAM: ABDOMEN - 2 VIEW COMPARISON:  11/26/2016 FINDINGS: Mildly prominent loops of small bowel in the central abdomen. Colon is not decompressed. Air to the rectum. Appearance remains compatible with adynamic small bowel ileus favored over small bowel obstruction. No evidence of free air on the lateral decubitus view. Degenerative changes of the lumbar spine. IMPRESSION: Mildly prominent loops of small bowel in the central abdomen, favoring adynamic small bowel ileus. No free air. Electronically Signed   By: Julian Hy M.D.   On: 11/27/2016 10:21   Dg Abd 2 Views  Result Date: 11/26/2016 CLINICAL DATA:  Abdominal distention. EXAM: ABDOMEN - 2 VIEW COMPARISON:  11/25/2016 CT and prior exams FINDINGS: Dilated gas and fluid-filled small bowel loops again noted. There is gas within the colon and rectum. No evidence of pneumoperitoneum. No suspicious calcifications are identified. IMPRESSION: Dilated small bowel loops again identified compatible with small bowel obstruction. No evidence of pneumoperitoneum. Electronically Signed   By: Margarette Canada M.D.   On: 11/26/2016 13:00    Impression/Plan: - Acute on chronic diarrhea. Etiology undetermined. [name] negative. - Partial small bowel obstruction versus ileus. Improving.  - Abdominal bloating, abdominal cramps and on and off diarrhea. Possible underlying irritable bowel syndrome. - History of Nissen fundoplication  Recommendations ------------------------ - Follow GI pathogen panel. [name] is negative. Consider trial of Bentyl and Imodium after GI pathogen panel is negative. May consider trial of Colestid for idiopathic bile salt diarrhea. Patient is status post cholecystectomy based on CT scan report. Patient was advised to ambulate with assistance. Advance  diet per surgery recommendations. GI will follow    LOS: 2 days   Otis Brace  MD, FACP 11/27/2016, 11:13 AM  Pager [phone] If no answer or after 5 PM call [phone]

## 2016-11-27 NOTE — Progress Notes (Signed)
Assessment Active Problems:   Diarrheal illness with partial sbo vs ileus pattern on x-ray- C. Diff negative; continues to have diarrhea; tolerating clear liquid diet well; I do not think this is a problem that requires an operation; Dr. [name] is her gastroenterologist.  Plan:  Advance diet.  Check x-rays.  Ask GI to see.   LOS: 2 days        Chief Complaint/Subjective: Tolerating clear liquids.  Still with more diarrhea than baseline.  Took sleeping pill last night and woke up lying in stool.  Objective: Vital signs in last 24 hours: Temp:  [98 F (36.7 C)-98.9 F (37.2 C)] 98.9 F (37.2 C) (06/10 0439) Pulse Rate:  [91-99] 98 (06/10 0439) Resp:  [16-18] 16 (06/10 0439) BP: (109-136)/(42-53) 109/42 (06/10 0439) SpO2:  [96 %-98 %] 96 % (06/10 0439) Weight:  [60.3 kg (132 lb 15 oz)] 60.3 kg (132 lb 15 oz) (06/10 0439) Last BM Date: 11/27/16  Intake/Output from previous day: 06/09 0701 - 06/10 0700 In: 2640 [P.O.:840; I.V.:1800] Out: 400 [Urine:400] Intake/Output this shift: Total I/O In: -  Out: 200 [Urine:200]  PE: General- In NAD.  Awake and alert. Abdomen-soft, mild epigastric tenderness, active bowel sounds  Lab Results:   Recent Labs  11/26/16 0455 11/27/16 0506  WBC 7.2 7.9  HGB 11.5* 10.4*  HCT 34.5* 31.4*  PLT 272 280   BMET  Recent Labs  11/26/16 0455 11/27/16 0506  NA 140 141  K 4.1 3.7  CL 106 111  CO2 27 24  GLUCOSE 100* 84  BUN 12 8  CREATININE 0.81 0.70  CALCIUM 7.9* 7.8*   PT/INR No results for input(s): LABPROT, INR in the last 72 hours. Comprehensive Metabolic Panel:    Component Value Date/Time   NA 141 11/27/2016 0506   NA 140 11/26/2016 0455   K 3.7 11/27/2016 0506   K 4.1 11/26/2016 0455   CL 111 11/27/2016 0506   CL 106 11/26/2016 0455   CO2 24 11/27/2016 0506   CO2 27 11/26/2016 0455   BUN 8 11/27/2016 0506   BUN 12 11/26/2016 0455   CREATININE 0.70 11/27/2016 0506   CREATININE 0.81 11/26/2016 0455   CREATININE  0.74 01/13/2016 1440   GLUCOSE 84 11/27/2016 0506   GLUCOSE 100 (H) 11/26/2016 0455   CALCIUM 7.8 (L) 11/27/2016 0506   CALCIUM 7.9 (L) 11/26/2016 0455   AST 14 (L) 11/26/2016 0455   AST 35 11/23/2016 1536   ALT 8 (L) 11/26/2016 0455   ALT 10 (L) 11/23/2016 1536   ALKPHOS 50 11/26/2016 0455   ALKPHOS 54 11/23/2016 1536   BILITOT 0.7 11/26/2016 0455   BILITOT 1.2 11/23/2016 1536   PROT 5.1 (L) 11/26/2016 0455   PROT 6.0 (L) 11/23/2016 1536   ALBUMIN 2.8 (L) 11/26/2016 0455   ALBUMIN 3.7 11/23/2016 1536     Studies/Results: Ct Abdomen Pelvis W Contrast  Result Date: 11/25/2016 CLINICAL DATA:  Abdominal bloating and diarrhea for 2 days. EXAM: CT ABDOMEN AND PELVIS WITH CONTRAST TECHNIQUE: Multidetector CT imaging of the abdomen and pelvis was performed using the standard protocol following bolus administration of intravenous contrast. CONTRAST:  80 ml ISOVUE-300 IOPAMIDOL (ISOVUE-300) INJECTION 61% COMPARISON:  CT abdomen and pelvis 08/05/2007. FINDINGS: Lower chest: Heart size is normal. No pericardial effusion. Trace amount of pleural fluid on the right is noted. No left effusion. Minimal dependent atelectasis is seen. Hepatobiliary: 2 small hepatic cysts are identified. The liver is otherwise unremarkable. The gallbladder has been removed. Biliary tree  appears normal. Pancreas: Unremarkable. No pancreatic ductal dilatation or surrounding inflammatory changes. Spleen: Normal in size without focal abnormality. Adrenals/Urinary Tract: Adrenal glands are unremarkable. Kidneys are normal, without renal calculi, focal lesion, or hydronephrosis. Bladder is unremarkable. Stomach/Bowel: Small bowel loops are dilated up to 4 cm with air-fluid levels present. The terminal ileum is completely decompressed. Transition point appears to be in the right lower quadrant of the abdomen in the distal ileum where loops appear adhesed together. There is no pneumatosis, portal venous gas or free intraperitoneal air.  No fluid collection is identified. There is some gas and stool in an otherwise unremarkable colon. The patient is status post Nissen fundoplication. The appendix has been removed. Vascular/Lymphatic: No significant vascular findings are present. No enlarged abdominal or pelvic lymph nodes. Reproductive: Status post hysterectomy. No adnexal masses. Other: Very small fat containing umbilical hernia is seen. Musculoskeletal: No acute or focal bony abnormality. Lower lumbar degenerative change noted. IMPRESSION: The study is positive for small bowel obstruction which is likely due to adhesions. Transition point appears to be in the right lower quadrant in the distal ileum. No CT evidence of bowel ischemia. Atherosclerosis. Status post cholecystectomy and hysterectomy. Electronically Signed   By: Inge Rise M.D.   On: 11/25/2016 17:04   Dg Abd 2 Views  Result Date: 11/26/2016 CLINICAL DATA:  Abdominal distention. EXAM: ABDOMEN - 2 VIEW COMPARISON:  11/25/2016 CT and prior exams FINDINGS: Dilated gas and fluid-filled small bowel loops again noted. There is gas within the colon and rectum. No evidence of pneumoperitoneum. No suspicious calcifications are identified. IMPRESSION: Dilated small bowel loops again identified compatible with small bowel obstruction. No evidence of pneumoperitoneum. Electronically Signed   By: Margarette Canada M.D.   On: 11/26/2016 13:00    Anti-infectives: Anti-infectives    None       [place] J 11/27/2016 '

## 2016-11-27 NOTE — Evaluation (Signed)
Physical Therapy Evaluation Patient Details Name: Michelle Keith MRN: [id number] DOB: Nov 30, 1931 Today's Date: 11/27/2016   History of Present Illness  Pt is 83 yo female with HTN and hx of esophageal stricture, presented to [facility] ED with main concern of several days duration of poor oral intake, intermittent abd spasms, non blood diarrhea, nausea and weakness. Pt reports progressively worsening abd bloating that she initially noted in April this year and it is getting progressively worse.  likely viral ileitis, ? Enteritis.   Clinical Impression  The patient  Is ambulating well. Reports feeling weak. Should progress to DC to home. Pt admitted with above diagnosis. Pt currently with functional limitations due to the deficits listed below (see PT Problem List).  Pt will benefit from skilled PT to increase their independence and safety with mobility to allow discharge to the venue listed below.       Follow Up Recommendations  none    Equipment Recommendations    none   Recommendations for Other Services       Precautions / Restrictions Precautions Precautions: None      Mobility  Bed Mobility Overal bed mobility: Needs Assistance Bed Mobility: Supine to Sit;Sit to Supine     Supine to sit: Min assist Sit to supine: Supervision   General bed mobility comments: assist to pull up to sitting  Transfers Overall transfer level: Needs assistance Equipment used: None Transfers: Sit to/from Stand Sit to Stand: Min guard            Ambulation/Gait Ambulation/Gait assistance: Min guard Ambulation Distance (Feet): 90 Feet Assistive device: None Gait Pattern/deviations: Step-through pattern     General Gait Details: gait is steady but slow  Science writer    Modified Rankin (Stroke Patients Only)       Balance Overall balance assessment: Independent                                           Pertinent Vitals/Pain Pain  Assessment: Faces Faces Pain Scale: Hurts little more Pain Location: abdoment Pain Descriptors / Indicators: Aching;Tightness    Home Living Family/patient expects to be discharged to:: Private residence Living Arrangements: Alone   Type of Home: Apartment Home Access: Elevator     Home Layout: One level Home Equipment: None      Prior Function Level of Independence: Independent         Comments: spouse in SNF     Hand Dominance        Extremity/Trunk Assessment   Upper Extremity Assessment Upper Extremity Assessment: Generalized weakness    Lower Extremity Assessment Lower Extremity Assessment: Generalized weakness    Cervical / Trunk Assessment Cervical / Trunk Assessment: Normal  Communication   Communication: No difficulties  Cognition Arousal/Alertness: Awake/alert Behavior During Therapy: WFL for tasks assessed/performed Overall Cognitive Status: Within Functional Limits for tasks assessed                                        General Comments      Exercises     Assessment/Plan    PT Assessment Patient needs continued PT services  PT Problem List Decreased mobility;Decreased activity tolerance       PT Treatment Interventions  Gait training;Functional mobility training    PT Goals (Current goals can be found in the Care Plan section)  Acute Rehab PT Goals Patient Stated Goal: to go home so I can see my husband PT Goal Formulation: With patient Time For Goal Achievement: 12/04/16 Potential to Achieve Goals: Good    Frequency Min 3X/week   Barriers to discharge        Co-evaluation               AM-PAC PT "6 Clicks" Daily Activity  Outcome Measure Difficulty turning over in bed (including adjusting bedclothes, sheets and blankets)?: A Little Difficulty moving from lying on back to sitting on the side of the bed? : None Difficulty sitting down on and standing up from a chair with arms (e.g., wheelchair,  bedside commode, etc,.)?: A Little Help needed moving to and from a bed to chair (including a wheelchair)?: A Little Help needed walking in hospital room?: A Little Help needed climbing 3-5 steps with a railing? : A Little 6 Click Score: 19    End of Session   Activity Tolerance: Patient tolerated treatment well Patient left: in bed;with call bell/phone within reach Nurse Communication: Mobility status PT Visit Diagnosis: Muscle weakness (generalized) (M62.81)    Time: 3354-5625 PT Time Calculation (min) (ACUTE ONLY): 11 min   Charges:   PT Evaluation $PT Eval Low Complexity: 1 Procedure     PT G CodesTresa Keith PT [phone]   Michelle Keith 11/27/2016, 12:46 PM

## 2016-11-28 MED ORDER — PANTOPRAZOLE SODIUM 40 MG PO TBEC
40.0000 mg | DELAYED_RELEASE_TABLET | Freq: Every day | ORAL | Status: DC
  Administered 2016-11-28 – 2016-11-29 (×2): 40 mg via ORAL
  Filled 2016-11-28 (×3): qty 1

## 2016-11-28 NOTE — Progress Notes (Signed)
Received call from Infection Prevention, enteric precautions may be d/c'd

## 2016-11-28 NOTE — Progress Notes (Signed)
CC:  Abdominal pain/syncope  Subjective: Feeling better, she has had diarrhea since Hiatal hernia repair back around 2006  Objective: Vital signs in last 24 hours: Temp:  [98.1 F (36.7 C)-98.5 F (36.9 C)] 98.1 F (36.7 C) (06/11 0648) Pulse Rate:  [97-99] 97 (06/11 0648) Resp:  [17] 17 (06/11 0648) BP: (143-149)/(60-61) 143/61 (06/11 0648) SpO2:  [96 %] 96 % (06/11 0648) Last BM Date: 11/27/16   240 po 750 iv 1300 URINE bm X 4 RECORDED  Second shift yesteray Afebrile, VSS Multiple species on urine C diff is negative No labs today 2 View yesterday:  Mildly prominent loops of small bowel in the central abdomen, favoring adynamic small bowel ileus.    Intake/Output from previous day: 06/10 0701 - 06/11 0700 In: 990 [P.O.:240; I.V.:750] Out: 1300 [Urine:1300] Intake/Output this shift: Total I/O In: -  Out: 700 [Urine:700]  General appearance: alert, cooperative and no distress GI: soft, tender all over.  Well healed surgical scars from Nissen, + BS, hyperactive.  Lab Results:   Recent Labs  11/26/16 0455 11/27/16 0506  WBC 7.2 7.9  HGB 11.5* 10.4*  HCT 34.5* 31.4*  PLT 272 280    BMET  Recent Labs  11/26/16 0455 11/27/16 0506  NA 140 141  K 4.1 3.7  CL 106 111  CO2 27 24  GLUCOSE 100* 84  BUN 12 8  CREATININE 0.81 0.70  CALCIUM 7.9* 7.8*   PT/INR No results for input(s): LABPROT, INR in the last 72 hours.   Recent Labs Lab 11/23/16 1536 11/26/16 0455  AST 35 14*  ALT 10* 8*  ALKPHOS 54 50  BILITOT 1.2 0.7  PROT 6.0* 5.1*  ALBUMIN 3.7 2.8*     Lipase  No results found for: LIPASE   Prior to Admission medications   Medication Sig Start Date End Date Taking? Authorizing Provider  cholecalciferol (VITAMIN D) 1000 UNITS tablet Take 1,000 Units by mouth daily.    Yes [provider]  meclizine (ANTIVERT) 25 MG tablet Take 25 mg by mouth 2 (two) times daily as needed for dizziness or nausea.   Yes [provider]  metoprolol tartrate (LOPRESSOR) 25 MG tablet Take 0.5 tablets (12.5 mg total) by mouth daily. 09/27/16  Yes Minus Breeding, MD  mirtazapine (REMERON) 15 MG tablet Take 3.75 mg by mouth at bedtime.    Yes [provider]  Multiple Vitamins-Minerals (ICAPS AREDS 2 PO) Take 1 capsule by mouth 2 (two) times daily.   Yes [provider]  pantoprazole (PROTONIX) 40 MG tablet Take 40 mg by mouth daily as needed (heartburn).    Yes [provider]  Probiotic Product (PROBIOTIC DAILY PO) Take 1 capsule by mouth daily.    Yes [provider]  raloxifene (EVISTA) 60 MG tablet Take 60 mg by mouth daily.   Yes [provider]  ranitidine (ZANTAC) 150 MG tablet Take 150 mg by mouth 2 (two) times daily as needed for heartburn.   Yes [provider]  zolpidem (AMBIEN) 10 MG tablet Take 2.5 mg by mouth daily as needed for sleep.    Yes [provider]    Medications: . cholecalciferol  1,000 Units Oral Daily  . enoxaparin (LOVENOX) injection  40 mg Subcutaneous Q24H  . metoprolol tartrate  12.5 mg Oral Daily  . mirtazapine  3.75 mg Oral QHS  . pantoprazole  40 mg Oral Daily  . raloxifene  60 mg Oral Daily   . sodium chloride  Stopped (11/27/16 1600)   Anti-infectives    None      Assessment/Plan Chronic Diarrhea with PSBO vs ileus Large type 3 mixed hiatal hernia.; Laparoscopic takedown of intrathoracic stomach.,Repair of diaphragm with pledgets. [employer] fundoplication over a #28 bougie. 06/22/04, Dr. [name] SIRS with ileus Hyponatremia Tachycardia Hypertension Hx of esophageal stricture Hx of GERD QFD:VOUZHQU diet at 68 Pm today - IV fluids stopped ID:  No Abx Dvt: Lovenox  Plan:  She has had her diet advanced, she says she feels better.  I will follow and see how she does.  LOS: 3 days    Glena Pharris 11/28/2016 [phone]

## 2016-11-28 NOTE — Progress Notes (Signed)
Physical Therapy Treatment Patient Details Name: Michelle Keith MRN: [id number] DOB: 08-Aug-1931 Today's Date: 11/28/2016    History of Present Illness Pt is 83 yo female with HTN and hx of esophageal stricture, presented to [facility] ED with main concern of several days duration of poor oral intake, intermittent abd spasms, non blood diarrhea, nausea and weakness. Pt reports progressively worsening abd bloating that she initially noted in April this year and it is getting progressively worse.  likely viral ileitis, ? Enteritis.     PT Comments    Progressing with mobility. Encouraged pt to ambulate more, as tolerated.    Follow Up Recommendations  No PT follow up     Equipment Recommendations  None recommended by PT    Recommendations for Other Services       Precautions / Restrictions Precautions Precautions: None Restrictions Weight Bearing Restrictions: No    Mobility  Bed Mobility Overal bed mobility: Needs Assistance Bed Mobility: Supine to Sit;Sit to Supine     Supine to sit: Supervision Sit to supine: Supervision      Transfers Overall transfer level: Needs assistance   Transfers: Sit to/from Stand Sit to Stand: Supervision            Ambulation/Gait Ambulation/Gait assistance: Min guard Ambulation Distance (Feet): 500 Feet Assistive device: None Gait Pattern/deviations: Step-through pattern;Decreased stride length;Staggering left;Staggering right;Drifts right/left     General Gait Details: mildly unsteady at times.    Stairs            Wheelchair Mobility    Modified Rankin (Stroke Patients Only)       Balance                                            Cognition Arousal/Alertness: Awake/alert Behavior During Therapy: WFL for tasks assessed/performed Overall Cognitive Status: Within Functional Limits for tasks assessed                                        Exercises      General Comments        Pertinent Vitals/Pain Pain Assessment: No/denies pain    Home Living                      Prior Function            PT Goals (current goals can now be found in the care plan section) Progress towards PT goals: Progressing toward goals    Frequency    Min 3X/week      PT Plan Current plan remains appropriate    Co-evaluation              AM-PAC PT "6 Clicks" Daily Activity  Outcome Measure  Difficulty turning over in bed (including adjusting bedclothes, sheets and blankets)?: A Little Difficulty moving from lying on back to sitting on the side of the bed? : A Little Difficulty sitting down on and standing up from a chair with arms (e.g., wheelchair, bedside commode, etc,.)?: A Little Help needed moving to and from a bed to chair (including a wheelchair)?: A Little Help needed walking in hospital room?: A Little Help needed climbing 3-5 steps with a railing? : A Little 6 Click Score: 18    End of Session Equipment  Utilized During Treatment: Gait belt Activity Tolerance: Patient tolerated treatment well Patient left: in bed;with call bell/phone within reach   PT Visit Diagnosis: Muscle weakness (generalized) (M62.81);Difficulty in walking, not elsewhere classified (R26.2)     Time: 4403-4742 PT Time Calculation (min) (ACUTE ONLY): 13 min  Charges:  $Gait Training: 8-22 mins                    G Codes:      Weston Anna, MPT Pager: [phone]     Weston Anna, MPT Pager: [phone]

## 2016-11-28 NOTE — Progress Notes (Signed)
The patient is receiving Protonix by the intravenous route.  Based on criteria approved by the Pharmacy and [place], the medication is being converted to the equivalent oral dose form.  These criteria include: -No active GI bleeding -Able to tolerate diet of full liquids (or better) or tube feeding -Able to tolerate other medications by the oral or enteral route  If you have any questions about this conversion, please contact the Pharmacy Department (phone [phone]).  Thank you.  Minda Ditto PharmD Pager [phone] 11/28/2016, 11:05 AM

## 2016-11-28 NOTE — Progress Notes (Signed)
Patient ID: TALINE NASS, female   DOB: May 28, 1932, 83 y.o.   MRN: [id number]   PROGRESS NOTE    LORRINDA RAMSTAD  IOX:[id number] DOB: Aug 13, 1931 DOA: 11/25/2016  PCP: [name], MD   Brief Narrative:  Pt is 83 yo female with HTN and hx of esophageal stricture, presented to [facility] ED with main concern of several days duration of poor oral intake, intermittent abd spasms, non blood diarrhea, nausea and weakness. Pt reports progressively worsening abd bloating that she initially noted in April this year and it is getting progressively worse. Imagine studies in ED, worrisome for potential SBO but after discussion with surgery team, likely viral ileitis, ? Enteritis. TRH asked to admit for further evaluation.   Assessment & Plan:   Active Problems:   SIRS, Ileus ([place]), diarrhea, nausea  - Unclear etiology, question viral, ileus, partial small bowel obstruction - Patient explains she continues to feel better but still with diarrhea even though less frequent - Per surgery team, no surgical intervention indicated at this time - Gastroenterology team also consulted and recommended to continue supportive care as we're doing currently - Diet has been advanced in patients over tolerating well - Abdomen still slightly distended - Continue to monitor and follow-up on GI team recommendations    Hyponatremia - resolved with IVF - BMP In AM    Tachycardia - reactive, resoled     HTN, essential - stable for now   DVT prophylaxis: Lovenox SQ Code Status: Full  Family Communication: patient at bedside, family over the phone  Disposition Plan: home in 1-2 days  Consultants:   Surgery  GI  Procedures:   None  Antimicrobials:   None  Subjective: Patient reports feeling better, still with abdominal bloating  Objective: Vitals:   11/27/16 0439 11/27/16 1403 11/27/16 2118 11/28/16 0648  BP: (!) 109/42 (!) 151/70 (!) 149/60 (!) 143/61  Pulse: 98 100 99 97  Resp: 16 16 17 17   Temp:  98.9 F (37.2 C)  98.5 F (36.9 C) 98.1 F (36.7 C)  TempSrc: Oral  Oral Oral  SpO2: 96% 98% 96% 96%  Weight: 60.3 kg (132 lb 15 oz)     Height:        Intake/Output Summary (Last 24 hours) at 11/28/16 1159 Last data filed at 11/28/16 1000  Gross per 24 hour  Intake              450 ml  Output             1400 ml  Net             -950 ml   Filed Weights   11/25/16 1348 11/27/16 0439  Weight: 57.2 kg (126 lb) 60.3 kg (132 lb 15 oz)   Physical Exam  Constitutional: Appears well-developed and well-nourished. No distress.  CVS: RRR, S1/S2 +, no murmurs, no gallops, no carotid bruit.  Pulmonary: Effort and breath sounds normal, no stridor, rhonchi, wheezes, rales.  Abdominal: Soft. BS +,  still slightly distended but nontender, no guarding  Musculoskeletal: Normal range of motion. No edema and no tenderness.  Lymphadenopathy: No lymphadenopathy noted, cervical, inguinal.  Data Reviewed: I have personally reviewed following labs and imaging studies  CBC:  Recent Labs Lab 11/23/16 1536 11/25/16 1415 11/26/16 0455 11/27/16 0506  WBC 16.4* 3.7* 7.2 7.9  NEUTROABS 14.2*  --   --   --   HGB 12.7 13.2 11.5* 10.4*  HCT 38.1 38.3 34.5* 31.4*  MCV 90.5 90.1  91.3 92.1  PLT 287 298 272 725   Basic Metabolic Panel:  Recent Labs Lab 11/23/16 1536 11/25/16 1415 11/26/16 0455 11/27/16 0506  NA 135 132* 140 141  K 5.0 3.9 4.1 3.7  CL 103 100* 106 111  CO2 23 22 27 24   GLUCOSE 144* 135* 100* 84  BUN 16 13 12 8   CREATININE 0.87 0.81 0.81 0.70  CALCIUM 8.0* 8.2* 7.9* 7.8*   Liver Function Tests:  Recent Labs Lab 11/23/16 1536 11/26/16 0455  AST 35 14*  ALT 10* 8*  ALKPHOS 54 50  BILITOT 1.2 0.7  PROT 6.0* 5.1*  ALBUMIN 3.7 2.8*   CBG:  Recent Labs Lab 11/23/16 1524 11/25/16 1420  GLUCAP 128* 133*   Urine analysis:    Component Value Date/Time   COLORURINE STRAW (A) 11/25/2016 2025   APPEARANCEUR CLEAR 11/25/2016 2025   LABSPEC 1.034 (H) 11/25/2016 2025     PHURINE 6.0 11/25/2016 2025   GLUCOSEU NEGATIVE 11/25/2016 2025   HGBUR NEGATIVE 11/25/2016 2025   BILIRUBINUR NEGATIVE 11/25/2016 2025   KETONESUR 5 (A) 11/25/2016 2025   PROTEINUR NEGATIVE 11/25/2016 2025   UROBILINOGEN 0.2 08/04/2007 2230   NITRITE NEGATIVE 11/25/2016 2025   LEUKOCYTESUR NEGATIVE 11/25/2016 2025   Radiology Studies: Dg Abd 2 Views  Result Date: 11/27/2016 CLINICAL DATA:  Upper abdominal pain, nausea, ileus EXAM: ABDOMEN - 2 VIEW COMPARISON:  11/26/2016 FINDINGS: Mildly prominent loops of small bowel in the central abdomen. Colon is not decompressed. Air to the rectum. Appearance remains compatible with adynamic small bowel ileus favored over small bowel obstruction. No evidence of free air on the lateral decubitus view. Degenerative changes of the lumbar spine. IMPRESSION: Mildly prominent loops of small bowel in the central abdomen, favoring adynamic small bowel ileus. No free air. Electronically Signed   By: Julian Hy M.D.   On: 11/27/2016 10:21   Dg Abd 2 Views  Result Date: 11/26/2016 CLINICAL DATA:  Abdominal distention. EXAM: ABDOMEN - 2 VIEW COMPARISON:  11/25/2016 CT and prior exams FINDINGS: Dilated gas and fluid-filled small bowel loops again noted. There is gas within the colon and rectum. No evidence of pneumoperitoneum. No suspicious calcifications are identified. IMPRESSION: Dilated small bowel loops again identified compatible with small bowel obstruction. No evidence of pneumoperitoneum. Electronically Signed   By: Margarette Canada M.D.   On: 11/26/2016 13:00   Scheduled Meds: . cholecalciferol  1,000 Units Oral Daily  . enoxaparin (LOVENOX) injection  40 mg Subcutaneous Q24H  . metoprolol tartrate  12.5 mg Oral Daily  . mirtazapine  3.75 mg Oral QHS  . pantoprazole  40 mg Oral Daily  . raloxifene  60 mg Oral Daily   Continuous Infusions: . sodium chloride Stopped (11/27/16 1600)    LOS: 3 days   Time spent: 25 minute  Faye Ramsay,  MD [facility] Pager [phone]  If 7PM-7AM, please contact night-coverage [email or web address] Password [email or web address] 11/28/2016, 11:59 AM

## 2016-11-29 ENCOUNTER — Inpatient Hospital Stay (HOSPITAL_COMMUNITY): Payer: Medicare HMO

## 2016-11-29 MED ORDER — SODIUM CHLORIDE 0.9 % IV SOLN: INTRAVENOUS | Status: AC

## 2016-11-29 NOTE — Progress Notes (Signed)
[facility]/Trauma Progress Note      Subjective:  CC: diarrhea  Pt states she was feeling better yesterday than today although her abdominal pain is improved. She is tolerating her diet. She is hoping to go home tomorrow. She denies fever, chills, vomiting.   Objective: Vital signs in last 24 hours: Temp:  [98.3 F (36.8 C)-98.6 F (37 C)] 98.6 F (37 C) (06/12 0515) Pulse Rate:  [85-95] 85 (06/12 0515) Resp:  [16] 16 (06/12 0515) BP: (130-143)/(69-70) 143/69 (06/12 0515) SpO2:  [93 %-97 %] 93 % (06/12 0515) Last BM Date: 11/28/16  Intake/Output from previous day: 06/11 0701 - 06/12 0700 In: 1330 [P.O.:1200; I.V.:130] Out: 2100 [Urine:2100] Intake/Output this shift: No intake/output data recorded.  PE: Gen:  Alert, NAD, pleasant, cooperative, well appearing Card:  RRR, no M/G/R heard Pulm:  Rate and effort normal Abd: Soft, NT/ND, +BS, no hernias appreciated Skin: no rashes noted, warm and dry  Lab Results:   Recent Labs  11/27/16 0506  WBC 7.9  HGB 10.4*  HCT 31.4*  PLT 280   BMET  Recent Labs  11/27/16 0506  NA 141  K 3.7  CL 111  CO2 24  GLUCOSE 84  BUN 8  CREATININE 0.70  CALCIUM 7.8*   PT/INR No results for input(s): LABPROT, INR in the last 72 hours. CMP     Component Value Date/Time   NA 141 11/27/2016 0506   K 3.7 11/27/2016 0506   CL 111 11/27/2016 0506   CO2 24 11/27/2016 0506   GLUCOSE 84 11/27/2016 0506   BUN 8 11/27/2016 0506   CREATININE 0.70 11/27/2016 0506   CREATININE 0.74 01/13/2016 1440   CALCIUM 7.8 (L) 11/27/2016 0506   PROT 5.1 (L) 11/26/2016 0455   ALBUMIN 2.8 (L) 11/26/2016 0455   AST 14 (L) 11/26/2016 0455   ALT 8 (L) 11/26/2016 0455   ALKPHOS 50 11/26/2016 0455   BILITOT 0.7 11/26/2016 0455   GFRNONAA >60 11/27/2016 0506   GFRAA >60 11/27/2016 0506   Lipase  No results found for: LIPASE  Studies/Results: Dg Abd 2 Views  Result Date: 11/27/2016 CLINICAL DATA:  Upper abdominal pain, nausea,  ileus EXAM: ABDOMEN - 2 VIEW COMPARISON:  11/26/2016 FINDINGS: Mildly prominent loops of small bowel in the central abdomen. Colon is not decompressed. Air to the rectum. Appearance remains compatible with adynamic small bowel ileus favored over small bowel obstruction. No evidence of free air on the lateral decubitus view. Degenerative changes of the lumbar spine. IMPRESSION: Mildly prominent loops of small bowel in the central abdomen, favoring adynamic small bowel ileus. No free air. Electronically Signed   By: Julian Hy M.D.   On: 11/27/2016 10:21    Anti-infectives: Anti-infectives    None       Assessment/Plan Hx of esophageal stricture Hx of GERD Large type 3 mixed hiatal hernia.; Laparoscopic takedown of intrathoracic stomach.,Repair of diaphragm with pledgets.Nissan fundoplication over a #39 bougie. 06/22/04, Dr. [name] SIRS with ileus Hyponatremia Tachycardia Hypertension  Chronic Diarrhea with PSBO vs ileus - pt having diarrhea, no vomiting and tolerating diet  [id number] diet  ID:  No Abx Dvt: Lovenox  Plan: Pt appears to be improving and is tolerating diet. Hopefully home soon. We will continue to follow.     LOS: 4 days    Kalman Drape , [facility] 11/29/2016, 8:21 AM Pager: [phone] Consults: [phone] Mon-Fri 7:00 am-4:30 pm Sat-Sun 7:00 am-11:30 am

## 2016-11-29 NOTE — Progress Notes (Signed)
[facility] Gastroenterology Progress Note  Michelle Keith 83 y.o. 24-May-1932  CC:  Diarrhea, ileus   Subjective: Patient's diarrhea is improving. She is tolerating regular diet. Denied nausea vomiting. Abdominal pain improving.  ROS : Negative for chest pain and shortness of breath   Objective: Vital signs in last 24 hours: Vitals:   11/28/16 2051 11/29/16 0515  BP: 130/70 (!) 143/69  Pulse: 95 85  Resp: 16 16  Temp: 98.3 F (36.8 C) 98.6 F (37 C)    Physical Exam:  General:  Alert, cooperative, no distress, appears stated age  Head:  Normocephalic, without obvious abnormality, atraumatic  Eyes:  , EOM's intact,   Lungs:   Clear to auscultation bilaterally, respirations unlabored  Heart:  Regular rate and rhythm, S1, S2 normal  Abdomen:   Soft, Mild right upper and right lower quadrant discomfort on palpation, no peritoneal signs, bowel sounds active all four quadrants,  no masses,   Extremities: Extremities normal, atraumatic, no  edema  Pulses: 2+ and symmetric    Lab Results:  Recent Labs  11/27/16 0506  NA 141  K 3.7  CL 111  CO2 24  GLUCOSE 84  BUN 8  CREATININE 0.70  CALCIUM 7.8*   No results for input(s): AST, ALT, ALKPHOS, BILITOT, PROT, ALBUMIN in the last 72 hours.  Recent Labs  11/27/16 0506  WBC 7.9  HGB 10.4*  HCT 31.4*  MCV 92.1  PLT 280   No results for input(s): LABPROT, INR in the last 72 hours.    Assessment/Plan: - Acute on chronic diarrhea.  [name] negative. GI pathogen panel negative. Most likely IBS versus bile salt diarrhea - Partial small bowel obstruction versus ileus. Improving.  - Abdominal bloating, abdominal cramps and on and off diarrhea. Possible underlying irritable bowel syndrome. - History of Nissen fundoplication - Anemia. No overt bleeding.  Recommendations ------------------------ - Patient's diarrhea is improving. She is tolerating regular diet. - Offered Colestid and Bentyl for abdominal cramp and  diarrhea. Patient declined trying any new medications at this time. She would like to take Imodium as needed which she has been doing for a long time. - No further inpatient GI workup planned. - GI will sign off. Follow-up with Dr. [name]  in 3-4 weeks after discharge.   Otis Brace MD, [place] 11/29/2016, 10:46 AM  Pager [phone]  If no answer or after 5 PM call [phone]

## 2016-11-29 NOTE — Care Management Important Message (Signed)
Important Message  Patient Details  Name: Michelle Keith MRN: [id number] Date of Birth: [birth year]   Medicare Important Message Given:  Yes    Kerin Salen 11/29/2016, 11:39 AMImportant Message  Patient Details  Name: Michelle Keith MRN: [id number] Date of Birth: 02/06/1932   Medicare Important Message Given:  Yes    Kerin Salen 11/29/2016, 11:39 AM

## 2016-11-29 NOTE — Progress Notes (Signed)
Patient ID: Michelle Keith, female   DOB: 06-16-1932, 83 y.o.   MRN: [id number]   PROGRESS NOTE    Michelle Keith  QJJ:[id number] DOB: Aug 29, 1931 DOA: 11/25/2016  PCP: [name], MD   Brief Narrative:  Pt is 83 yo female with HTN and hx of esophageal stricture, presented to [facility] ED with main concern of several days duration of poor oral intake, intermittent abd spasms, non blood diarrhea, nausea and weakness. Pt reports progressively worsening abd bloating that she initially noted in April this year and it is getting progressively worse. Imagine studies in ED, worrisome for potential SBO but after discussion with surgery team, likely viral ileitis, ? Enteritis. TRH asked to admit for further evaluation.   Assessment & Plan:   Active Problems:   SIRS, Ileus ([place]), diarrhea, nausea  - Question viral etiology versus ileus, partial small obstruction - Patient continues to improve, less frequent diarrheas in the past 24 hours - Per gastroenterology team recommendation is to try Colestid and Bentyl for abdominal cramps,  patient however wants to continue Imodium as needed only  - No further GI workup needed  - Per surgery team no further surgical workup needed  - Diet advance, so far tolerating well but says she still feels bloated  - We'll repeat abdominal x-ray today, if patient continues to improve, can be discharged home tomorrow     Hyponatremia - Resolved with IV fluids, repeat BMP in the morning     Tachycardia - reactive and resolved     HTN, essential - stable for now   DVT prophylaxis: Lovenox SQ Code Status: Full  Family Communication:  discussed with patient at bedside, family updated over the phone  Disposition Plan: home in morning if patient reports feeling better and tolerating diet   Consultants:   Surgery  GI  Procedures:   None  Antimicrobials:   None  Subjective: Patient reports feeling better, still with diarrhea even though less frequent, still  with abdominal bloating   Objective: Vitals:   11/28/16 0648 11/28/16 2051 11/28/16 2103 11/29/16 0515  BP: (!) 143/61 130/70  (!) 143/69  Pulse: 97 95  85  Resp: 17 16  16   Temp: 98.1 F (36.7 C) 98.3 F (36.8 C)  98.6 F (37 C)  TempSrc: Oral Oral Oral Oral  SpO2: 96% 97%  93%  Weight:      Height:        Intake/Output Summary (Last 24 hours) at 11/29/16 1346 Last data filed at 11/29/16 1239  Gross per 24 hour  Intake              970 ml  Output             1700 ml  Net             -730 ml   Filed Weights   11/25/16 1348 11/27/16 0439  Weight: 57.2 kg (126 lb) 60.3 kg (132 lb 15 oz)   Physical Exam  Constitutional: Appears well-developed and well-nourished. No distress.  CVS: RRR, S1/S2 +, no murmurs, no gallops, no carotid bruit.  Pulmonary: Effort and breath sounds normal, no stridor, rhonchi, wheezes, rales.  Abdominal: Soft. BS +,  Slightly distended with no guarding, mild tenderness in epigastric area  Musculoskeletal: Normal range of motion. No edema and no tenderness.  Lymphadenopathy: No lymphadenopathy noted, cervical, inguinal. Neuro: Alert. Normal reflexes, muscle tone coordination. No cranial nerve deficit. Skin: Skin is warm and dry. No rash noted. Not diaphoretic.  No erythema. No pallor.  Psychiatric: Normal mood and affect. Behavior, judgment, thought content normal.   Data Reviewed: I have personally reviewed following labs and imaging studies  CBC:  Recent Labs Lab 11/23/16 1536 11/25/16 1415 11/26/16 0455 11/27/16 0506  WBC 16.4* 3.7* 7.2 7.9  NEUTROABS 14.2*  --   --   --   HGB 12.7 13.2 11.5* 10.4*  HCT 38.1 38.3 34.5* 31.4*  MCV 90.5 90.1 91.3 92.1  PLT 287 298 272 662   Basic Metabolic Panel:  Recent Labs Lab 11/23/16 1536 11/25/16 1415 11/26/16 0455 11/27/16 0506  NA 135 132* 140 141  K 5.0 3.9 4.1 3.7  CL 103 100* 106 111  CO2 23 22 27 24   GLUCOSE 144* 135* 100* 84  BUN 16 13 12 8   CREATININE 0.87 0.81 0.81 0.70    CALCIUM 8.0* 8.2* 7.9* 7.8*   Liver Function Tests:  Recent Labs Lab 11/23/16 1536 11/26/16 0455  AST 35 14*  ALT 10* 8*  ALKPHOS 54 50  BILITOT 1.2 0.7  PROT 6.0* 5.1*  ALBUMIN 3.7 2.8*   CBG:  Recent Labs Lab 11/23/16 1524 11/25/16 1420  GLUCAP 128* 133*   Urine analysis:    Component Value Date/Time   COLORURINE STRAW (A) 11/25/2016 2025   APPEARANCEUR CLEAR 11/25/2016 2025   LABSPEC 1.034 (H) 11/25/2016 2025   PHURINE 6.0 11/25/2016 2025   GLUCOSEU NEGATIVE 11/25/2016 2025   HGBUR NEGATIVE 11/25/2016 2025   BILIRUBINUR NEGATIVE 11/25/2016 2025   KETONESUR 5 (A) 11/25/2016 2025   PROTEINUR NEGATIVE 11/25/2016 2025   UROBILINOGEN 0.2 08/04/2007 2230   NITRITE NEGATIVE 11/25/2016 2025   LEUKOCYTESUR NEGATIVE 11/25/2016 2025   Radiology Studies: No results found. Scheduled Meds: . cholecalciferol  1,000 Units Oral Daily  . enoxaparin (LOVENOX) injection  40 mg Subcutaneous Q24H  . metoprolol tartrate  12.5 mg Oral Daily  . mirtazapine  3.75 mg Oral QHS  . pantoprazole  40 mg Oral Daily  . raloxifene  60 mg Oral Daily   Continuous Infusions: . sodium chloride 1,000 mL (11/29/16 0416)    LOS: 4 days   Time spent: 25 minutes   Faye Ramsay, MD [facility] Pager [phone]  If 7PM-7AM, please contact night-coverage [email or web address] Password [email or web address] 11/29/2016, 1:46 PM

## 2016-11-30 DIAGNOSIS — R140 Abdominal distension (gaseous): Secondary | ICD-10-CM

## 2016-11-30 DIAGNOSIS — K567 Ileus, unspecified: Principal | ICD-10-CM

## 2016-11-30 MED ORDER — PANCRELIPASE (LIP-PROT-AMYL) 12000-38000 UNITS PO CPEP: 12000.0000 [IU] | ORAL_CAPSULE | Freq: Three times a day (TID) | ORAL | 0 refills | Status: DC

## 2016-11-30 MED ORDER — HYDROCORTISONE 0.5 % EX CREA
TOPICAL_CREAM | Freq: Two times a day (BID) | CUTANEOUS | Status: DC
  Filled 2016-11-30: qty 28.35

## 2016-11-30 MED ORDER — PANCRELIPASE (LIP-PROT-AMYL) 12000-38000 UNITS PO CPEP
12000.0000 [IU] | ORAL_CAPSULE | Freq: Three times a day (TID) | ORAL | Status: DC
  Filled 2016-11-30: qty 1

## 2016-11-30 MED ORDER — HYDROCORTISONE 0.5 % EX CREA: TOPICAL_CREAM | Freq: Two times a day (BID) | CUTANEOUS | 0 refills | Status: DC

## 2016-11-30 NOTE — Progress Notes (Signed)
[facility]/Tra[facility] Progress Note      Subjective:  CC: abdominal bloating  Pt had 4 BM's yesterday 2 of which were normal and 2 were very loose. She is tolerating her diet. She is complaining of abdominal bloating. She states her abdomen bloats every day after eating and usually goes down overnight. She states this morning she is a little more bloated than normal after eating breatkfast. No abdominal pain or vomiting.   Objective: Vital signs in last 24 hours: Temp:  [98.1 F (36.7 C)-99.1 F (37.3 C)] 98.4 F (36.9 C) (06/13 0601) Pulse Rate:  [87-94] 87 (06/13 0601) Resp:  [15-16] 16 (06/13 0601) BP: (133-146)/(50-69) 146/69 (06/13 0601) SpO2:  [93 %-95 %] 94 % (06/13 0601) Last BM Date: 11/29/16  Intake/Output from previous day: 06/12 0701 - 06/13 0700 In: 840 [P.O.:840] Out: 1200 [Urine:1200] Intake/Output this shift: No intake/output data recorded.  PE: Gen:  Alert, NAD, pleasant, cooperative, well appearing Pulm:  Rate and effort normal Abd: Soft, not distended, +BS, no hernias appreciated, no TTP Skin: no rashes noted, warm and dry  Lab Results:  No results for input(s): WBC, HGB, HCT, PLT in the last 72 hours. BMET No results for input(s): NA, K, CL, CO2, GLUCOSE, BUN, CREATININE, CALCIUM in the last 72 hours. PT/INR No results for input(s): LABPROT, INR in the last 72 hours. CMP     Component Value Date/Time   NA 141 11/27/2016 0506   K 3.7 11/27/2016 0506   CL 111 11/27/2016 0506   CO2 24 11/27/2016 0506   GLUCOSE 84 11/27/2016 0506   BUN 8 11/27/2016 0506   CREATININE 0.70 11/27/2016 0506   CREATININE 0.74 01/13/2016 1440   CALCIUM 7.8 (L) 11/27/2016 0506   PROT 5.1 (L) 11/26/2016 0455   ALBUMIN 2.8 (L) 11/26/2016 0455   AST 14 (L) 11/26/2016 0455   ALT 8 (L) 11/26/2016 0455   ALKPHOS 50 11/26/2016 0455   BILITOT 0.7 11/26/2016 0455   GFRNONAA >60 11/27/2016 0506   GFRAA >60 11/27/2016 0506   Lipase  No results found for:  LIPASE  Studies/Results: Dg Abd 2 Views  Result Date: 11/29/2016 CLINICAL DATA:  Poor oral intake.  Abdominal distention EXAM: ABDOMEN - 2 VIEW COMPARISON:  11/27/2016 FINDINGS: Ongoing distention of small bowel and stomach, but improved from prior and especially from abdominal CT 11/25/2016. The colon contains stool and gas, but is less dilated than the colon. No concerning mass effect or gas collection. Atelectatic type opacities at the bases with possible trace pleural fluid. IMPRESSION: 1. Slowly improving small bowel dilatation. Ileus or partial small bowel obstruction could have this appearance. 2. No evidence of pneumoperitoneum. Electronically Signed   By: Monte Fantasia M.D.   On: 11/29/2016 15:42    Anti-infectives: Anti-infectives    None       Assessment/Plan Hx of esophageal stricture Hx of GERD Large type 3 mixed hiatal hernia.; Laparoscopic takedown of intrathoracic stomach.,Repair of diaphragm with pledgets.Nissan fundoplication over a #76 bougie. 06/22/04, Dr. [name] SIRS with ileus Hyponatremia Tachycardia Hypertension  Chronic Diarrhea with PSBO vs ileus - pt having diarrhea, no vomiting and tolerating diet  LYY:[id number] diet  ID: No Abx Dvt: Lovenox  Plan: Pt appears to be improving and is tolerating diet. Clear for discharge from a surgical standpoint.    LOS: 5 days    Kalman Drape , [facility] 11/30/2016, 7:22 AM Pager: [phone] Consults: [phone] Mon-Fri 7:00 am-4:30 pm Sat-Sun 7:00 am-11:30 am  Chronic  diarrhea is not related to vagotomy.  I am suspecting chronic pancreatic insufficiency and want to try her on Creon now and as an outpatient.  She wanted to go ahead and start trial now rather than waiting to see Dr.  Joylene Draft.    Kaylyn Lim, MD, FACS

## 2016-11-30 NOTE — Progress Notes (Signed)
PT Discharge Note  Patient Details Name: Michelle Keith MRN: [id number] DOB: 02/25/1932   Cancelled Treatment:  Re-eval unnecessary. Observed pt ambulating in hallways independently. Spoke with pt who reported she walked 6 laps this morning and that she just finished another 6 laps. Will sign off and d/c pt from PT. Thanks.    Weston Anna, MPT Pager: [phone]

## 2016-11-30 NOTE — Care Management Note (Signed)
Case Management Note  Patient Details  Name: Michelle Keith MRN: [id number] Date of Birth: 04/08/1932  Subjective/Objective:  [facility] chosen for [employer] instruction, disease mgmnt. Rep Kim aware of d/c & HHRN order.                  Action/Plan:dj/c home w/[facility].   Expected Discharge Date:  11/30/16               Expected Discharge Plan:  [place]  In-House Referral:     Discharge planning Services  CM Consult  Post Acute Care Choice:    Choice offered to:  Patient  DME Arranged:    DME Agency:     HH Arranged:  RN [place] Agency:     Status of Service:  Completed, signed off  If discussed at [employer] of Stay Meetings, dates discussed:    Additional Comments:  Dessa Phi, RN 11/30/2016, 3:45 PM

## 2016-11-30 NOTE — Care Management Note (Signed)
Case Management Note  Patient Details  Name: GEAN LAURSEN MRN: [id number] Date of Birth: 1931-08-06  Subjective/Objective: 83 y/o f admitted w/ileus. From home, alone. Will await PT re-eval. If HHRN needed for meds instruction,assessment can arrange with order. Patient declines [facility] info for transportation-states she already is aware of this service but doesn't find it helpful-she will arrange own transportation. Await choice for [facility] if ordered.Noted rash on patient's back-Nurse has notified attending.                    Action/Plan:d/c plan home.   Expected Discharge Date:   (unknown)               Expected Discharge Plan:  [place]  In-House Referral:     Discharge planning Services  CM Consult  Post Acute Care Choice:    Choice offered to:  Patient  DME Arranged:    DME Agency:     HH Arranged:    [place] Agency:     Status of Service:  In process, will continue to follow  If discussed at Long Length of Stay Meetings, dates discussed:    Additional Comments:  Dessa Phi, RN 11/30/2016, 11:10 AM

## 2016-11-30 NOTE — Discharge Summary (Signed)
Discharge Summary  Michelle Keith:[id number] DOB: June 18, 1932  PCP: [name], MD  Admit date: 11/25/2016 Discharge date: 11/30/2016  Time spent: <73mins  Recommendations for Outpatient Follow-up:  1. F/u with PMD within a week  for hospital discharge follow up, repeat cbc/bmp at follow up, consider stool fat testing for chronic diarrhea  Discharge Diagnoses:  Active Hospital Problems   Diagnosis Date Noted  . Ileus ([place]) 11/25/2016    Resolved Hospital Problems   Diagnosis Date Noted Date Resolved  No resolved problems to display.    Discharge Condition: stable  Diet recommendation: heart healthy  Filed Weights   11/25/16 1348 11/27/16 0439  Weight: 57.2 kg (126 lb) 60.3 kg (132 lb 15 oz)    History of present illness:  PCP: [name], MD   Patient coming from: home  Chief Complaint: abd pain   HPI: Michelle Keith is a 83 y.o. female with known HTN, esophageal stricture, presents with main concern of several days duration of abd cramping, mostly in the epigastric area, intermittent in nature and sharp, non radiating, worse with oral intake solids or liquids, associated with non blood diarrhea, abd bloating, nausea and poor oral intake, weakness. Pt denies any specific alleviating factors, no known sick contact or exposures, no similar events in the past. No fevers, chills, no chest pain or dyspnea, no urinary concerns.   ED Course: Pt is clinically stable, VS notable for T 97.34F, HR up to 108, RR 18, BP 144/71. Blood work notable for WBC 3.7, Na 132, lactic acid 2.18. Imaging studies notable for ? SBO. ED doctor spoke with surgery on call, considered less likely SBP rather possible ileus vs viral etiology.   Hospital Course:  Active Problems:   Ileus (H[facility])  SIRS, Ileus ([place]), acute on chronic diarrhea (report started to have diarrhea after hiatal hernia surgery more than 68yrs ago)  - Question viral etiology versus ileus, partial small obstruction, stool gi  pathogen pcr negative, c diff negative. - Per gastroenterology team recommendation is to try Colestid and Bentyl for abdominal cramps,  patient however wants to continue Imodium as needed only ,  No further GI workup needed , Gi signed off - Per surgery team no further surgical workup needed , patient is offered pancreatic enzyme supplement, she is reluctant to take it, she will talk to her primary care physician at hospital follow up - repeat abdominal x-ray on 6/12 with improvement - Patient continues to improve, less frequent diarrheas in the past 24 hour, now on regular diet, she is discharged home   Hyponatremia. Mild Sodium 132 on 6/8, sodium normalized    Tachycardia - reactive and resolved   HTN, essential - stable on home meds lopressor  H/o recurrent syncope: has extensive cardiac work up, she is followed with cardiology prn Suspect syncope episode could also from dehydration, she is encouraged to have adequate oral intake.  She is screened by physical therapy who donot think she will need PT, she is arranged to have home health RN for disease management. Medication supervision.  DVT prophylaxis: Lovenox SQ Code Status: Full  Family Communication:  discussed with patient at bedside Disposition Plan: home   Consultants:   Surgery  GI  Procedures:   None  Antimicrobials:   None   Discharge Exam: BP (!) 146/69 (BP Location: Left Arm)   Pulse 87   Temp 98.4 F (36.9 C) (Oral)   Resp 16   Ht 5\' 5"  (1.651 m)   Wt 60.3  kg (132 lb 15 oz)   SpO2 94%   BMI 22.12 kg/m   General: NAD, ambulating Cardiovascular: RRR Respiratory: CTABL Skin: left shoulder skin lesion, reported followed by dermatology, few scattered folicullitis on central upper back, mildly itch  Discharge Instructions You were cared for by a hospitalist during your hospital stay. If you have any questions about your discharge medications or the care you received while you were in the  hospital after you are discharged, you can call the unit and asked to speak with the hospitalist on call if the hospitalist that took care of you is not available. Once you are discharged, your primary care physician will handle any further medical issues. Please note that NO REFILLS for any discharge medications will be authorized once you are discharged, as it is imperative that you return to your primary care physician (or establish a relationship with a primary care physician if you do not have one) for your aftercare needs so that they can reassess your need for medications and monitor your lab values.  Discharge Instructions    Diet - low sodium heart healthy    Complete by:  As directed    Face-to-face encounter (required for Medicare/Medicaid patients)    Complete by:  As directed    I Elmer Merwin certify that this patient is under my care and that I, or a nurse practitioner or physician's assistant working with me, had a face-to-face encounter that meets the physician face-to-face encounter requirements with this patient on 11/30/2016. The encounter with the patient was in whole, or in part for the following medical condition(s) which is the primary reason for home health care (List medical condition): FTT, disease management and medication supervision   The encounter with the patient was in whole, or in part, for the following medical condition, which is the primary reason for home health care:  FTT   I certify that, based on my findings, the following services are medically necessary home health services:  Nursing   Reason for Medically Necessary Home Health Services:  Skilled Nursing- Change/Decline in Patient Status   My clinical findings support the need for the above services:  OTHER SEE COMMENTS   Further, I certify that my clinical findings support that this patient is homebound due to:  Shortness of Breath with activity   Home Health    Complete by:  As directed    To provide the  following care/treatments:  RN   Increase activity slowly    Complete by:  As directed      Allergies as of 11/30/2016      Reactions   Aspirin    Atenolol    Clarithromycin    Epinephrine    Lidocaine    Omeprazole Magnesium Other (See Comments)   Ask at office visit   Penicillins    Pindolol Diarrhea   Fatigue, hip pain   Sulfonamide Derivatives    Tetracycline    Glycol Stearate Palpitations   Valsartan Palpitations      Medication List    TAKE these medications   cholecalciferol 1000 units tablet Commonly known as:  VITAMIN D Take 1,000 Units by mouth daily.   hydrocortisone cream 0.5 % Apply topically 2 (two) times daily.   ICAPS AREDS 2 PO Take 1 capsule by mouth 2 (two) times daily.   lipase/protease/amylase 12000 units Cpep capsule Commonly known as:  CREON Take 1 capsule (12,000 Units total) by mouth 3 (three) times daily before meals.   meclizine 25  MG tablet Commonly known as:  ANTIVERT Take 25 mg by mouth 2 (two) times daily as needed for dizziness or nausea.   metoprolol tartrate 25 MG tablet Commonly known as:  LOPRESSOR Take 0.5 tablets (12.5 mg total) by mouth daily.   mirtazapine 15 MG tablet Commonly known as:  REMERON Take 3.75 mg by mouth at bedtime.   pantoprazole 40 MG tablet Commonly known as:  PROTONIX Take 40 mg by mouth daily as needed (heartburn).   PROBIOTIC DAILY PO Take 1 capsule by mouth daily.   raloxifene 60 MG tablet Commonly known as:  EVISTA Take 60 mg by mouth daily.   ranitidine 150 MG tablet Commonly known as:  ZANTAC Take 150 mg by mouth 2 (two) times daily as needed for heartburn.   zolpidem 10 MG tablet Commonly known as:  AMBIEN Take 2.5 mg by mouth daily as needed for sleep.      Allergies  Allergen Reactions  . Aspirin   . Atenolol   . Clarithromycin   . Epinephrine   . Lidocaine   . Omeprazole Magnesium Other (See Comments)    Ask at office visit  . Penicillins   . Pindolol Diarrhea     Fatigue, hip pain  . Sulfonamide Derivatives   . Tetracycline   . Glycol Stearate Palpitations  . Valsartan Palpitations   Follow-up Information    [facility] up in 3 week(s).   Why:  for bloating /chronic diarrhea Contact information: [place] [address] [phone]        [name], MD Follow up in 1 week(s).   Specialty:  Internal Medicine Why:  hospital discharge follow up, repeat cbc/bmp at follow up. Contact information: [address] [place] [address] [phone]            The results of significant diagnostics from this hospitalization (including imaging, microbiology, ancillary and laboratory) are listed below for reference.    Significant Diagnostic Studies: Dg Chest 2 View  Result Date: 11/23/2016 CLINICAL DATA:  Two syncopal episodes during which the patient hit the head on wall. History of previous episodes of syncope. Nonsmoker. EXAM: CHEST  2 VIEW COMPARISON:  Chest x-ray of August 22, 2007 FINDINGS: The lungs are well-expanded and clear. The heart and pulmonary vascularity are normal. The mediastinum is normal in width. There is no pleural effusion. The bony thorax exhibits no acute abnormality. There are degenerative changes of both shoulders. There are surgical clips to the level of the GE junction. IMPRESSION: There is no pneumonia, CHF, nor other acute cardiopulmonary abnormality. Electronically Signed   By: David  Martinique M.D.   On: 11/23/2016 16:51   Ct Abdomen Pelvis W Contrast  Result Date: 11/25/2016 CLINICAL DATA:  Abdominal bloating and diarrhea for 2 days. EXAM: CT ABDOMEN AND PELVIS WITH CONTRAST TECHNIQUE: Multidetector CT imaging of the abdomen and pelvis was performed using the standard protocol following bolus administration of intravenous contrast. CONTRAST:  80 ml ISOVUE-300 IOPAMIDOL (ISOVUE-300) INJECTION 61% COMPARISON:  CT abdomen and pelvis 08/05/2007. FINDINGS: Lower chest: Heart  size is normal. No pericardial effusion. Trace amount of pleural fluid on the right is noted. No left effusion. Minimal dependent atelectasis is seen. Hepatobiliary: 2 small hepatic cysts are identified. The liver is otherwise unremarkable. The gallbladder has been removed. Biliary tree appears normal. Pancreas: Unremarkable. No pancreatic ductal dilatation or surrounding inflammatory changes. Spleen: Normal in size without focal abnormality. Adrenals/Urinary Tract: Adrenal glands are unremarkable. Kidneys are normal,  without renal calculi, focal lesion, or hydronephrosis. Bladder is unremarkable. Stomach/Bowel: Small bowel loops are dilated up to 4 cm with air-fluid levels present. The terminal ileum is completely decompressed. Transition point appears to be in the right lower quadrant of the abdomen in the distal ileum where loops appear adhesed together. There is no pneumatosis, portal venous gas or free intraperitoneal air. No fluid collection is identified. There is some gas and stool in an otherwise unremarkable colon. The patient is status post Nissen fundoplication. The appendix has been removed. Vascular/Lymphatic: No significant vascular findings are present. No enlarged abdominal or pelvic lymph nodes. Reproductive: Status post hysterectomy. No adnexal masses. Other: Very small fat containing umbilical hernia is seen. Musculoskeletal: No acute or focal bony abnormality. Lower lumbar degenerative change noted. IMPRESSION: The study is positive for small bowel obstruction which is likely due to adhesions. Transition point appears to be in the right lower quadrant in the distal ileum. No CT evidence of bowel ischemia. Atherosclerosis. Status post cholecystectomy and hysterectomy. Electronically Signed   By: Inge Rise M.D.   On: 11/25/2016 17:04   Dg Abd 2 Views  Result Date: 11/29/2016 CLINICAL DATA:  Poor oral intake.  Abdominal distention EXAM: ABDOMEN - 2 VIEW COMPARISON:  11/27/2016 FINDINGS:  Ongoing distention of small bowel and stomach, but improved from prior and especially from abdominal CT 11/25/2016. The colon contains stool and gas, but is less dilated than the colon. No concerning mass effect or gas collection. Atelectatic type opacities at the bases with possible trace pleural fluid. IMPRESSION: 1. Slowly improving small bowel dilatation. Ileus or partial small bowel obstruction could have this appearance. 2. No evidence of pneumoperitoneum. Electronically Signed   By: Monte Fantasia M.D.   On: 11/29/2016 15:42   Dg Abd 2 Views  Result Date: 11/27/2016 CLINICAL DATA:  Upper abdominal pain, nausea, ileus EXAM: ABDOMEN - 2 VIEW COMPARISON:  11/26/2016 FINDINGS: Mildly prominent loops of small bowel in the central abdomen. Colon is not decompressed. Air to the rectum. Appearance remains compatible with adynamic small bowel ileus favored over small bowel obstruction. No evidence of free air on the lateral decubitus view. Degenerative changes of the lumbar spine. IMPRESSION: Mildly prominent loops of small bowel in the central abdomen, favoring adynamic small bowel ileus. No free air. Electronically Signed   By: Julian Hy M.D.   On: 11/27/2016 10:21   Dg Abd 2 Views  Result Date: 11/26/2016 CLINICAL DATA:  Abdominal distention. EXAM: ABDOMEN - 2 VIEW COMPARISON:  11/25/2016 CT and prior exams FINDINGS: Dilated gas and fluid-filled small bowel loops again noted. There is gas within the colon and rectum. No evidence of pneumoperitoneum. No suspicious calcifications are identified. IMPRESSION: Dilated small bowel loops again identified compatible with small bowel obstruction. No evidence of pneumoperitoneum. Electronically Signed   By: Margarette Canada M.D.   On: 11/26/2016 13:00    Microbiology: Recent Results (from the past 240 hour(s))  Urine culture     Status: Abnormal   Collection Time: 11/25/16  8:25 PM  Result Value Ref Range Status   Specimen Description URINE, RANDOM  Final     Special Requests NONE  Final   Culture MULTIPLE SPECIES PRESENT, SUGGEST RECOLLECTION (A)  Final   Report Status 11/27/2016 FINAL  Final  C difficile quick scan w PCR reflex     Status: None   Collection Time: 11/26/16 10:48 AM  Result Value Ref Range Status   C Diff antigen NEGATIVE NEGATIVE Final   C  Diff toxin NEGATIVE NEGATIVE Final   C Diff interpretation No [name] detected.  Final  Gastrointestinal Panel by PCR , Stool     Status: None   Collection Time: 11/26/16 10:48 AM  Result Value Ref Range Status   Campylobacter species NOT DETECTED NOT DETECTED Final   Plesimonas shigelloides NOT DETECTED NOT DETECTED Final   Salmonella species NOT DETECTED NOT DETECTED Final   Yersinia enterocolitica NOT DETECTED NOT DETECTED Final   Vibrio species NOT DETECTED NOT DETECTED Final   Vibrio cholerae NOT DETECTED NOT DETECTED Final   Enteroaggregative E coli (EAEC) NOT DETECTED NOT DETECTED Final   Enteropathogenic E coli (EPEC) NOT DETECTED NOT DETECTED Final   Enterotoxigenic E coli (ETEC) NOT DETECTED NOT DETECTED Final   Shiga like toxin producing E coli (STEC) NOT DETECTED NOT DETECTED Final   Shigella/Enteroinvasive E coli (EIEC) NOT DETECTED NOT DETECTED Final   Cryptosporidium NOT DETECTED NOT DETECTED Final   Cyclospora cayetanensis NOT DETECTED NOT DETECTED Final   Entamoeba histolytica NOT DETECTED NOT DETECTED Final   Giardia lamblia NOT DETECTED NOT DETECTED Final   Adenovirus F40/41 NOT DETECTED NOT DETECTED Final   Astrovirus NOT DETECTED NOT DETECTED Final   Norovirus GI/GII NOT DETECTED NOT DETECTED Final   Rotavirus A NOT DETECTED NOT DETECTED Final   Sapovirus (I, II, IV, and V) NOT DETECTED NOT DETECTED Final     Labs: Basic Metabolic Panel:  Recent Labs Lab 11/23/16 1536 11/25/16 1415 11/26/16 0455 11/27/16 0506  NA 135 132* 140 141  K 5.0 3.9 4.1 3.7  CL 103 100* 106 111  CO2 23 22 27 24   GLUCOSE 144* 135* 100* 84  BUN 16 13 12 8   CREATININE  0.87 0.81 0.81 0.70  CALCIUM 8.0* 8.2* 7.9* 7.8*   Liver Function Tests:  Recent Labs Lab 11/23/16 1536 11/26/16 0455  AST 35 14*  ALT 10* 8*  ALKPHOS 54 50  BILITOT 1.2 0.7  PROT 6.0* 5.1*  ALBUMIN 3.7 2.8*   No results for input(s): LIPASE, AMYLASE in the last 168 hours. No results for input(s): AMMONIA in the last 168 hours. CBC:  Recent Labs Lab 11/23/16 1536 11/25/16 1415 11/26/16 0455 11/27/16 0506  WBC 16.4* 3.7* 7.2 7.9  NEUTROABS 14.2*  --   --   --   HGB 12.7 13.2 11.5* 10.4*  HCT 38.1 38.3 34.5* 31.4*  MCV 90.5 90.1 91.3 92.1  PLT 287 298 272 280   Cardiac Enzymes: No results for input(s): CKTOTAL, CKMB, CKMBINDEX, TROPONINI in the last 168 hours. BNP: BNP (last 3 results) No results for input(s): BNP in the last 8760 hours.  ProBNP (last 3 results) No results for input(s): PROBNP in the last 8760 hours.  CBG:  Recent Labs Lab 11/23/16 1524 11/25/16 1420  GLUCAP 128* 133*       Signed:  Benjaman Artman MD, PhD  [facility] 11/30/2016, 3:19 PM

## 2016-12-06 DIAGNOSIS — I10 Essential (primary) hypertension: Secondary | ICD-10-CM | POA: Diagnosis not present

## 2016-12-06 DIAGNOSIS — R55 Syncope and collapse: Secondary | ICD-10-CM | POA: Diagnosis not present

## 2016-12-06 DIAGNOSIS — K222 Esophageal obstruction: Secondary | ICD-10-CM | POA: Diagnosis not present

## 2016-12-06 DIAGNOSIS — K567 Ileus, unspecified: Secondary | ICD-10-CM | POA: Diagnosis not present

## 2016-12-08 ENCOUNTER — Encounter: Payer: Self-pay | Admitting: Cardiology

## 2016-12-08 DIAGNOSIS — Z6820 Body mass index (BMI) 20.0-20.9, adult: Secondary | ICD-10-CM | POA: Diagnosis not present

## 2016-12-08 DIAGNOSIS — K567 Ileus, unspecified: Secondary | ICD-10-CM | POA: Diagnosis not present

## 2016-12-08 DIAGNOSIS — I10 Essential (primary) hypertension: Secondary | ICD-10-CM | POA: Diagnosis not present

## 2016-12-08 DIAGNOSIS — M810 Age-related osteoporosis without current pathological fracture: Secondary | ICD-10-CM | POA: Diagnosis not present

## 2016-12-08 DIAGNOSIS — M542 Cervicalgia: Secondary | ICD-10-CM | POA: Diagnosis not present

## 2016-12-09 DIAGNOSIS — K222 Esophageal obstruction: Secondary | ICD-10-CM | POA: Diagnosis not present

## 2016-12-09 DIAGNOSIS — K567 Ileus, unspecified: Secondary | ICD-10-CM | POA: Diagnosis not present

## 2016-12-09 DIAGNOSIS — I10 Essential (primary) hypertension: Secondary | ICD-10-CM | POA: Diagnosis not present

## 2016-12-09 DIAGNOSIS — R55 Syncope and collapse: Secondary | ICD-10-CM | POA: Diagnosis not present

## 2016-12-13 ENCOUNTER — Other Ambulatory Visit (HOSPITAL_COMMUNITY): Payer: Self-pay | Admitting: Internal Medicine

## 2016-12-13 DIAGNOSIS — I6521 Occlusion and stenosis of right carotid artery: Secondary | ICD-10-CM

## 2016-12-14 ENCOUNTER — Ambulatory Visit (HOSPITAL_COMMUNITY)
Admission: RE | Admit: 2016-12-14 | Discharge: 2016-12-14 | Disposition: A | Discharge status: Home / Self Care | Payer: Medicare HMO | Source: Ambulatory Visit | Attending: Vascular Surgery | Admitting: Vascular Surgery

## 2016-12-14 DIAGNOSIS — I6521 Occlusion and stenosis of right carotid artery: Secondary | ICD-10-CM | POA: Diagnosis not present

## 2016-12-14 LAB — VAS US CAROTID
LCCADDIAS: 18 cm/s
LCCADSYS: 92 cm/s
LCCAPDIAS: 21 cm/s
LCCAPSYS: 106 cm/s
LEFT VERTEBRAL DIAS: 12 cm/s
LICADSYS: 88 cm/s
Left ICA dist dias: 27 cm/s
Left ICA prox dias: -21 cm/s
Left ICA prox sys: -78 cm/s
RCCADSYS: -97 cm/s
RCCAPDIAS: 6 cm/s
RCCAPSYS: 82 cm/s
RIGHT CCA MID DIAS: 18 cm/s
RIGHT ECA DIAS: 6 cm/s
RIGHT VERTEBRAL DIAS: -9 cm/s

## 2016-12-15 ENCOUNTER — Encounter: Payer: Self-pay | Admitting: Internal Medicine

## 2016-12-15 DIAGNOSIS — K222 Esophageal obstruction: Secondary | ICD-10-CM | POA: Diagnosis not present

## 2016-12-15 DIAGNOSIS — I10 Essential (primary) hypertension: Secondary | ICD-10-CM | POA: Diagnosis not present

## 2016-12-15 DIAGNOSIS — R55 Syncope and collapse: Secondary | ICD-10-CM | POA: Diagnosis not present

## 2016-12-15 DIAGNOSIS — K567 Ileus, unspecified: Secondary | ICD-10-CM | POA: Diagnosis not present

## 2016-12-20 DIAGNOSIS — R55 Syncope and collapse: Secondary | ICD-10-CM | POA: Diagnosis not present

## 2016-12-20 DIAGNOSIS — I10 Essential (primary) hypertension: Secondary | ICD-10-CM | POA: Diagnosis not present

## 2016-12-20 DIAGNOSIS — K567 Ileus, unspecified: Secondary | ICD-10-CM | POA: Diagnosis not present

## 2016-12-20 DIAGNOSIS — K222 Esophageal obstruction: Secondary | ICD-10-CM | POA: Diagnosis not present

## 2016-12-23 NOTE — Progress Notes (Signed)
Cardiology Office Note   Date:  12/26/2016   ID:  Michelle Keith, DOB February 08, 1932, MRN [id number]  PCP:  [name], MD Cardiologist:   Minus Breeding, MD   Chief Complaint  Patient presents with  . Palpitations     History of Present Illness: Michelle Keith is a 83 y.o. female who presents for evaluation of chest pain. She did have a history of syncope. She's had a workup that has included a negative stress perfusion study in 2011. Of note there is mention of a possible VSD in her primary care notes. However, I reviewed the echo results in 2007 and don't see mention of this. In 2016 she had a work up for chest pain with a negative Lexiscan Myoview. (Of note, she could not do a POET (Plain Old Exercise Treadmill).  She wore a Holter in July of last year.  There were no significant arrhythmias.  She was having palpitations.  She has not tolerated beta blockers and has been on calcium channel blockers.  However, she did not tolerate the calcium channel blocker.  She thought that her "heart would explode."     She returns for follow up.  At the last visit she was still having labile BPs.  Since I saw her she had an ileus which she is convinced is related to the beta blocker.  She is only taking 1/4 of a tablet at times now.  Her BP diary actually looks pretty good with rare BPs above target and no profound lows.  She still gets palpitations.  She is anxious and stressed  Past Medical History:  Diagnosis Date  . Esophageal stricture   . GERD (gastroesophageal reflux disease)   . Osteopenia   . Right thyroid nodule   . Squamous cell skin cancer   . Syncope     Past Surgical History:  Procedure Laterality Date  . Cataract extraction    . ESOPHAGEAL DILATION     x 4  . Exploratory laparoscopy    . NISSEN FUNDOPLICATION    . TOTAL ABDOMINAL HYSTERECTOMY       Current Outpatient Prescriptions  Medication Sig Dispense Refill  . cholecalciferol (VITAMIN D) 1000 UNITS tablet Take  1,000 Units by mouth daily.     . meclizine (ANTIVERT) 25 MG tablet Take 25 mg by mouth 2 (two) times daily as needed for dizziness or nausea.    . metoprolol tartrate (LOPRESSOR) 25 MG tablet Take 0.5 tablets (12.5 mg total) by mouth daily. 45 tablet 3  . mirtazapine (REMERON) 15 MG tablet Take 3.75 mg by mouth at bedtime.     . pantoprazole (PROTONIX) 40 MG tablet Take 40 mg by mouth daily as needed (heartburn).     . Probiotic Product (PROBIOTIC DAILY PO) Take 1 capsule by mouth daily.     . raloxifene (EVISTA) 60 MG tablet Take 60 mg by mouth daily.    Marland Kitchen zolpidem (AMBIEN) 10 MG tablet Take 2.5 mg by mouth daily as needed for sleep.      No current facility-administered medications for this visit.     Allergies:   Aspirin; Atenolol; Clarithromycin; Cortisone; Epinephrine; Lidocaine; Omeprazole magnesium; Penicillins; Pindolol; Pseudoephedrine-guaifenesin; Sulfonamide derivatives; Tetracycline; Glycol stearate; and Valsartan    ROS:  Please see the history of present illness.   Otherwise, review of systems are positive for none.   All other systems are reviewed and negative.    PHYSICAL EXAM: VS:  BP (!) 143/93   Pulse 93  Ht 5\' 5"  (1.651 m)   Wt 125 lb 9.6 oz (57 kg)   SpO2 99%   BMI 20.90 kg/m  , BMI Body mass index is 20.9 kg/m.  GENERAL:  Well appearing NECK:  No jugular venous distention, waveform within normal limits, carotid upstroke brisk and symmetric, no bruits, no thyromegaly LUNGS:  Clear to auscultation bilaterally BACK:  No CVA tenderness CHEST:  Unremarkable HEART:  PMI not displaced or sustained,S1 and S2 within normal limits, no S3, no S4, no clicks, no rubs, no murmurs ABD:  Flat, positive bowel sounds normal in frequency in pitch, no bruits, no rebound, no guarding, no midline pulsatile mass, no hepatomegaly, no splenomegaly EXT:  2 plus pulses throughout, no edema, no cyanosis no clubbing    EKG:  EKG is not not ordered today. NA  Recent  Labs: 01/13/2016: Magnesium 2.4; TSH 3.05 11/26/2016: ALT 8 11/27/2016: BUN 8; Creatinine, Ser 0.70; Hemoglobin 10.4; Platelets 280; Potassium 3.7; Sodium 141    Lipid Panel No results found for: CHOL, TRIG, HDL, CHOLHDL, VLDL, LDLCALC, LDLDIRECT    Wt Readings from Last 3 Encounters:  12/26/16 125 lb 9.6 oz (57 kg)  11/27/16 132 lb 15 oz (60.3 kg)  08/08/16 128 lb 9.6 oz (58.3 kg)    Lab Results  Component Value Date   TSH 3.05 01/13/2016     Other studies Reviewed: Additional studies/ records that were reviewed today include: BP diary Review of the above records demonstrates:  See elsewhere.     ASSESSMENT AND PLAN:   PALPITATIONS:    She is going to take PRN 6.25 metoprolol.  She does not tolerate higher dose meds.   HTN:  Her BP is  very labile.  This will be treated as above.   She needs to control her anxiety.     Current medicines are reviewed at length with the patient today.  The patient does not have concerns regarding medicines.  The following changes have been made:  As above  Labs/ tests ordered today include:      No orders of the defined types were placed in this encounter.    Disposition:   FU with me in 6 months.     Signed, Minus Breeding, MD  12/26/2016 9:56 AM    [facility]

## 2016-12-26 ENCOUNTER — Ambulatory Visit (INDEPENDENT_AMBULATORY_CARE_PROVIDER_SITE_OTHER): Payer: Medicare HMO | Admitting: Cardiology

## 2016-12-26 ENCOUNTER — Encounter: Payer: Self-pay | Admitting: Cardiology

## 2016-12-26 VITALS — BP 143/93 | HR 93 | Ht 65.0 in | Wt 125.6 lb

## 2016-12-26 DIAGNOSIS — I10 Essential (primary) hypertension: Secondary | ICD-10-CM | POA: Diagnosis not present

## 2016-12-26 DIAGNOSIS — R002 Palpitations: Secondary | ICD-10-CM

## 2016-12-26 NOTE — Patient Instructions (Signed)

## 2016-12-27 DIAGNOSIS — K222 Esophageal obstruction: Secondary | ICD-10-CM | POA: Diagnosis not present

## 2016-12-27 DIAGNOSIS — R55 Syncope and collapse: Secondary | ICD-10-CM | POA: Diagnosis not present

## 2016-12-27 DIAGNOSIS — I10 Essential (primary) hypertension: Secondary | ICD-10-CM | POA: Diagnosis not present

## 2016-12-27 DIAGNOSIS — K567 Ileus, unspecified: Secondary | ICD-10-CM | POA: Diagnosis not present

## 2017-01-17 DIAGNOSIS — Z6820 Body mass index (BMI) 20.0-20.9, adult: Secondary | ICD-10-CM | POA: Diagnosis not present

## 2017-01-17 DIAGNOSIS — M810 Age-related osteoporosis without current pathological fracture: Secondary | ICD-10-CM | POA: Diagnosis not present

## 2017-01-27 DIAGNOSIS — Z6820 Body mass index (BMI) 20.0-20.9, adult: Secondary | ICD-10-CM | POA: Diagnosis not present

## 2017-01-27 DIAGNOSIS — E559 Vitamin D deficiency, unspecified: Secondary | ICD-10-CM | POA: Diagnosis not present

## 2017-01-27 DIAGNOSIS — F418 Other specified anxiety disorders: Secondary | ICD-10-CM | POA: Diagnosis not present

## 2017-01-27 DIAGNOSIS — I10 Essential (primary) hypertension: Secondary | ICD-10-CM | POA: Diagnosis not present

## 2017-01-27 DIAGNOSIS — Z1389 Encounter for screening for other disorder: Secondary | ICD-10-CM | POA: Diagnosis not present

## 2017-01-27 DIAGNOSIS — M810 Age-related osteoporosis without current pathological fracture: Secondary | ICD-10-CM | POA: Diagnosis not present

## 2017-02-01 DIAGNOSIS — Z1231 Encounter for screening mammogram for malignant neoplasm of breast: Secondary | ICD-10-CM | POA: Diagnosis not present

## 2017-02-01 DIAGNOSIS — Z803 Family history of malignant neoplasm of breast: Secondary | ICD-10-CM | POA: Diagnosis not present

## 2017-02-13 ENCOUNTER — Encounter (INDEPENDENT_AMBULATORY_CARE_PROVIDER_SITE_OTHER): Payer: Self-pay

## 2017-02-13 ENCOUNTER — Ambulatory Visit (INDEPENDENT_AMBULATORY_CARE_PROVIDER_SITE_OTHER): Payer: Medicare HMO | Admitting: Internal Medicine

## 2017-02-13 ENCOUNTER — Encounter: Payer: Self-pay | Admitting: Internal Medicine

## 2017-02-13 VITALS — BP 128/68 | HR 74 | Ht 65.0 in | Wt 126.0 lb

## 2017-02-13 DIAGNOSIS — K580 Irritable bowel syndrome with diarrhea: Secondary | ICD-10-CM

## 2017-02-13 DIAGNOSIS — K567 Ileus, unspecified: Secondary | ICD-10-CM

## 2017-02-13 DIAGNOSIS — G909 Disorder of the autonomic nervous system, unspecified: Secondary | ICD-10-CM | POA: Diagnosis not present

## 2017-02-13 DIAGNOSIS — Z9889 Other specified postprocedural states: Secondary | ICD-10-CM | POA: Diagnosis not present

## 2017-02-13 DIAGNOSIS — G901 Familial dysautonomia [Riley-Day]: Secondary | ICD-10-CM

## 2017-02-13 MED ORDER — RIFAXIMIN 550 MG PO TABS: 550.0000 mg | ORAL_TABLET | Freq: Three times a day (TID) | ORAL | 0 refills | Status: DC

## 2017-02-13 NOTE — Patient Instructions (Addendum)
  We are giving you a course of xifaxan to take: Take one tablet three times a day for 14 days.   Follow up with Dr Carlean Purl in 2 months.   We will get your records from Dr Kalman Shan for review.    I appreciate the opportunity to care for you. Silvano Rusk, MD, [facility]

## 2017-02-13 NOTE — Progress Notes (Signed)
Michelle Keith 84 y.o. 1931-12-24 [id number] Referred by: [name], MD  Assessment & Plan:   Encounter Diagnoses  Name Primary?  . Irritable bowel syndrome with diarrhea Yes  . History of Nissen fundoplication   . Dysautonomia   . Ileus ([place])?, Resolved    The chart indicates her diarrhea problems began before her fundoplication though she's convinced it started after, it could've gotten worse. If she had a vagotomy at expect her to have more problems with vomiting and delayed gastric emptying it does not sound like that's an issue. She clearly has episodic severe diarrhea of and it frustrates her and in paracervical quality of life. She can do better with Imodium but she is troubled by trying to decide when she needs it. Significant quality of life impact with these diarrhea episodes.   He carries a diagnosis of dysautonomia made by Dr. [name] in the past. That were certainly may be the case. Again vagal nerve injury with fundoplication procedure is possible but her pattern of symptoms is different than what I'm used to seeing.  The hospitalization she had recently and in 2009 were severe with some dilated small bowel loops and could've been a partial obstruction could've been an infection and these are different than her typical more frequent chronic problems, which are not as severe, fortunately.  She does have a gallbladder, that's erroneous on the recent CT report and I will have that amended.  We have decided to try Xifaxan 550 mg 3 times a day for 14 days to see if that causes a long-term reduction or elimination of her episodic diarrhea thought to be due to IBS. She certainly has risk factors for small bowel bacterial overgrowth that include use of PPI.  Additional consideration could be trying something like ondansetron.  I will supply her with some recipes for oral hard duration solutions when she has severe diarrhea  She is to return in October and a contact me sooner  if needed   I appreciate the opportunity to care for this patient. CC: [name], MD Dr. [name] Subjective:   Chief Complaint:Diarrhea  HPI The patient is a delightful 83 year old white woman previously followed by Dr. [name], with chronic recurrent diarrhea after a fundoplication procedure. Daughter is here today and participates in the history She was recently hospitalized with severe diarrhea and ileus like pattern on x-ray. She had syncope related to that. While hospitalized but she was seen by [facility]. Supportive care. All infectious workup including pathogen panel and [name] negative. She is much better compared to that. However over the years she has had chronic recurrent episodes of diarrhea that she treats with Imodium. She has kept food diary, she's had a colonoscopy with random biopsies both before and after her fundoplication. She insisted the problems did not start until her fundoplication and believes Dr. [name] cut her vagus nerve. Though that's not in any op note she insists Dr. [name] told her he did that. She also after this admission in June believe she doesn't have a gallbladder and thinks Dr. [name] removed that because the CT scan showed no evidence of a gallbladder. However there is no cholecystectomy I note anywhere, and review of the images with the patient shows that she does have a gallbladder I think the radiologist must of thought that that was a dilated loop of bowel.  At this point she is improved though still somewhat weak. She says "I cannot overdo it".   Chronically there  is episodic diarrhea several times a month, sometimes severe to where she has to take to the bed a little bit. If this drops her quality of life and makes things unpredictable. There is slight urinary incontinence. Outside of this episode where she was hospitalized she generally does not have fecal incontinence.   She says it's harder for her to get to Dr. Osborn Coho  office and  she has a neighbor Loman Chroman who is my patient who recommended me.  Other than intermittent Imodium she has not tried any medication she's not been tested nor treated for small intestinal bacterial overgrowth. She has back on a PPI which she needs for heartburn control despite having a type III mixed hiatal hernia repair. She says that she is alternating that with some Zantac because she is concerned about the possibility of dementia related to PPI use. There is also a  a history of hoarseness and endoscopy bronchoscopy also was negative for that, and she says her son gets very concerned that she is hoarse and wants her to take her PPI. It sounds like that helps it some. She has had her vocal cords examined and there's been no pathology there. She also has some mild dysphagia complaints at times ever since her fundoplication.   Endoscopy procedure and GI operation summary  2002 colonoscopy with biopsy normal exam determine ileum and negative random colon biopsies note this is prior to her fundoplication in the indication was for diarrhea 2003 EGD patent ring and large hiatal hernia 2003 bronchoscopy Dr. Joya Gaskins I'll tracheobronchitis vocal cord dysfunction syndrome and mild malacia of trachea 2005 EGD large hiatal hernia 2005 esophageal manometry lower esophageal sphincter normal resting tone, normal per also this normal manometry 06/22/2004 was laparoscopic takedown of intrathoracic stomach and repair of diaphragm and fundoplication over a 22 French bougie Dr. [name] 2009 colonoscopy and random biopsies Dr. Oletta Lamas while hospitalized with diarrhea, x-ray suggested possible small bowel obstruction March 2009 laparoscopic lysis of adhesions Dr. [name] Allergies  Allergen Reactions  . Aspirin   . Atenolol   . Clarithromycin   . Cortisone Other (See Comments)  . Epinephrine   . Lidocaine   . Omeprazole Magnesium Other (See Comments)    Ask at office visit  . Penicillins   . Pindolol  Diarrhea    Fatigue, hip pain  . Pseudoephedrine-Guaifenesin Other (See Comments)  . Sulfonamide Derivatives   . Tetracycline   . Glycol Stearate Palpitations  . Valsartan Palpitations   Current Meds  Medication Sig  . cholecalciferol (VITAMIN D) 1000 UNITS tablet Take 1,000 Units by mouth daily.   . meclizine (ANTIVERT) 25 MG tablet Take 25 mg by mouth 2 (two) times daily as needed for dizziness or nausea.  . metoprolol tartrate (LOPRESSOR) 25 MG tablet Take 0.5 tablets (12.5 mg total) by mouth daily.  . mirtazapine (REMERON) 15 MG tablet Take 3.75 mg by mouth at bedtime.   . Multiple Vitamins-Minerals (PRESERVISION AREDS 2 PO) Take by mouth.  . pantoprazole (PROTONIX) 40 MG tablet Take 40 mg by mouth daily as needed (heartburn).   . Probiotic Product (PROBIOTIC DAILY PO) Take 1 capsule by mouth daily.   . raloxifene (EVISTA) 60 MG tablet Take 60 mg by mouth daily.  Marland Kitchen zolpidem (AMBIEN) 10 MG tablet Take 2.5 mg by mouth daily as needed for sleep.    Past Medical History:  Diagnosis Date  . Dysautonomia   . Esophageal stricture   . GERD (gastroesophageal reflux disease)   . Hiatal hernia   .  IBS (irritable bowel syndrome)   . Osteopenia   . Right thyroid nodule   . Squamous cell skin cancer   . Syncope    Past Surgical History:  Procedure Laterality Date  . Cataract extraction    . COLONOSCOPY  2009   Also 2002  . ESOPHAGEAL DILATION     x 4  . ESOPHAGEAL MANOMETRY    . ESOPHAGOGASTRODUODENOSCOPY  Multiple  . Exploratory laparoscopy    . NISSEN FUNDOPLICATION    . TOTAL ABDOMINAL HYSTERECTOMY     Social History   Social History  . Marital status: Married    Spouse name: N/A  . Number of children: 2  . Years of education: N/A   Occupational History  . Retired    Social History Main Topics  . Smoking status: Never Smoker  . Smokeless tobacco: Never Used  . Alcohol use No  . Drug use: No   Social History Narrative   Lives alone.  Husband in a nursing home  after a stroke.        1 son one daughter   family history includes Heart failure in her mother; Multiple myeloma in her father.   Review of Systems There is perhaps mild memory disturbance she has some mild weakness and fatigue. She follows with cardiology for palpitations and lightheadedness. She's had syncope on atenolol. There is a diagnoses of dysautonomia in her problem list. All other review of systems as per history of present illness or as above.  Objective:   Physical Exam _0  128/68   Pulse 74   Ht _1  (1.651 m)   Wt 126 lb (57.2 kg)   BMI 20.97 kg/m @  General:  Well-developed, well-nourished and in no acute distress Eyes:  anicteric. ENT:   Mouth and posterior pharynx free of lesions.  Neck:   supple w/o thyromegaly or mass.  Lungs: Clear to auscultation bilaterally. Heart:  S1S2, no rubs, murmurs, gallops. Abdomen:  soft, non-tender, no hepatosplenomegaly, hernia, or mass and BS+.  Lymph:  no cervical or supraclavicular adenopathy. Extremities:   no edema, cyanosis or clubbing Skin   no rash. Neuro:  A&O x 3.  Psych:  appropriate mood and  Affect.   Data Reviewed: Extensive data reviewed including Direct examination of CT and other x-ray images with the patient is carried out as reflected in the history of present illness.

## 2017-02-15 ENCOUNTER — Telehealth: Payer: Self-pay | Admitting: Internal Medicine

## 2017-02-15 NOTE — Telephone Encounter (Signed)
Patient notified that she is being treated for possible SIBO and I explained that this is an overgrowth of the normal flora of the intestine.  All questions answered and she will call back for any additional questions or concerns.

## 2017-02-16 ENCOUNTER — Telehealth: Payer: Self-pay | Admitting: Internal Medicine

## 2017-02-16 NOTE — Telephone Encounter (Signed)
All questions about bacterial overgrowth testing answered.  She has some concerns about taking Xifaxan and will think on it a little longer prior to staring medication. She will call back for additional questions or concerns.

## 2017-02-23 ENCOUNTER — Telehealth: Payer: Self-pay | Admitting: Internal Medicine

## 2017-02-23 NOTE — Telephone Encounter (Signed)
Please advise Sir, thank you. 

## 2017-02-23 NOTE — Telephone Encounter (Signed)
The appropriate dose is tid It stays in the intestine - does not get absorbed I recommend she take it tid to get full effect

## 2017-02-24 NOTE — Telephone Encounter (Signed)
Please advise Sir, thank you. 

## 2017-02-24 NOTE — Telephone Encounter (Signed)
Pt said she is returning your call Patient said she had to stop the antibiotic because it is causing SOB

## 2017-02-24 NOTE — Telephone Encounter (Signed)
Left patient detailed message with what Dr Carlean Purl recommends. Told her to call me back when she gets the message so I can be sure she got it.

## 2017-02-27 NOTE — Telephone Encounter (Signed)
Left her a message to call me back. 

## 2017-02-27 NOTE — Telephone Encounter (Signed)
It's her call but I find that very unlikely to be cause and affect.  She can try it twice a day if she wants

## 2017-02-27 NOTE — Telephone Encounter (Signed)
Spoke with Michelle Keith and she is much better today but too scared to try the xifaxan again because she is home alone.

## 2017-02-28 DIAGNOSIS — H6123 Impacted cerumen, bilateral: Secondary | ICD-10-CM | POA: Diagnosis not present

## 2017-04-19 ENCOUNTER — Ambulatory Visit: Payer: Medicare HMO | Admitting: Internal Medicine

## 2017-05-18 DIAGNOSIS — H353233 Exudative age-related macular degeneration, bilateral, with inactive scar: Secondary | ICD-10-CM | POA: Diagnosis not present

## 2017-05-18 DIAGNOSIS — H26492 Other secondary cataract, left eye: Secondary | ICD-10-CM | POA: Diagnosis not present

## 2017-05-18 DIAGNOSIS — H524 Presbyopia: Secondary | ICD-10-CM | POA: Diagnosis not present

## 2017-05-18 DIAGNOSIS — H02403 Unspecified ptosis of bilateral eyelids: Secondary | ICD-10-CM | POA: Diagnosis not present

## 2017-05-31 ENCOUNTER — Ambulatory Visit (INDEPENDENT_AMBULATORY_CARE_PROVIDER_SITE_OTHER): Payer: Medicare HMO | Admitting: Ophthalmology

## 2017-05-31 ENCOUNTER — Encounter (INDEPENDENT_AMBULATORY_CARE_PROVIDER_SITE_OTHER): Payer: Self-pay | Admitting: Ophthalmology

## 2017-05-31 DIAGNOSIS — H353132 Nonexudative age-related macular degeneration, bilateral, intermediate dry stage: Secondary | ICD-10-CM | POA: Diagnosis not present

## 2017-05-31 DIAGNOSIS — H35372 Puckering of macula, left eye: Secondary | ICD-10-CM | POA: Diagnosis not present

## 2017-05-31 DIAGNOSIS — Z961 Presence of intraocular lens: Secondary | ICD-10-CM | POA: Diagnosis not present

## 2017-05-31 NOTE — Progress Notes (Signed)
[facility] Note  05/31/2017     CHIEF COMPLAINT Patient presents for Retina Evaluation and Flashes/floaters   HISTORY OF PRESENT ILLNESS: Michelle Keith is a 83 y.o. female who presents to the clinic today for:   HPI    Retina Evaluation    In both eyes.  This started 15 years ago.  Associated Symptoms Flashes, Floaters and Photophobia.  Negative for Blind Spot, Glare, Shoulder/Hip pain, Fatigue, Jaw Claudication, Distortion, Redness, Scalp Tenderness, Weight Loss, Fever, Trauma and Pain.  Context:  distance vision, mid-range vision, near vision and night driving.  Treatments tried include surgery and eye drops.  Response to treatment was mild improvement.  I, the attending physician,  performed the HPI with the patient and updated documentation appropriately.          Flashes/floaters    In both eyes.  This started 15 years ago.  Duration Intermittant.  Characterized as small.  Since onset it is stable.  Associated Symptoms Flashes, Floaters and Photophobia.  Negative for Blind Spot, Glare, Shoulder/Hip pain, Fatigue, Jaw Claudication, Distortion, Redness, Scalp Tenderness, Weight Loss, Fever, Trauma and Pain.  Context:  distance vision, mid-range vision, near vision and night driving.  Treatments tried include eye drops and surgery.  Response to treatment was mild improvement.  I, the attending physician,  performed the HPI with the patient and updated documentation appropriately.          Comments    Referral of DR. Digby for evaluation of AMD. Patient states She had cataract sx 6 yrs ago and she continued to have flashes and floaters on and off. She occasionally has photo sensitivity OU. Denies pain. Pt uses Thera tears Bid . Pt takes Areds 2        Last edited by Bernarda Caffey, MD on 05/31/2017 10:47 AM. (History)      Referring physician: Calvert Cantor, MD [address] [place], [place] [address]  HISTORICAL INFORMATION:    Selected notes from the MEDICAL RECORD NUMBER Referred by Dr. Bing Plume for evaluation of AMD OU LEE- 11.29.18 (D. Digby) [BCVA OD: 20/25 OS: 20/25] Ocular Hx- pseudophakia OU ([place]), AMD OU, S/P bil bleph, ischemic optic neuropathy OS PMH- HTN    CURRENT MEDICATIONS: No current outpatient medications on file. (Ophthalmic Drugs)   No current facility-administered medications for this visit.  (Ophthalmic Drugs)   Current Outpatient Medications (Other)  Medication Sig  . cholecalciferol (VITAMIN D) 1000 UNITS tablet Take 1,000 Units by mouth daily.   . meclizine (ANTIVERT) 25 MG tablet Take 25 mg by mouth 2 (two) times daily as needed for dizziness or nausea.  . metoprolol tartrate (LOPRESSOR) 25 MG tablet Take 0.5 tablets (12.5 mg total) by mouth daily.  . mirtazapine (REMERON) 15 MG tablet Take 3.75 mg by mouth at bedtime.   . Multiple Vitamins-Minerals (PRESERVISION AREDS 2 PO) Take by mouth.  . pantoprazole (PROTONIX) 40 MG tablet Take 40 mg by mouth daily as needed (heartburn).   . Probiotic Product (PROBIOTIC DAILY PO) Take 1 capsule by mouth daily.   . raloxifene (EVISTA) 60 MG tablet Take 60 mg by mouth daily.  . rifaximin (XIFAXAN) 550 MG TABS tablet Take 1 tablet (550 mg total) by mouth 3 (three) times daily.  Marland Kitchen zolpidem (AMBIEN) 10 MG tablet Take 2.5 mg by mouth daily as needed for sleep.    No current facility-administered medications for this visit.  (Other)      REVIEW OF SYSTEMS:  ROS    Positive for: Cardiovascular, Eyes   Negative for: Constitutional, Gastrointestinal, Neurological, Skin, Genitourinary, Musculoskeletal, HENT, Endocrine, Respiratory, Psychiatric, Allergic/Imm, Heme/Lymph   Last edited by Zenovia Jordan, LPN on 19/62/2297 98:92 AM. (History)       ALLERGIES Allergies  Allergen Reactions  . Epinephrine Anaphylaxis  . Lidocaine Anaphylaxis  . Aspirin   . Atenolol   . Clarithromycin   . Cortisone Other (See Comments)  . Omeprazole Magnesium  Other (See Comments)    Ask at office visit  . Penicillins   . Pindolol Diarrhea    Fatigue, hip pain  . Pseudoephedrine-Guaifenesin Other (See Comments)  . Sulfonamide Derivatives   . Tetracycline Hives  . Glycol Stearate Palpitations  . Valsartan Palpitations    PAST MEDICAL HISTORY Past Medical History:  Diagnosis Date  . Dysautonomia ([place])   . Esophageal stricture   . GERD (gastroesophageal reflux disease)   . Hiatal hernia   . IBS (irritable bowel syndrome)   . Osteopenia   . Right thyroid nodule   . Squamous cell skin cancer   . Syncope    Past Surgical History:  Procedure Laterality Date  . C-EYE SURGERY PROCEDURE     30 yrs ago  . Cataract extraction    . CATARACT EXTRACTION  2001  . COLONOSCOPY  2009   Also 2002  . ESOPHAGEAL DILATION     x 4  . ESOPHAGEAL MANOMETRY    . ESOPHAGOGASTRODUODENOSCOPY  Multiple  . Exploratory laparoscopy    . EYE SURGERY    . NISSEN FUNDOPLICATION    . TOTAL ABDOMINAL HYSTERECTOMY      FAMILY HISTORY Family History  Problem Relation Age of Onset  . Multiple myeloma Father        died age 74  . Heart failure Mother        died age 75    SOCIAL HISTORY Social History   Tobacco Use  . Smoking status: Never Smoker  . Smokeless tobacco: Never Used  Substance Use Topics  . Alcohol use: No    Alcohol/week: 0.0 oz  . Drug use: No         OPHTHALMIC EXAM:  Base Eye Exam    Visual Acuity (Snellen - Linear)      Right Left   Dist cc 20/20 20/25   Dist ph cc 20/20 20/20       Tonometry (Tonopen, 10:40 AM)      Right Left   Pressure 12 14       Pupils      Dark Shape APD   Right 4 Round None   Left 4 Round None       Visual Fields (Counting fingers)      Left Right    Full Full       Extraocular Movement      Right Left    Full, Ortho Full, Ortho       Neuro/Psych    Oriented x3:  Yes   Mood/Affect:  Normal       Dilation    Both eyes:  1.0% Mydriacyl, 2.5% Phenylephrine @ 10:40 AM         Slit Lamp and Fundus Exam    External Exam      Right Left   External Normal Normal       Slit Lamp Exam      Right Left   Lids/Lashes Dermatochalasis - upper lid Dermatochalasis - upper lid   Conjunctiva/Sclera White and quiet  White and quiet   Cornea arcus arcus; ABMD   Anterior Chamber Deep and quiet Deep and quiet   Iris Round, dilated Round, dilated   Lens PCIOL; open PC PCIOL; tr PCO non central   Vitreous Vitreous syneresis, PVD Vitreous syneresis, PVD       Fundus Exam      Right Left   Disc Normal; compact Normal; compact   C/D Ratio 0.1 0.1   Macula RPE mottling; drusen; no heme or edema ERM; drusen; no heme or edema   Vessels attenuated attenuated   Periphery attached attached          IMAGING AND PROCEDURES  Imaging and Procedures for 05/31/17  OCT, Retina - OU - Both Eyes     Right Eye Quality was good. Central Foveal Thickness: 331. Progression has no prior data. Findings include no IRF, no SRF, retinal drusen  (Blunted foveal depression).   Left Eye Quality was good. Central Foveal Thickness: 385. Progression has no prior data. Findings include no IRF, no SRF, abnormal foveal contour, retinal drusen , epiretinal membrane.   Notes Images taken, stored on drive  Diagnosis / Impression:  nonexudative ARMD OU Mild ERM OS  Clinical management:  See below  Abbreviations: NFP - Normal foveal profile. CME - cystoid macular edema. PED - pigment epithelial detachment. IRF - intraretinal fluid. SRF - subretinal fluid. EZ - ellipsoid zone. ERM - epiretinal membrane. ORA - outer retinal atrophy. ORT - outer retinal tubulation. SRHM - subretinal hyper-reflective material                  ASSESSMENT/PLAN:    ICD-10-CM   1. Intermediate stage nonexudative age-related macular degeneration of both eyes H35.3132 OCT, Retina - OU - Both Eyes  2. Epiretinal membrane (ERM) of left eye H35.372   3. Pseudophakia of both eyes Z96.1     1. Age related  macular degeneration, non-exudative, both eyes  - mild-intermediate stage OU  - The incidence, anatomy, and pathology of dry AMD, risk of progression, and the AREDS and AREDS 2 study including smoking risks discussed with patient.  - Recommend amsler grid monitoring  - f/u 3-4 mos  2. Epiretinal membrane, left eye  The natural history, anatomy, potential for loss of vision, and treatment options including vitrectomy techniques and the complications of endophthalmitis, retinal detachment, vitreous hemorrhage, cataract progression and permanent vision loss discussed with the patient. - very mild ERM, BCVA 20/20 OU with minimal metamorphopsia OS - no surgical intervention indicated at this time - monitor  3. Pseudophakia OU  - s/p CE/IOL OU w/ Dr. Herbert Deaner  - beautiful surgery, doing well  - monitor   Ophthalmic Meds Ordered this visit:  No orders of the defined types were placed in this encounter.      No Follow-up on file.  There are no Patient Instructions on file for this visit.   Explained the diagnoses, plan, and follow up with the patient and they expressed understanding.  Patient expressed understanding of the importance of proper follow up care.   This document serves as a record of services personally performed by Gardiner Sleeper, MD, PhD. It was created on their behalf by Catha Brow, [place], a certified ophthalmic assistant. The creation of this record is the provider's dictation and/or activities during the visit.  Electronically signed by: Catha Brow, [place]  05/31/17 1:03 PM    Gardiner Sleeper, M.D., Ph.D. Diseases & Surgery of the Retina and Vitreous [facility]  [place] 05/31/17   I have reviewed the above documentation for accuracy and completeness, and I agree with the above. Gardiner Sleeper, M.D., Ph.D. 05/31/17 1:03 PM     Abbreviations: M myopia (nearsighted); A astigmatism; H hyperopia (farsighted); P presbyopia; Mrx spectacle  prescription;  CTL contact lenses; OD right eye; OS left eye; OU both eyes  XT exotropia; ET esotropia; PEK punctate epithelial keratitis; PEE punctate epithelial erosions; DES dry eye syndrome; MGD meibomian gland dysfunction; ATs artificial tears; PFAT's preservative free artificial tears; [place] nuclear sclerotic cataract; PSC posterior subcapsular cataract; ERM epi-retinal membrane; PVD posterior vitreous detachment; RD retinal detachment; DM diabetes mellitus; DR diabetic retinopathy; NPDR non-proliferative diabetic retinopathy; PDR proliferative diabetic retinopathy; CSME clinically significant macular edema; DME diabetic macular edema; dbh dot blot hemorrhages; CWS cotton wool spot; POAG primary open angle glaucoma; C/D cup-to-disc ratio; HVF humphrey visual field; GVF goldmann visual field; OCT optical coherence tomography; IOP intraocular pressure; BRVO Branch retinal vein occlusion; CRVO central retinal vein occlusion; CRAO central retinal artery occlusion; BRAO branch retinal artery occlusion; RT retinal tear; SB scleral buckle; PPV pars plana vitrectomy; VH Vitreous hemorrhage; PRP panretinal laser photocoagulation; IVK intravitreal kenalog; VMT vitreomacular traction; MH Macular hole;  NVD neovascularization of the disc; NVE neovascularization elsewhere; AREDS age related eye disease study; ARMD age related macular degeneration; POAG primary open angle glaucoma; EBMD epithelial/anterior basement membrane dystrophy; ACIOL anterior chamber intraocular lens; IOL intraocular lens; PCIOL posterior chamber intraocular lens; Phaco/IOL phacoemulsification with intraocular lens placement; [place] photorefractive keratectomy; LASIK laser assisted in situ keratomileusis; HTN hypertension; DM diabetes mellitus; COPD chronic obstructive pulmonary disease

## 2017-07-15 NOTE — Progress Notes (Signed)
Cardiology Office Note   Date:  07/17/2017   ID:  Michelle Keith, DOB 1932/03/04, MRN [id number]  PCP:  [name], MD Cardiologist:   Minus Breeding, MD   Chief Complaint  Patient presents with  . Palpitations     History of Present Illness: Michelle Keith is a 84 y.o. female who presents for follow up of chest pain. She did have a history of syncope. She's had a workup that has included a negative stress perfusion study in 2016. Of note there is mention of a possible VSD in her primary care notes. However, I reviewed the echo results in 2007 and don't see mention of this. She has  had palpitations and has been intolerant of calcium channel blockers and beta blockers.     She wore a Holter in  2017.  There were no significant arrhythmias.      She has had elevated blood pressures in the morning.  She is taking three quarters of a 25 mg metoprolol every night.  He does not tolerate a full dose.  Her blood pressures are elevated in the morning in the 517-616 systolic range but they go down during the day.  She does have some episodes of presyncope and she reports about 3 episodes of having to lie down she was getting ready for bed because she felt "sick".  She could not describe tachypalpitations and she was not having any chest discomfort or diaphoresis.  She just felt very weak.  She is otherwise not had any new palpitations, presyncope or syncope.  She had another week spell when she was trying to clean snow off of her car one day.   Past Medical History:  Diagnosis Date  . Dysautonomia ([place])   . Esophageal stricture   . GERD (gastroesophageal reflux disease)   . Hiatal hernia   . IBS (irritable bowel syndrome)   . Osteopenia   . Right thyroid nodule   . Squamous cell skin cancer   . Syncope     Past Surgical History:  Procedure Laterality Date  . C-EYE SURGERY PROCEDURE     30 yrs ago  . Cataract extraction    . CATARACT EXTRACTION  2001  . COLONOSCOPY  2009   Also  2002  . ESOPHAGEAL DILATION     x 4  . ESOPHAGEAL MANOMETRY    . ESOPHAGOGASTRODUODENOSCOPY  Multiple  . Exploratory laparoscopy    . EYE SURGERY    . NISSEN FUNDOPLICATION    . TOTAL ABDOMINAL HYSTERECTOMY       Current Outpatient Medications  Medication Sig Dispense Refill  . cholecalciferol (VITAMIN D) 1000 UNITS tablet Take 1,000 Units by mouth daily.     . meclizine (ANTIVERT) 25 MG tablet Take 25 mg by mouth 2 (two) times daily as needed for dizziness or nausea.    . metoprolol tartrate (LOPRESSOR) 25 MG tablet Pt takes 3/4 tablet by mouth daily    . mirtazapine (REMERON) 15 MG tablet Take 3.75 mg by mouth at bedtime.     . Multiple Vitamins-Minerals (PRESERVISION AREDS 2 PO) Take by mouth.    . pantoprazole (PROTONIX) 40 MG tablet Take 40 mg by mouth daily as needed (heartburn).     . Probiotic Product (PROBIOTIC DAILY PO) Take 1 capsule by mouth daily.     . raloxifene (EVISTA) 60 MG tablet Take 60 mg by mouth daily.    . rifaximin (XIFAXAN) 550 MG TABS tablet Take 1 tablet (550 mg total)  by mouth 3 (three) times daily. 42 tablet 0  . zolpidem (AMBIEN) 10 MG tablet Take 2.5 mg by mouth daily as needed for sleep.      No current facility-administered medications for this visit.     Allergies:   Epinephrine; Lidocaine; Aspirin; Atenolol; Clarithromycin; Cortisone; Omeprazole magnesium; Penicillins; Pindolol; Pseudoephedrine-guaifenesin; Sulfonamide derivatives; Tetracycline; Glycol stearate; and Valsartan    ROS:  Please see the history of present illness.   Otherwise, review of systems are positive for hip pain.   All other systems are reviewed and negative.    PHYSICAL EXAM: VS:  BP 121/78   Pulse 76   Ht 5\' 5"  (1.651 m)   Wt 128 lb (58.1 kg)   SpO2 97%   BMI 21.30 kg/m  , BMI Body mass index is 21.3 kg/m.  GENERAL:  Well appearing NECK:  No jugular venous distention, waveform within normal limits, carotid upstroke brisk and symmetric, no bruits, no  thyromegaly LUNGS:  Clear to auscultation bilaterally CHEST:  Unremarkable HEART:  PMI not displaced or sustained,S1 and S2 within normal limits, no S3, no S4, no clicks, no rubs, no murmurs ABD:  Flat, positive bowel sounds normal in frequency in pitch, no bruits, no rebound, no guarding, no midline pulsatile mass, no hepatomegaly, no splenomegaly EXT:  2 plus pulses throughout, no edema, no cyanosis no clubbing  EKG:  EKG is not ordered today.   Recent Labs: 11/26/2016: ALT 8 11/27/2016: BUN 8; Creatinine, Ser 0.70; Hemoglobin 10.4; Platelets 280; Potassium 3.7; Sodium 141    Lipid Panel No results found for: CHOL, TRIG, HDL, CHOLHDL, VLDL, LDLCALC, LDLDIRECT    Wt Readings from Last 3 Encounters:  07/17/17 128 lb (58.1 kg)  02/13/17 126 lb (57.2 kg)  12/26/16 125 lb 9.6 oz (57 kg)    Lab Results  Component Value Date   TSH 3.05 01/13/2016     Other studies Reviewed: Additional studies/ records that were reviewed today include: None Review of the above records demonstrates:       ASSESSMENT AND PLAN:   PALPITATIONS:    We talked about potentially wearing an event monitor to capture the weak spells that she has been having.  However, at this point she does not want to do this and she will let me know if she has any further events.   HTN: She has been very sensitive to medications.  I discussed with her that I think it is difficult for her to be taking three quarters of a pill but that is the way she needs to take it for now because of her sensitivity to medications.  In the future if she sees her blood pressure is going up or she is able she will start taking a whole pill at night.     Current medicines are reviewed at length with the patient today.  The patient does not have concerns regarding medicines.  The following changes have been made:   None  Labs/ tests ordered today include:    None  No orders of the defined types were placed in this  encounter.    Disposition:   FU with me in 12 months.     Signed, Minus Breeding, MD  07/17/2017 11:22 AM    [facility]

## 2017-07-17 ENCOUNTER — Ambulatory Visit: Payer: Medicare HMO | Admitting: Cardiology

## 2017-07-17 ENCOUNTER — Encounter: Payer: Self-pay | Admitting: Cardiology

## 2017-07-17 VITALS — BP 121/78 | HR 76 | Ht 65.0 in | Wt 128.0 lb

## 2017-07-17 DIAGNOSIS — I10 Essential (primary) hypertension: Secondary | ICD-10-CM

## 2017-07-17 DIAGNOSIS — R002 Palpitations: Secondary | ICD-10-CM

## 2017-07-17 NOTE — Patient Instructions (Signed)
Medication Instructions:  Continue current medications  If you need a refill on your cardiac medications before your next appointment, please call your pharmacy.  Labwork: None Ordered   Testing/Procedures: None Ordered  Follow-Up: Your physician wants you to follow-up in: 1 Year. You should receive a reminder letter in the mail two months in advance. If you do not receive a letter, please call our office [phone].    Thank you for choosing [facility]!!      

## 2017-07-20 ENCOUNTER — Telehealth (INDEPENDENT_AMBULATORY_CARE_PROVIDER_SITE_OTHER): Payer: Medicare HMO | Admitting: Cardiology

## 2017-07-20 DIAGNOSIS — R002 Palpitations: Secondary | ICD-10-CM

## 2017-07-20 DIAGNOSIS — R531 Weakness: Secondary | ICD-10-CM | POA: Diagnosis not present

## 2017-07-20 NOTE — Telephone Encounter (Signed)
Spoke with patients son about patient and her weakness. Last night she got up to go to the bathroom and she had to just sit for a while before she could get back to bed. Today she just doesn't feel well, no energy. Blood pressure 147/77 HR 81. Discussed with Dr Percival Spanish and will proceed with event monitor as well as get CBC. Advised son and scheduled event tomorrow at [facility] office and will either come today for labs or get tomorrow. Advised son, verbalized understanding

## 2017-07-20 NOTE — Telephone Encounter (Signed)
Gershon Mussel ( Son ) is calling because Michelle Keith has been experiencing some weakness and they bare wondering if she should do heart monitor or have an echocardiogram or take her to the ER . Please call

## 2017-07-20 NOTE — Telephone Encounter (Signed)
Patient came for labs today and requested EKG since she did not have one at recent visit. Patient has had elevated heart rate when up moving around and just has not been feeling well. Blood pressure 147/77 when EKG done. EKG done and reviewed by Dr Percival Spanish, Sinus tachycardia with HR of 101. Discussed findings with Dr Percival Spanish and will proceed with monitor and CBC as recommended. Advised patient and to go to ED if worse.

## 2017-07-21 ENCOUNTER — Ambulatory Visit (INDEPENDENT_AMBULATORY_CARE_PROVIDER_SITE_OTHER): Payer: Medicare HMO

## 2017-07-21 DIAGNOSIS — R531 Weakness: Secondary | ICD-10-CM | POA: Diagnosis not present

## 2017-07-21 DIAGNOSIS — R002 Palpitations: Secondary | ICD-10-CM | POA: Diagnosis not present

## 2017-07-21 LAB — CBC WITH DIFFERENTIAL/PLATELET
BASOS: 0 %
Basophils Absolute: 0 10*3/uL (ref 0.0–0.2)
EOS (ABSOLUTE): 0.2 10*3/uL (ref 0.0–0.4)
Eos: 1 %
HEMATOCRIT: 40.1 % (ref 34.0–46.6)
Hemoglobin: 13.4 g/dL (ref 11.1–15.9)
IMMATURE GRANULOCYTES: 0 %
Immature Grans (Abs): 0 10*3/uL (ref 0.0–0.1)
LYMPHS ABS: 1.4 10*3/uL (ref 0.7–3.1)
Lymphs: 10 %
MCH: 30.5 pg (ref 26.6–33.0)
MCHC: 33.4 g/dL (ref 31.5–35.7)
MCV: 91 fL (ref 79–97)
MONOS ABS: 1 10*3/uL — AB (ref 0.1–0.9)
Monocytes: 7 %
NEUTROS ABS: 12.1 10*3/uL — AB (ref 1.4–7.0)
NEUTROS PCT: 82 %
Platelets: 356 10*3/uL (ref 150–379)
RBC: 4.39 x10E6/uL (ref 3.77–5.28)
RDW: 13.6 % (ref 12.3–15.4)
WBC: 14.7 10*3/uL — ABNORMAL HIGH (ref 3.4–10.8)

## 2017-07-26 NOTE — Addendum Note (Signed)
Addended by: Alvina Filbert B on: 07/26/2017 02:17 PM   Modules accepted: Orders

## 2017-07-27 DIAGNOSIS — R55 Syncope and collapse: Secondary | ICD-10-CM | POA: Diagnosis not present

## 2017-07-27 DIAGNOSIS — Z6820 Body mass index (BMI) 20.0-20.9, adult: Secondary | ICD-10-CM | POA: Diagnosis not present

## 2017-07-27 DIAGNOSIS — D72829 Elevated white blood cell count, unspecified: Secondary | ICD-10-CM | POA: Diagnosis not present

## 2017-07-27 DIAGNOSIS — I10 Essential (primary) hypertension: Secondary | ICD-10-CM | POA: Diagnosis not present

## 2017-08-07 DIAGNOSIS — M25552 Pain in left hip: Secondary | ICD-10-CM | POA: Diagnosis not present

## 2017-08-11 ENCOUNTER — Telehealth: Payer: Self-pay | Admitting: Cardiology

## 2017-08-11 NOTE — Telephone Encounter (Signed)
New message    Pt c/o BP issue: STAT if pt c/o blurred vision, one-sided weakness or slurred speech  1. What are your last 5 BP readings? 2/12 189/91 185/105 2/15 183/94 2/21 180/95 174/91  2. Are you having any other symptoms (ex. Dizziness, headache, blurred vision, passed out)? no  3. What is your BP issue? She is now taking 1 full tablet of the  metoprolol tartrate (LOPRESSOR) 25 MG tablet Pt takes 3/4 tablet by mouth daily   At bedtime it is lower , but in the morning when she wakes up it is the readings she gave you.  In the morning she gets a slight headache

## 2017-08-11 NOTE — Telephone Encounter (Signed)
Per Jul 17, 2017 MD note:  HTN: She has been very sensitive to medications.  I discussed with her that I think it is difficult for her to be taking three quarters of a pill but that is the way she needs to take it for now because of her sensitivity to medications.  In the future if she sees her blood pressure is going up or she is able she will start taking a whole pill at night.

## 2017-08-11 NOTE — Telephone Encounter (Signed)
Returned call to patient of Dr. Percival Spanish concerning her BP. She provided BP readings from 2/12, 2/15, 2/21 (below). She has been taking a whole tablet of metoprolol tartrate 25mg  since Monday (she takes at night) and she has not felt like she will pass out. She reports her BP is higher in the AM and during the day and is normal at night. She hurts her head hurts a little in the morning. Advised would defer to primary cardiologist given her sensitivity to medications/changes - unsure if taking metoprolol as BID dosing would help?  2/12 189/91 185/105 2/15 183/94 2/21 180/95 915/04 1/36 438 systolic 377/93P

## 2017-08-11 NOTE — Telephone Encounter (Signed)
I would suggest that she try taking another 1/2 metoprolol in the morning.

## 2017-08-14 NOTE — Telephone Encounter (Signed)
Spoke with pt, for the last week she has increased the metoprolol to 25 mg once daily, this morning her bp was 148/82. She will try taking an extra 1/2 tablet in the evening or morning as needed for elevated bp.

## 2017-08-15 DIAGNOSIS — L821 Other seborrheic keratosis: Secondary | ICD-10-CM | POA: Diagnosis not present

## 2017-08-15 DIAGNOSIS — L814 Other melanin hyperpigmentation: Secondary | ICD-10-CM | POA: Diagnosis not present

## 2017-08-15 DIAGNOSIS — L820 Inflamed seborrheic keratosis: Secondary | ICD-10-CM | POA: Diagnosis not present

## 2017-08-15 DIAGNOSIS — L570 Actinic keratosis: Secondary | ICD-10-CM | POA: Diagnosis not present

## 2017-08-28 NOTE — Progress Notes (Signed)
[facility] Note  08/29/2017     CHIEF COMPLAINT Patient presents for Retina Follow Up   HISTORY OF PRESENT ILLNESS: Michelle Keith is a 84 y.o. female who presents to the clinic today for:   HPI    Retina Follow Up    Patient presents with  Dry AMD.  In both eyes.  Severity is moderate.  Duration of 3 months.  Since onset it is stable.  I, the attending physician,  performed the HPI with the patient and updated documentation appropriately.          Comments    Pt presents today for F/U for non-exu AMD OU, pt states she was sick 2 weeks ago and noticed decreased vision then as well as pain OU, pt states that is getting better, but that OD is still painful, pt has been told the pain may be because of dry eyes, pt says she does see floaters, but denies flashes and wavy vision, pt denies being diabetic, pt uses Thera tears gtts, pt takes AREDS vits,        Last edited by Bernarda Caffey, MD on 08/29/2017 10:31 AM. (History)    Pt reports she has been sick for the past 2 weeks; Pt states she is getting better; Pt reports she has been having trouble with dryness OU, reports using thera tears OU prn;   Referring physician: [name], MD [place], [place] [id number]  HISTORICAL INFORMATION:   Selected notes from the [place] Referred by Dr. Bing Plume for evaluation of AMD OU LEE- 11.29.18 (D. Digby) [BCVA OD: 20/25 OS: 20/25] Ocular Hx- pseudophakia OU ([place]), AMD OU, S/P bil bleph, ischemic optic neuropathy OS PMH- HTN    CURRENT MEDICATIONS: No current outpatient medications on file. (Ophthalmic Drugs)   No current facility-administered medications for this visit.  (Ophthalmic Drugs)   Current Outpatient Medications (Other)  Medication Sig  . cholecalciferol (VITAMIN D) 1000 UNITS tablet Take 1,000 Units by mouth daily.   . cholecalciferol (VITAMIN D) 1000 units tablet Vitamin D  . meclizine (ANTIVERT) 12.5 MG tablet  meclizine 12.5 mg tablet  Take 2 tablets 3 times a day by oral route.  . meclizine (ANTIVERT) 25 MG tablet Take 25 mg by mouth 2 (two) times daily as needed for dizziness or nausea.  . metoprolol tartrate (LOPRESSOR) 25 MG tablet Pt takes 3/4 tablet by mouth daily  . mirtazapine (REMERON) 15 MG tablet Take 3.75 mg by mouth at bedtime.   . Multiple Vitamins-Minerals (PRESERVISION AREDS 2 PO) Take by mouth.  . Multiple Vitamins-Minerals (PRESERVISION AREDS 2+MULTI VIT PO) PreserVision AREDS  . pantoprazole (PROTONIX) 20 MG tablet Protonix  . pantoprazole (PROTONIX) 40 MG tablet Take 40 mg by mouth daily as needed (heartburn).   . Probiotic Product (PROBIOTIC DAILY PO) Take 1 capsule by mouth daily.   . Probiotic Product (PROBIOTIC-10 PO) Probiotic  . raloxifene (EVISTA) 60 MG tablet Take 60 mg by mouth daily.  . rifaximin (XIFAXAN) 550 MG TABS tablet Take 1 tablet (550 mg total) by mouth 3 (three) times daily.  Marland Kitchen zolpidem (AMBIEN) 10 MG tablet Take 2.5 mg by mouth daily as needed for sleep.   Marland Kitchen zolpidem (AMBIEN) 10 MG tablet Ambien 10 mg tablet  Take 1 tablet every day by oral route.   No current facility-administered medications for this visit.  (Other)      REVIEW OF SYSTEMS: ROS    Positive for: Eyes  Negative for: Constitutional, Gastrointestinal, Neurological, Skin, Genitourinary, Musculoskeletal, HENT, Endocrine, Cardiovascular, Respiratory, Psychiatric, Allergic/Imm, Heme/Lymph   Last edited by Debbrah Alar, COT on 08/29/2017  9:39 AM. (History)       ALLERGIES Allergies  Allergen Reactions  . Epinephrine Anaphylaxis  . Lidocaine Anaphylaxis  . Aspirin   . Atenolol   . Clarithromycin   . Cortisone Other (See Comments)  . Omeprazole Magnesium Other (See Comments)    Ask at office visit  . Penicillins   . Pindolol Diarrhea    Fatigue, hip pain  . Pseudoephedrine-Guaifenesin Other (See Comments)  . Sulfonamide Derivatives   . Tetracycline Hives  . Glycol Stearate  Palpitations  . Valsartan Palpitations    PAST MEDICAL HISTORY Past Medical History:  Diagnosis Date  . Dysautonomia ([place])   . Esophageal stricture   . GERD (gastroesophageal reflux disease)   . Hiatal hernia   . IBS (irritable bowel syndrome)   . Osteopenia   . Right thyroid nodule   . Squamous cell skin cancer   . Syncope    Past Surgical History:  Procedure Laterality Date  . C-EYE SURGERY PROCEDURE     30 yrs ago  . Cataract extraction    . CATARACT EXTRACTION  2001  . COLONOSCOPY  2009   Also 2002  . ESOPHAGEAL DILATION     x 4  . ESOPHAGEAL MANOMETRY    . ESOPHAGOGASTRODUODENOSCOPY  Multiple  . Exploratory laparoscopy    . EYE SURGERY    . NISSEN FUNDOPLICATION    . TOTAL ABDOMINAL HYSTERECTOMY      FAMILY HISTORY Family History  Problem Relation Age of Onset  . Multiple myeloma Father        died age 82  . Heart failure Mother        died age 26    SOCIAL HISTORY Social History   Tobacco Use  . Smoking status: Never Smoker  . Smokeless tobacco: Never Used  Substance Use Topics  . Alcohol use: No    Alcohol/week: 0.0 oz  . Drug use: No         OPHTHALMIC EXAM:  Base Eye Exam    Visual Acuity (Snellen - Linear)      Right Left   Dist cc 20/20 -1 20/30 -1   Dist ph cc NI NI   Correction:  Glasses       Tonometry (Tonopen, 9:49 AM)      Right Left   Pressure 11 15       Pupils      Dark Light Shape React APD   Right 4 2 Round Brisk None   Left 4 2 Round Brisk None       Visual Fields (Counting fingers)      Left Right    Full Full       Extraocular Movement      Right Left    Full, Ortho Full, Ortho       Neuro/Psych    Oriented x3:  Yes   Mood/Affect:  Normal       Dilation    Both eyes:  1.0% Mydriacyl, 2.5% Phenylephrine @ 9:49 AM        Slit Lamp and Fundus Exam    External Exam      Right Left   External Normal Normal       Slit Lamp Exam      Right Left   Lids/Lashes Dermatochalasis - upper lid,  Meibomian gland dysfunction Dermatochalasis -  upper lid   Conjunctiva/Sclera White and quiet White and quiet   Cornea arcus, Inferior 1-2+ Punctate epithelial erosions arcus; ABMD, 1+ Punctate epithelial erosions   Anterior Chamber Deep and quiet Deep and quiet   Iris Round, dilated Round, dilated   Lens PCIOL; open PC PCIOL; tr PCO non central   Vitreous Vitreous syneresis, PVD Vitreous syneresis, PVD       Fundus Exam      Right Left   Disc Normal; compact Normal; compact   C/D Ratio 0.1 0.1   Macula RPE mottling; drusen; no heme or edema Blunted foveal reflex, ERM; drusen; no heme or edema   Vessels attenuated attenuated   Periphery Attached, cobblestoning at 0700 attached        Refraction    Wearing Rx      Sphere Cylinder Axis   Right +0.25 Sphere    Left -1.00 +0.50 152       Manifest Refraction      Sphere Cylinder Axis Dist VA   Right +0.25 Sphere     Left -1.50 +0.50 015 20/20-2          IMAGING AND PROCEDURES  Imaging and Procedures for 08/29/17  OCT, Retina - OU - Both Eyes     Right Eye Quality was good. Central Foveal Thickness: 334. Progression has been stable. Findings include no IRF, no SRF, retinal drusen , abnormal foveal contour (Blunted foveal depression).   Left Eye Quality was good. Central Foveal Thickness: 392. Progression has been stable. Findings include no IRF, no SRF, abnormal foveal contour, retinal drusen , epiretinal membrane.   Notes Images taken, stored on drive  Diagnosis / Impression:  nonexudative ARMD OU ERM OS  Clinical management:  See below  Abbreviations: NFP - Normal foveal profile. CME - cystoid macular edema. PED - pigment epithelial detachment. IRF - intraretinal fluid. SRF - subretinal fluid. EZ - ellipsoid zone. ERM - epiretinal membrane. ORA - outer retinal atrophy. ORT - outer retinal tubulation. SRHM - subretinal hyper-reflective material                  ASSESSMENT/PLAN:    ICD-10-CM   1.  Intermediate stage nonexudative age-related macular degeneration of both eyes H35.3132 OCT, Retina - OU - Both Eyes  2. Epiretinal membrane (ERM) of left eye H35.372 OCT, Retina - OU - Both Eyes  3. Pseudophakia of both eyes Z96.1     1. Age related macular degeneration, non-exudative, both eyes  - mild-intermediate stage OU  - The incidence, anatomy, and pathology of dry AMD, risk of progression, and the AREDS and AREDS 2 study including smoking risks discussed with patient.  - continue amsler grid monitoring  - f/u 4 mos, sooner prn  2. Epiretinal membrane, left eye  The natural history, anatomy, potential for loss of vision, and treatment options including vitrectomy techniques and the complications of endophthalmitis, retinal detachment, vitreous hemorrhage, cataract progression and permanent vision loss discussed with the patient. - very mild ERM, BCVA 20/20 OU with minimal metamorphopsia OS - no surgical intervention indicated at this time - monitor  3. Pseudophakia OU  - s/p CE/IOL OU w/ Dr. Herbert Deaner  - beautiful surgery, doing well  - monitor   Ophthalmic Meds Ordered this visit:  No orders of the defined types were placed in this encounter.      Return in about 4 months (around 12/29/2017) for F/U Non-Exu AMD OU.  There are no Patient Instructions on file for this visit.  Explained the diagnoses, plan, and follow up with the patient and they expressed understanding.  Patient expressed understanding of the importance of proper follow up care.   This document serves as a record of services personally performed by Gardiner Sleeper, MD, PhD. It was created on their behalf by Catha Brow, [place], a certified ophthalmic assistant. The creation of this record is the provider's dictation and/or activities during the visit.  Electronically signed by: Catha Brow, [place]  08/29/17 1:20 PM   Gardiner Sleeper, M.D., Ph.D. Diseases & Surgery of the Retina and [place] 08/29/17   I have reviewed the above documentation for accuracy and completeness, and I agree with the above. Gardiner Sleeper, M.D., Ph.D. 08/29/17 1:21 PM     Abbreviations: M myopia (nearsighted); A astigmatism; H hyperopia (farsighted); P presbyopia; Mrx spectacle prescription;  CTL contact lenses; OD right eye; OS left eye; OU both eyes  XT exotropia; ET esotropia; PEK punctate epithelial keratitis; PEE punctate epithelial erosions; DES dry eye syndrome; MGD meibomian gland dysfunction; ATs artificial tears; PFAT's preservative free artificial tears; [place] nuclear sclerotic cataract; PSC posterior subcapsular cataract; ERM epi-retinal membrane; PVD posterior vitreous detachment; RD retinal detachment; DM diabetes mellitus; DR diabetic retinopathy; NPDR non-proliferative diabetic retinopathy; PDR proliferative diabetic retinopathy; CSME clinically significant macular edema; DME diabetic macular edema; dbh dot blot hemorrhages; CWS cotton wool spot; POAG primary open angle glaucoma; C/D cup-to-disc ratio; HVF humphrey visual field; GVF goldmann visual field; OCT optical coherence tomography; IOP intraocular pressure; BRVO Branch retinal vein occlusion; CRVO central retinal vein occlusion; CRAO central retinal artery occlusion; BRAO branch retinal artery occlusion; RT retinal tear; SB scleral buckle; PPV pars plana vitrectomy; VH Vitreous hemorrhage; PRP panretinal laser photocoagulation; IVK intravitreal kenalog; VMT vitreomacular traction; MH Macular hole;  NVD neovascularization of the disc; NVE neovascularization elsewhere; AREDS age related eye disease study; ARMD age related macular degeneration; POAG primary open angle glaucoma; EBMD epithelial/anterior basement membrane dystrophy; ACIOL anterior chamber intraocular lens; IOL intraocular lens; PCIOL posterior chamber intraocular lens; Phaco/IOL phacoemulsification with intraocular lens placement; [place] photorefractive  keratectomy; LASIK laser assisted in situ keratomileusis; HTN hypertension; DM diabetes mellitus; COPD chronic obstructive pulmonary disease

## 2017-08-29 ENCOUNTER — Encounter (INDEPENDENT_AMBULATORY_CARE_PROVIDER_SITE_OTHER): Payer: Self-pay | Admitting: Ophthalmology

## 2017-08-29 ENCOUNTER — Ambulatory Visit (INDEPENDENT_AMBULATORY_CARE_PROVIDER_SITE_OTHER): Payer: Medicare HMO | Admitting: Ophthalmology

## 2017-08-29 DIAGNOSIS — Z961 Presence of intraocular lens: Secondary | ICD-10-CM | POA: Diagnosis not present

## 2017-08-29 DIAGNOSIS — H35372 Puckering of macula, left eye: Secondary | ICD-10-CM

## 2017-08-29 DIAGNOSIS — H353132 Nonexudative age-related macular degeneration, bilateral, intermediate dry stage: Secondary | ICD-10-CM

## 2017-09-04 ENCOUNTER — Telehealth: Payer: Self-pay | Admitting: Cardiology

## 2017-09-04 NOTE — Telephone Encounter (Signed)
Follow Up:    Pt would like her Event Monitor results please.

## 2017-09-04 NOTE — Telephone Encounter (Signed)
Pt aware of her monitor 

## 2017-09-15 ENCOUNTER — Telehealth: Payer: Self-pay | Admitting: Cardiology

## 2017-09-15 NOTE — Telephone Encounter (Signed)
New Message:     Pt was given samples of  Xarelto and pt has run out. Pt would like to come an pick up more if possible.

## 2017-09-18 NOTE — Telephone Encounter (Signed)
Samples left at front desk pt made aware

## 2017-10-23 ENCOUNTER — Telehealth: Payer: Self-pay | Admitting: Cardiology

## 2017-10-23 NOTE — Telephone Encounter (Signed)
New Message:      Pt is calling to see if we have any Xarelto samples she can come pick up.

## 2017-10-23 NOTE — Telephone Encounter (Signed)
Pt requesting samples for husband. Separate encounter made on husband chart.

## 2017-10-28 DIAGNOSIS — S82832A Other fracture of upper and lower end of left fibula, initial encounter for closed fracture: Secondary | ICD-10-CM | POA: Diagnosis not present

## 2017-11-03 DIAGNOSIS — S8262XA Displaced fracture of lateral malleolus of left fibula, initial encounter for closed fracture: Secondary | ICD-10-CM | POA: Diagnosis not present

## 2017-11-03 DIAGNOSIS — M25579 Pain in unspecified ankle and joints of unspecified foot: Secondary | ICD-10-CM

## 2017-11-03 DIAGNOSIS — M25572 Pain in left ankle and joints of left foot: Secondary | ICD-10-CM | POA: Diagnosis not present

## 2017-11-03 HISTORY — DX: Pain in unspecified ankle and joints of unspecified foot: M25.579

## 2017-11-07 DIAGNOSIS — Z9181 History of falling: Secondary | ICD-10-CM | POA: Diagnosis not present

## 2017-11-07 DIAGNOSIS — S82402D Unspecified fracture of shaft of left fibula, subsequent encounter for closed fracture with routine healing: Secondary | ICD-10-CM | POA: Diagnosis not present

## 2017-11-07 DIAGNOSIS — Z993 Dependence on wheelchair: Secondary | ICD-10-CM | POA: Diagnosis not present

## 2017-11-09 DIAGNOSIS — Z993 Dependence on wheelchair: Secondary | ICD-10-CM | POA: Diagnosis not present

## 2017-11-09 DIAGNOSIS — S82402D Unspecified fracture of shaft of left fibula, subsequent encounter for closed fracture with routine healing: Secondary | ICD-10-CM | POA: Diagnosis not present

## 2017-11-09 DIAGNOSIS — Z9181 History of falling: Secondary | ICD-10-CM | POA: Diagnosis not present

## 2017-11-10 DIAGNOSIS — S82402D Unspecified fracture of shaft of left fibula, subsequent encounter for closed fracture with routine healing: Secondary | ICD-10-CM | POA: Diagnosis not present

## 2017-11-10 DIAGNOSIS — Z9181 History of falling: Secondary | ICD-10-CM | POA: Diagnosis not present

## 2017-11-10 DIAGNOSIS — Z993 Dependence on wheelchair: Secondary | ICD-10-CM | POA: Diagnosis not present

## 2017-11-14 DIAGNOSIS — S82402D Unspecified fracture of shaft of left fibula, subsequent encounter for closed fracture with routine healing: Secondary | ICD-10-CM | POA: Diagnosis not present

## 2017-11-14 DIAGNOSIS — Z993 Dependence on wheelchair: Secondary | ICD-10-CM | POA: Diagnosis not present

## 2017-11-14 DIAGNOSIS — Z9181 History of falling: Secondary | ICD-10-CM | POA: Diagnosis not present

## 2017-11-15 DIAGNOSIS — I7090 Unspecified atherosclerosis: Secondary | ICD-10-CM | POA: Diagnosis not present

## 2017-11-15 DIAGNOSIS — E559 Vitamin D deficiency, unspecified: Secondary | ICD-10-CM | POA: Diagnosis not present

## 2017-11-15 DIAGNOSIS — K567 Ileus, unspecified: Secondary | ICD-10-CM | POA: Diagnosis not present

## 2017-11-15 DIAGNOSIS — F3289 Other specified depressive episodes: Secondary | ICD-10-CM | POA: Diagnosis not present

## 2017-11-15 DIAGNOSIS — I10 Essential (primary) hypertension: Secondary | ICD-10-CM | POA: Diagnosis not present

## 2017-11-15 DIAGNOSIS — H268 Other specified cataract: Secondary | ICD-10-CM | POA: Diagnosis not present

## 2017-11-15 DIAGNOSIS — Z0000 Encounter for general adult medical examination without abnormal findings: Secondary | ICD-10-CM | POA: Diagnosis not present

## 2017-11-15 DIAGNOSIS — M542 Cervicalgia: Secondary | ICD-10-CM | POA: Diagnosis not present

## 2017-11-15 DIAGNOSIS — Q210 Ventricular septal defect: Secondary | ICD-10-CM | POA: Diagnosis not present

## 2017-11-15 DIAGNOSIS — D72829 Elevated white blood cell count, unspecified: Secondary | ICD-10-CM | POA: Diagnosis not present

## 2017-11-16 DIAGNOSIS — S82402D Unspecified fracture of shaft of left fibula, subsequent encounter for closed fracture with routine healing: Secondary | ICD-10-CM | POA: Diagnosis not present

## 2017-11-16 DIAGNOSIS — Z993 Dependence on wheelchair: Secondary | ICD-10-CM | POA: Diagnosis not present

## 2017-11-16 DIAGNOSIS — Z9181 History of falling: Secondary | ICD-10-CM | POA: Diagnosis not present

## 2017-11-17 ENCOUNTER — Other Ambulatory Visit: Payer: Self-pay | Admitting: Internal Medicine

## 2017-11-17 DIAGNOSIS — R221 Localized swelling, mass and lump, neck: Secondary | ICD-10-CM

## 2017-11-17 DIAGNOSIS — S8265XD Nondisplaced fracture of lateral malleolus of left fibula, subsequent encounter for closed fracture with routine healing: Secondary | ICD-10-CM | POA: Diagnosis not present

## 2017-11-17 DIAGNOSIS — M25572 Pain in left ankle and joints of left foot: Secondary | ICD-10-CM | POA: Diagnosis not present

## 2017-11-21 DIAGNOSIS — Z993 Dependence on wheelchair: Secondary | ICD-10-CM | POA: Diagnosis not present

## 2017-11-21 DIAGNOSIS — Z9181 History of falling: Secondary | ICD-10-CM | POA: Diagnosis not present

## 2017-11-21 DIAGNOSIS — S82402D Unspecified fracture of shaft of left fibula, subsequent encounter for closed fracture with routine healing: Secondary | ICD-10-CM | POA: Diagnosis not present

## 2017-11-22 DIAGNOSIS — S82402D Unspecified fracture of shaft of left fibula, subsequent encounter for closed fracture with routine healing: Secondary | ICD-10-CM | POA: Diagnosis not present

## 2017-11-22 DIAGNOSIS — Z9181 History of falling: Secondary | ICD-10-CM | POA: Diagnosis not present

## 2017-11-22 DIAGNOSIS — Z993 Dependence on wheelchair: Secondary | ICD-10-CM | POA: Diagnosis not present

## 2017-11-23 DIAGNOSIS — Z9181 History of falling: Secondary | ICD-10-CM | POA: Diagnosis not present

## 2017-11-23 DIAGNOSIS — S82402D Unspecified fracture of shaft of left fibula, subsequent encounter for closed fracture with routine healing: Secondary | ICD-10-CM | POA: Diagnosis not present

## 2017-11-23 DIAGNOSIS — Z993 Dependence on wheelchair: Secondary | ICD-10-CM | POA: Diagnosis not present

## 2017-11-28 DIAGNOSIS — S82402D Unspecified fracture of shaft of left fibula, subsequent encounter for closed fracture with routine healing: Secondary | ICD-10-CM | POA: Diagnosis not present

## 2017-11-28 DIAGNOSIS — Z9181 History of falling: Secondary | ICD-10-CM | POA: Diagnosis not present

## 2017-11-28 DIAGNOSIS — Z993 Dependence on wheelchair: Secondary | ICD-10-CM | POA: Diagnosis not present

## 2017-11-29 ENCOUNTER — Other Ambulatory Visit: Payer: Self-pay | Admitting: Internal Medicine

## 2017-11-29 DIAGNOSIS — R221 Localized swelling, mass and lump, neck: Secondary | ICD-10-CM

## 2017-11-30 DIAGNOSIS — Z993 Dependence on wheelchair: Secondary | ICD-10-CM | POA: Diagnosis not present

## 2017-11-30 DIAGNOSIS — S82402D Unspecified fracture of shaft of left fibula, subsequent encounter for closed fracture with routine healing: Secondary | ICD-10-CM | POA: Diagnosis not present

## 2017-11-30 DIAGNOSIS — Z9181 History of falling: Secondary | ICD-10-CM | POA: Diagnosis not present

## 2017-12-05 DIAGNOSIS — Z993 Dependence on wheelchair: Secondary | ICD-10-CM | POA: Diagnosis not present

## 2017-12-05 DIAGNOSIS — S82402D Unspecified fracture of shaft of left fibula, subsequent encounter for closed fracture with routine healing: Secondary | ICD-10-CM | POA: Diagnosis not present

## 2017-12-05 DIAGNOSIS — Z9181 History of falling: Secondary | ICD-10-CM | POA: Diagnosis not present

## 2017-12-06 DIAGNOSIS — S82402D Unspecified fracture of shaft of left fibula, subsequent encounter for closed fracture with routine healing: Secondary | ICD-10-CM | POA: Diagnosis not present

## 2017-12-06 DIAGNOSIS — Z993 Dependence on wheelchair: Secondary | ICD-10-CM | POA: Diagnosis not present

## 2017-12-06 DIAGNOSIS — Z9181 History of falling: Secondary | ICD-10-CM | POA: Diagnosis not present

## 2017-12-07 DIAGNOSIS — Z993 Dependence on wheelchair: Secondary | ICD-10-CM | POA: Diagnosis not present

## 2017-12-07 DIAGNOSIS — S82402D Unspecified fracture of shaft of left fibula, subsequent encounter for closed fracture with routine healing: Secondary | ICD-10-CM | POA: Diagnosis not present

## 2017-12-07 DIAGNOSIS — Z9181 History of falling: Secondary | ICD-10-CM | POA: Diagnosis not present

## 2017-12-12 DIAGNOSIS — Z9181 History of falling: Secondary | ICD-10-CM | POA: Diagnosis not present

## 2017-12-12 DIAGNOSIS — S82402D Unspecified fracture of shaft of left fibula, subsequent encounter for closed fracture with routine healing: Secondary | ICD-10-CM | POA: Diagnosis not present

## 2017-12-12 DIAGNOSIS — Z993 Dependence on wheelchair: Secondary | ICD-10-CM | POA: Diagnosis not present

## 2017-12-14 DIAGNOSIS — Z9181 History of falling: Secondary | ICD-10-CM | POA: Diagnosis not present

## 2017-12-14 DIAGNOSIS — S82402D Unspecified fracture of shaft of left fibula, subsequent encounter for closed fracture with routine healing: Secondary | ICD-10-CM | POA: Diagnosis not present

## 2017-12-14 DIAGNOSIS — Z993 Dependence on wheelchair: Secondary | ICD-10-CM | POA: Diagnosis not present

## 2017-12-15 ENCOUNTER — Encounter (HOSPITAL_COMMUNITY): Payer: Medicare HMO

## 2017-12-15 DIAGNOSIS — M25572 Pain in left ankle and joints of left foot: Secondary | ICD-10-CM | POA: Diagnosis not present

## 2017-12-15 DIAGNOSIS — S8265XD Nondisplaced fracture of lateral malleolus of left fibula, subsequent encounter for closed fracture with routine healing: Secondary | ICD-10-CM | POA: Diagnosis not present

## 2017-12-19 DIAGNOSIS — Z993 Dependence on wheelchair: Secondary | ICD-10-CM | POA: Diagnosis not present

## 2017-12-19 DIAGNOSIS — Z9181 History of falling: Secondary | ICD-10-CM | POA: Diagnosis not present

## 2017-12-19 DIAGNOSIS — S82402D Unspecified fracture of shaft of left fibula, subsequent encounter for closed fracture with routine healing: Secondary | ICD-10-CM | POA: Diagnosis not present

## 2017-12-22 DIAGNOSIS — S82402D Unspecified fracture of shaft of left fibula, subsequent encounter for closed fracture with routine healing: Secondary | ICD-10-CM | POA: Diagnosis not present

## 2017-12-22 DIAGNOSIS — Z9181 History of falling: Secondary | ICD-10-CM | POA: Diagnosis not present

## 2017-12-22 DIAGNOSIS — Z993 Dependence on wheelchair: Secondary | ICD-10-CM | POA: Diagnosis not present

## 2017-12-25 ENCOUNTER — Other Ambulatory Visit: Payer: Medicare HMO

## 2017-12-25 NOTE — Progress Notes (Signed)
[facility] Note  12/26/2017     CHIEF COMPLAINT Patient presents for Retina Follow Up   HISTORY OF PRESENT ILLNESS: Michelle Keith is a 84 y.o. female who presents to the clinic today for:   HPI    Retina Follow Up    Patient presents with  Dry AMD.  In both eyes.  Severity is moderate.  Duration of 4 months.  Since onset it is stable.  I, the attending physician,  performed the HPI with the patient and updated documentation appropriately.          Comments    Pt presents for Non-exu ARMD, pt states she feels like she is not seeing as well, she states in the past 4 months she has broken her ankle and her husband and brother both passed away, pt states she has a floater OD, but denies pain, flashes or wavy vision, pt uses Thera Tears PRN for dryness, and uses Preservision vits       Last edited by Bernarda Caffey, MD on 12/26/2017 10:31 AM. (History)      Referring physician: [name], MD [place], [place] [address]  HISTORICAL INFORMATION:   Selected notes from the [facility] Referred by Dr. Bing Plume for evaluation of AMD OU LEE- 11.29.18 (D. Digby) [BCVA OD: 20/25 OS: 20/25] Ocular Hx- pseudophakia OU ([place]), AMD OU, S/P bil bleph, ischemic optic neuropathy OS PMH- HTN    CURRENT MEDICATIONS: No current outpatient medications on file. (Ophthalmic Drugs)   No current facility-administered medications for this visit.  (Ophthalmic Drugs)   Current Outpatient Medications (Other)  Medication Sig  . cholecalciferol (VITAMIN D) 1000 UNITS tablet Take 1,000 Units by mouth daily.   . cholecalciferol (VITAMIN D) 1000 units tablet Vitamin D  . meclizine (ANTIVERT) 12.5 MG tablet meclizine 12.5 mg tablet  Take 2 tablets 3 times a day by oral route.  . meclizine (ANTIVERT) 25 MG tablet Take 25 mg by mouth 2 (two) times daily as needed for dizziness or nausea.  . metoprolol tartrate (LOPRESSOR) 25 MG tablet Pt takes 3/4 tablet by  mouth daily  . mirtazapine (REMERON) 15 MG tablet Take 3.75 mg by mouth at bedtime.   . Multiple Vitamins-Minerals (PRESERVISION AREDS 2 PO) Take by mouth.  . Multiple Vitamins-Minerals (PRESERVISION AREDS 2+MULTI VIT PO) PreserVision AREDS  . pantoprazole (PROTONIX) 20 MG tablet Protonix  . pantoprazole (PROTONIX) 40 MG tablet Take 40 mg by mouth daily as needed (heartburn).   . Probiotic Product (PROBIOTIC DAILY PO) Take 1 capsule by mouth daily.   . Probiotic Product (PROBIOTIC-10 PO) Probiotic  . raloxifene (EVISTA) 60 MG tablet Take 60 mg by mouth daily.  . rifaximin (XIFAXAN) 550 MG TABS tablet Take 1 tablet (550 mg total) by mouth 3 (three) times daily.  Marland Kitchen zolpidem (AMBIEN) 10 MG tablet Take 2.5 mg by mouth daily as needed for sleep.   Marland Kitchen zolpidem (AMBIEN) 10 MG tablet Ambien 10 mg tablet  Take 1 tablet every day by oral route.   No current facility-administered medications for this visit.  (Other)      REVIEW OF SYSTEMS: ROS    Positive for: Eyes   Negative for: Constitutional, Gastrointestinal, Neurological, Skin, Genitourinary, Musculoskeletal, HENT, Endocrine, Cardiovascular, Respiratory, Psychiatric, Allergic/Imm, Heme/Lymph   Last edited by Debbrah Alar, COT on 12/26/2017 10:17 AM. (History)       ALLERGIES Allergies  Allergen Reactions  . Epinephrine Anaphylaxis  . Lidocaine Anaphylaxis  .  Aspirin   . Atenolol   . Clarithromycin   . Cortisone Other (See Comments)  . Omeprazole Magnesium Other (See Comments)    Ask at office visit  . Penicillins   . Pindolol Diarrhea    Fatigue, hip pain  . Pseudoephedrine-Guaifenesin Other (See Comments)  . Sulfonamide Derivatives   . Tetracycline Hives  . Glycol Stearate Palpitations  . Valsartan Palpitations    PAST MEDICAL HISTORY Past Medical History:  Diagnosis Date  . Dysautonomia ([place])   . Esophageal stricture   . GERD (gastroesophageal reflux disease)   . Hiatal hernia   . IBS (irritable bowel syndrome)    . Osteopenia   . Right thyroid nodule   . Squamous cell skin cancer   . Syncope    Past Surgical History:  Procedure Laterality Date  . C-EYE SURGERY PROCEDURE     30 yrs ago  . Cataract extraction    . CATARACT EXTRACTION  2001  . COLONOSCOPY  2009   Also 2002  . ESOPHAGEAL DILATION     x 4  . ESOPHAGEAL MANOMETRY    . ESOPHAGOGASTRODUODENOSCOPY  Multiple  . Exploratory laparoscopy    . EYE SURGERY    . NISSEN FUNDOPLICATION    . TOTAL ABDOMINAL HYSTERECTOMY      FAMILY HISTORY Family History  Problem Relation Age of Onset  . Multiple myeloma Father        died age 60  . Heart failure Mother        died age 62    SOCIAL HISTORY Social History   Tobacco Use  . Smoking status: Never Smoker  . Smokeless tobacco: Never Used  Substance Use Topics  . Alcohol use: No    Alcohol/week: 0.0 oz  . Drug use: No         OPHTHALMIC EXAM:  Base Eye Exam    Visual Acuity (Snellen - Linear)      Right Left   Dist [place] 20/20 -1 20/40 -1   Dist ph [place] 20/20 NI       Tonometry (Tonopen, 10:23 AM)      Right Left   Pressure 13 15       Pupils      Dark Light Shape React APD   Right 4 2 Round Brisk None   Left 4 2 Round Brisk None       Visual Fields (Counting fingers)      Left Right    Full Full       Extraocular Movement      Right Left    Full, Ortho Full, Ortho       Neuro/Psych    Oriented x3:  Yes   Mood/Affect:  Normal       Dilation    Both eyes:  1.0% Mydriacyl, 2.5% Phenylephrine @ 10:23 AM        Slit Lamp and Fundus Exam    External Exam      Right Left   External Normal Normal       Slit Lamp Exam      Right Left   Lids/Lashes Dermatochalasis - upper lid, Meibomian gland dysfunction Dermatochalasis - upper lid   Conjunctiva/Sclera White and quiet White and quiet   Cornea arcus, Inferior 1-2+ peripheral Punctate epithelial erosions, Anterior basement membrane dystrophy arcus; ABMD, 3+ peripheral Punctate epithelial erosions,  Anterior basement membrane dystrophy   Anterior Chamber Deep and quiet Deep and quiet   Iris Round, dilated Round, dilated  Lens PCIOL; open PC PCIOL; tr PCO non central   Vitreous Vitreous syneresis, PVD Vitreous syneresis, PVD       Fundus Exam      Right Left   Disc Normal; compact Normal; compact   C/D Ratio 0.1 0.1   Macula RPE mottling and clumping; drusen; no heme or edema Blunted foveal reflex, ERM; drusen; no heme or edema   Vessels attenuated attenuated   Periphery Attached, cobblestoning at 0700, mild Reticular degeneration attached          IMAGING AND PROCEDURES  Imaging and Procedures for 08/29/17  OCT, Retina - OU - Both Eyes       Right Eye Quality was good. Central Foveal Thickness: 337. Progression has been stable. Findings include no IRF, no SRF, retinal drusen , abnormal foveal contour (Blunted foveal depression).   Left Eye Quality was good. Central Foveal Thickness: 395. Progression has been stable. Findings include no IRF, no SRF, abnormal foveal contour, retinal drusen , epiretinal membrane.   Notes Images taken, stored on drive  Diagnosis / Impression:  nonexudative ARMD OU ERM OS  Clinical management:  See below  Abbreviations: NFP - Normal foveal profile. CME - cystoid macular edema. PED - pigment epithelial detachment. IRF - intraretinal fluid. SRF - subretinal fluid. EZ - ellipsoid zone. ERM - epiretinal membrane. ORA - outer retinal atrophy. ORT - outer retinal tubulation. SRHM - subretinal hyper-reflective material                  ASSESSMENT/PLAN:    ICD-10-CM   1. Intermediate stage nonexudative age-related macular degeneration of both eyes H35.3132   2. Epiretinal membrane (ERM) of left eye H35.372   3. Pseudophakia of both eyes Z96.1   4. Retinal edema H35.81 OCT, Retina - OU - Both Eyes    1. Age related macular degeneration, non-exudative, both eyes  - mild-intermediate stage OU -- stable  - The incidence, anatomy,  and pathology of dry AMD, risk of progression, and the AREDS and AREDS 2 study including smoking risks discussed with patient.  - continue amsler grid monitoring  - f/u 6-9 months, sooner prn  2. Epiretinal membrane, left eye  The natural history, anatomy, potential for loss of vision, and treatment options including vitrectomy techniques and the complications of endophthalmitis, retinal detachment, vitreous hemorrhage, cataract progression and permanent vision loss discussed with the patient. - very mild ERM, BCVA 20/20 OU with minimal metamorphopsia OS - stable from prior - no surgical intervention indicated at this time - monitor - f/u in 6-9 mos  3. Pseudophakia OU  - s/p CE/IOL OU w/ Dr. Herbert Deaner  - beautiful surgery, doing well  - monitor   Ophthalmic Meds Ordered this visit:  No orders of the defined types were placed in this encounter.      Return in about 9 months (around 09/27/2018) for F/U Non-Exu AMD OU, DFE, OCT.  There are no Patient Instructions on file for this visit.   This document serves as a record of services personally performed by Gardiner Sleeper, MD, PhD. It was created on their behalf by Ernest Mallick, OA, an ophthalmic assistant. The creation of this record is the provider's dictation and/or activities during the visit.    Electronically signed by: Ernest Mallick, OA  07.08.2019 11:05 AM   This document serves as a record of services personally performed by Gardiner Sleeper, MD, PhD. It was created on their behalf by Catha Brow, [place], a certified ophthalmic assistant. The  creation of this record is the provider's dictation and/or activities during the visit.  Electronically signed by: Catha Brow, COA  07.09.19 11:05 AM     Gardiner Sleeper, M.D., Ph.D. Diseases & Surgery of the Retina and Vitreous [facility]   I have reviewed the above documentation for accuracy and completeness, and I agree with the above. Gardiner Sleeper, M.D., Ph.D. 12/26/17 11:11 AM    Abbreviations: M myopia (nearsighted); A astigmatism; H hyperopia (farsighted); P presbyopia; Mrx spectacle prescription;  CTL contact lenses; OD right eye; OS left eye; OU both eyes  XT exotropia; ET esotropia; PEK punctate epithelial keratitis; PEE punctate epithelial erosions; DES dry eye syndrome; MGD meibomian gland dysfunction; ATs artificial tears; PFAT's preservative free artificial tears; [place] nuclear sclerotic cataract; PSC posterior subcapsular cataract; ERM epi-retinal membrane; PVD posterior vitreous detachment; RD retinal detachment; DM diabetes mellitus; DR diabetic retinopathy; NPDR non-proliferative diabetic retinopathy; PDR proliferative diabetic retinopathy; CSME clinically significant macular edema; DME diabetic macular edema; dbh dot blot hemorrhages; CWS cotton wool spot; POAG primary open angle glaucoma; C/D cup-to-disc ratio; HVF humphrey visual field; GVF goldmann visual field; OCT optical coherence tomography; IOP intraocular pressure; BRVO Branch retinal vein occlusion; CRVO central retinal vein occlusion; CRAO central retinal artery occlusion; BRAO branch retinal artery occlusion; RT retinal tear; SB scleral buckle; PPV pars plana vitrectomy; VH Vitreous hemorrhage; PRP panretinal laser photocoagulation; IVK intravitreal kenalog; VMT vitreomacular traction; MH Macular hole;  NVD neovascularization of the disc; NVE neovascularization elsewhere; AREDS age related eye disease study; ARMD age related macular degeneration; POAG primary open angle glaucoma; EBMD epithelial/anterior basement membrane dystrophy; ACIOL anterior chamber intraocular lens; IOL intraocular lens; PCIOL posterior chamber intraocular lens; Phaco/IOL phacoemulsification with intraocular lens placement; [place] photorefractive keratectomy; LASIK laser assisted in situ keratomileusis; HTN hypertension; DM diabetes mellitus; COPD chronic obstructive pulmonary disease

## 2017-12-26 ENCOUNTER — Ambulatory Visit (INDEPENDENT_AMBULATORY_CARE_PROVIDER_SITE_OTHER): Payer: Medicare HMO | Admitting: Ophthalmology

## 2017-12-26 ENCOUNTER — Encounter (INDEPENDENT_AMBULATORY_CARE_PROVIDER_SITE_OTHER): Payer: Self-pay | Admitting: Ophthalmology

## 2017-12-26 DIAGNOSIS — Z961 Presence of intraocular lens: Secondary | ICD-10-CM

## 2017-12-26 DIAGNOSIS — H3581 Retinal edema: Secondary | ICD-10-CM

## 2017-12-26 DIAGNOSIS — H35372 Puckering of macula, left eye: Secondary | ICD-10-CM | POA: Diagnosis not present

## 2017-12-26 DIAGNOSIS — H353132 Nonexudative age-related macular degeneration, bilateral, intermediate dry stage: Secondary | ICD-10-CM | POA: Diagnosis not present

## 2018-01-02 ENCOUNTER — Other Ambulatory Visit: Payer: Medicare HMO

## 2018-01-08 DIAGNOSIS — M79675 Pain in left toe(s): Secondary | ICD-10-CM | POA: Diagnosis not present

## 2018-01-08 DIAGNOSIS — M79674 Pain in right toe(s): Secondary | ICD-10-CM | POA: Diagnosis not present

## 2018-01-12 DIAGNOSIS — S8265XD Nondisplaced fracture of lateral malleolus of left fibula, subsequent encounter for closed fracture with routine healing: Secondary | ICD-10-CM | POA: Diagnosis not present

## 2018-01-12 DIAGNOSIS — M25572 Pain in left ankle and joints of left foot: Secondary | ICD-10-CM | POA: Diagnosis not present

## 2018-01-18 ENCOUNTER — Ambulatory Visit
Admission: RE | Admit: 2018-01-18 | Discharge: 2018-01-18 | Disposition: A | Discharge status: Home / Self Care | Payer: Medicare HMO | Source: Ambulatory Visit | Attending: Internal Medicine | Admitting: Internal Medicine

## 2018-01-18 ENCOUNTER — Other Ambulatory Visit: Payer: Medicare HMO

## 2018-01-18 DIAGNOSIS — R221 Localized swelling, mass and lump, neck: Secondary | ICD-10-CM | POA: Diagnosis not present

## 2018-01-25 ENCOUNTER — Encounter (HOSPITAL_COMMUNITY): Payer: Medicare HMO

## 2018-02-01 ENCOUNTER — Ambulatory Visit (HOSPITAL_COMMUNITY)
Admission: RE | Admit: 2018-02-01 | Discharge: 2018-02-01 | Disposition: A | Discharge status: Home / Self Care | Payer: Medicare HMO | Source: Ambulatory Visit | Attending: Internal Medicine | Admitting: Internal Medicine

## 2018-02-01 ENCOUNTER — Encounter (HOSPITAL_COMMUNITY): Payer: Self-pay

## 2018-02-01 DIAGNOSIS — M810 Age-related osteoporosis without current pathological fracture: Secondary | ICD-10-CM | POA: Insufficient documentation

## 2018-02-01 MED ORDER — DENOSUMAB 60 MG/ML ~~LOC~~ SOSY
60.0000 mg | PREFILLED_SYRINGE | Freq: Once | SUBCUTANEOUS | Status: AC
  Administered 2018-02-01: 60 mg via SUBCUTANEOUS
  Filled 2018-02-01: qty 1

## 2018-02-01 NOTE — Discharge Instructions (Signed)
Denosumab injection °What is this medicine? °DENOSUMAB (den oh sue mab) slows bone breakdown. Prolia is used to treat osteoporosis in women after menopause and in men. Xgeva is used to treat a high calcium level due to cancer and to prevent bone fractures and other bone problems caused by multiple myeloma or cancer bone metastases. Xgeva is also used to treat giant cell tumor of the bone. °This medicine may be used for other purposes; ask your health care provider or pharmacist if you have questions. °COMMON BRAND NAME(S): Prolia, XGEVA °What should I tell my health care provider before I take this medicine? °They need to know if you have any of these conditions: °-dental disease °-having surgery or tooth extraction °-infection °-kidney disease °-low levels of calcium or Vitamin D in the blood °-malnutrition °-on hemodialysis °-skin conditions or sensitivity °-thyroid or parathyroid disease °-an unusual reaction to denosumab, other medicines, foods, dyes, or preservatives °-pregnant or trying to get pregnant °-breast-feeding °How should I use this medicine? °This medicine is for injection under the skin. It is given by a health care professional in a hospital or clinic setting. °If you are getting Prolia, a special MedGuide will be given to you by the pharmacist with each prescription and refill. Be sure to read this information carefully each time. °For Prolia, talk to your pediatrician regarding the use of this medicine in children. Special care may be needed. For Xgeva, talk to your pediatrician regarding the use of this medicine in children. While this drug may be prescribed for children as young as 13 years for selected conditions, precautions do apply. °Overdosage: If you think you have taken too much of this medicine contact a poison control center or emergency room at once. °NOTE: This medicine is only for you. Do not share this medicine with others. °What if I miss a dose? °It is important not to miss your  dose. Call your doctor or health care professional if you are unable to keep an appointment. °What may interact with this medicine? °Do not take this medicine with any of the following medications: °-other medicines containing denosumab °This medicine may also interact with the following medications: °-medicines that lower your chance of fighting infection °-steroid medicines like prednisone or cortisone °This list may not describe all possible interactions. Give your health care provider a list of all the medicines, herbs, non-prescription drugs, or dietary supplements you use. Also tell them if you smoke, drink alcohol, or use illegal drugs. Some items may interact with your medicine. °What should I watch for while using this medicine? °Visit your doctor or health care professional for regular checks on your progress. Your doctor or health care professional may order blood tests and other tests to see how you are doing. °Call your doctor or health care professional for advice if you get a fever, chills or sore throat, or other symptoms of a cold or flu. Do not treat yourself. This drug may decrease your body's ability to fight infection. Try to avoid being around people who are sick. °You should make sure you get enough calcium and vitamin D while you are taking this medicine, unless your doctor tells you not to. Discuss the foods you eat and the vitamins you take with your health care professional. °See your dentist regularly. Brush and floss your teeth as directed. Before you have any dental work done, tell your dentist you are receiving this medicine. °Do not become pregnant while taking this medicine or for 5 months after stopping   it. Talk with your doctor or health care professional about your birth control options while taking this medicine. Women should inform their doctor if they wish to become pregnant or think they might be pregnant. There is a potential for serious side effects to an unborn child. Talk  to your health care professional or pharmacist for more information. What side effects may I notice from receiving this medicine? Side effects that you should report to your doctor or health care professional as soon as possible: -allergic reactions like skin rash, itching or hives, swelling of the face, lips, or tongue -bone pain -breathing problems -dizziness -jaw pain, especially after dental work -redness, blistering, peeling of the skin -signs and symptoms of infection like fever or chills; cough; sore throat; pain or trouble passing urine -signs of low calcium like fast heartbeat, muscle cramps or muscle pain; pain, tingling, numbness in the hands or feet; seizures -unusual bleeding or bruising -unusually weak or tired Side effects that usually do not require medical attention (report to your doctor or health care professional if they continue or are bothersome): -constipation -diarrhea -headache -joint pain -loss of appetite -muscle pain -runny nose -tiredness -upset stomach This list may not describe all possible side effects. Call your doctor for medical advice about side effects. You may report side effects to FDA at 1-800-FDA-1088. Where should I keep my medicine? This medicine is only given in a clinic, doctor's office, or other health care setting and will not be stored at home. NOTE: This sheet is a summary. It may not cover all possible information. If you have questions about this medicine, talk to your doctor, pharmacist, or health care provider.  2018 Elsevier/Gold Standard (2016-06-28 19:17:21)

## 2018-02-07 DIAGNOSIS — Z1213 Encounter for screening for malignant neoplasm of small intestine: Secondary | ICD-10-CM | POA: Diagnosis not present

## 2018-02-07 DIAGNOSIS — Z803 Family history of malignant neoplasm of breast: Secondary | ICD-10-CM | POA: Diagnosis not present

## 2018-02-12 DIAGNOSIS — M810 Age-related osteoporosis without current pathological fracture: Secondary | ICD-10-CM | POA: Diagnosis not present

## 2018-02-12 DIAGNOSIS — Z6821 Body mass index (BMI) 21.0-21.9, adult: Secondary | ICD-10-CM | POA: Diagnosis not present

## 2018-02-12 DIAGNOSIS — I10 Essential (primary) hypertension: Secondary | ICD-10-CM | POA: Diagnosis not present

## 2018-02-12 DIAGNOSIS — R82998 Other abnormal findings in urine: Secondary | ICD-10-CM | POA: Diagnosis not present

## 2018-02-14 ENCOUNTER — Telehealth: Payer: Self-pay | Admitting: Cardiology

## 2018-02-14 NOTE — Telephone Encounter (Signed)
New Message:      Pt states she is having some BP issues but didn't provide me with any of the recordings. I made the pt an appt in October and suggested a APP but pt refused.

## 2018-02-14 NOTE — Telephone Encounter (Signed)
Returned call to patient no answer.LMTC. 

## 2018-02-16 DIAGNOSIS — Z1212 Encounter for screening for malignant neoplasm of rectum: Secondary | ICD-10-CM | POA: Diagnosis not present

## 2018-02-16 NOTE — Telephone Encounter (Signed)
Left message to call back  

## 2018-02-20 NOTE — Telephone Encounter (Signed)
She has been very sensitive to medications.  I would not want to try to adjust this over the phone.  She would need to come into see an APP or our HTN clinic or next open appt for me.

## 2018-02-20 NOTE — Telephone Encounter (Signed)
Spoke with pt, her bp is fluctuating from 92/61 and 88/64 up to 165/85- 3 hours later. She is currently taking metoprolol tartrate 25 mg 1 and 1/2 tablets at bedtime only. Mentioned to the patient a better benefit would be twice daily. She reports her medical doctor said taking that medication twice daily would drop her bp too low. She has a follow up with dr hochrein in October but would like him to review this information. Will forward to dr hochrein to review and advise.

## 2018-02-21 NOTE — Telephone Encounter (Signed)
Spoke with pt, aware of dr hochrein's recommendations. She reports she is going to wear a 24 hour bp monitor with her PCP. She is going to wait for her follow up appt with dr hochrein in October.

## 2018-03-29 DIAGNOSIS — I10 Essential (primary) hypertension: Secondary | ICD-10-CM | POA: Diagnosis not present

## 2018-03-30 DIAGNOSIS — Z23 Encounter for immunization: Secondary | ICD-10-CM | POA: Diagnosis not present

## 2018-04-02 DIAGNOSIS — I10 Essential (primary) hypertension: Secondary | ICD-10-CM | POA: Diagnosis not present

## 2018-04-05 ENCOUNTER — Ambulatory Visit: Payer: Medicare HMO | Admitting: Cardiology

## 2018-05-10 DIAGNOSIS — H26492 Other secondary cataract, left eye: Secondary | ICD-10-CM | POA: Diagnosis not present

## 2018-05-10 DIAGNOSIS — H353233 Exudative age-related macular degeneration, bilateral, with inactive scar: Secondary | ICD-10-CM | POA: Diagnosis not present

## 2018-05-10 DIAGNOSIS — H02403 Unspecified ptosis of bilateral eyelids: Secondary | ICD-10-CM | POA: Diagnosis not present

## 2018-05-10 DIAGNOSIS — H47012 Ischemic optic neuropathy, left eye: Secondary | ICD-10-CM | POA: Diagnosis not present

## 2018-05-16 DIAGNOSIS — M810 Age-related osteoporosis without current pathological fracture: Secondary | ICD-10-CM | POA: Diagnosis not present

## 2018-05-16 DIAGNOSIS — M25511 Pain in right shoulder: Secondary | ICD-10-CM | POA: Diagnosis not present

## 2018-05-16 DIAGNOSIS — Z6821 Body mass index (BMI) 21.0-21.9, adult: Secondary | ICD-10-CM | POA: Diagnosis not present

## 2018-05-16 DIAGNOSIS — I10 Essential (primary) hypertension: Secondary | ICD-10-CM | POA: Diagnosis not present

## 2018-05-16 DIAGNOSIS — K589 Irritable bowel syndrome without diarrhea: Secondary | ICD-10-CM | POA: Diagnosis not present

## 2018-06-26 NOTE — Progress Notes (Signed)
[facility] Note  06/27/2018     CHIEF COMPLAINT Patient presents for Retina Follow Up   HISTORY OF PRESENT ILLNESS: Michelle Keith is a 85 y.o. female who presents to the clinic today for:   HPI    Retina Follow Up    Patient presents with  Dry AMD.  In both eyes.  This started 6 months ago.  Severity is moderate.  Duration of 6 months.  Since onset it is stable.  I, the attending physician,  performed the HPI with the patient and updated documentation appropriately.          Comments    Patient here for 6 month retinal eval follow up for non-exu ARMD OU/ERM OS. Patient states vision not quite as good. Saw Dr Bing Plume given a new RX but only slight change. Didn't feel a change in glasses was needed. Patinet getting new sunglasses.        Last edited by Bernarda Caffey, MD on 06/27/2018 10:47 AM. (History)    pt states she saw Dr. Bing Plume and he did not think she needed a change in Rx, she states he did not mention anything he was concern about, pt is happy with vision  Referring physician: [name], MD [place], [place] [address]  HISTORICAL INFORMATION:   Selected notes from the [facility] Referred by Dr. Bing Plume for evaluation of AMD OU LEE- 11.29.18 (D. Digby) [BCVA OD: 20/25 OS: 20/25] Ocular Hx- pseudophakia OU ([place]), AMD OU, S/P bil bleph, ischemic optic neuropathy OS PMH- HTN    CURRENT MEDICATIONS: No current outpatient medications on file. (Ophthalmic Drugs)   No current facility-administered medications for this visit.  (Ophthalmic Drugs)   Current Outpatient Medications (Other)  Medication Sig  . cholecalciferol (VITAMIN D) 1000 UNITS tablet Take 1,000 Units by mouth daily.   . cholecalciferol (VITAMIN D) 1000 units tablet Vitamin D  . meclizine (ANTIVERT) 12.5 MG tablet meclizine 12.5 mg tablet  Take 2 tablets 3 times a day by oral route.  . meclizine (ANTIVERT) 25 MG tablet Take 25 mg by mouth 2 (two) times  daily as needed for dizziness or nausea.  . metoprolol tartrate (LOPRESSOR) 25 MG tablet Pt takes 3/4 tablet by mouth daily  . mirtazapine (REMERON) 15 MG tablet Take 3.75 mg by mouth at bedtime.   . Multiple Vitamins-Minerals (PRESERVISION AREDS 2 PO) Take by mouth.  . Multiple Vitamins-Minerals (PRESERVISION AREDS 2+MULTI VIT PO) PreserVision AREDS  . pantoprazole (PROTONIX) 20 MG tablet Protonix  . pantoprazole (PROTONIX) 40 MG tablet Take 40 mg by mouth daily as needed (heartburn).   . Probiotic Product (PROBIOTIC DAILY PO) Take 1 capsule by mouth daily.   . Probiotic Product (PROBIOTIC-10 PO) Probiotic  . raloxifene (EVISTA) 60 MG tablet Take 60 mg by mouth daily.  . rifaximin (XIFAXAN) 550 MG TABS tablet Take 1 tablet (550 mg total) by mouth 3 (three) times daily.  Marland Kitchen zolpidem (AMBIEN) 10 MG tablet Take 2.5 mg by mouth daily as needed for sleep.   Marland Kitchen zolpidem (AMBIEN) 10 MG tablet Ambien 10 mg tablet  Take 1 tablet every day by oral route.   No current facility-administered medications for this visit.  (Other)      REVIEW OF SYSTEMS: ROS    Positive for: Cardiovascular, Eyes   Negative for: Constitutional, Gastrointestinal, Neurological, Skin, Genitourinary, Musculoskeletal, HENT, Endocrine, Respiratory, Psychiatric, Allergic/Imm, Heme/Lymph   Last edited by Bernarda Caffey, MD on 06/27/2018 10:47 AM. (  History)       ALLERGIES Allergies  Allergen Reactions  . Epinephrine Anaphylaxis  . Lidocaine Anaphylaxis  . Aspirin   . Atenolol   . Clarithromycin   . Cortisone Other (See Comments)  . Omeprazole Magnesium Other (See Comments)    Ask at office visit  . Penicillins   . Pindolol Diarrhea    Fatigue, hip pain  . Pseudoephedrine-Guaifenesin Other (See Comments)  . Sulfonamide Derivatives   . Tetracycline Hives  . Glycol Stearate Palpitations  . Valsartan Palpitations    PAST MEDICAL HISTORY Past Medical History:  Diagnosis Date  . Dysautonomia ([place])   . Esophageal  stricture   . GERD (gastroesophageal reflux disease)   . Hiatal hernia   . IBS (irritable bowel syndrome)   . Osteopenia   . Right thyroid nodule   . Squamous cell skin cancer   . Syncope    Past Surgical History:  Procedure Laterality Date  . C-EYE SURGERY PROCEDURE     30 yrs ago  . Cataract extraction    . CATARACT EXTRACTION  2001  . COLONOSCOPY  2009   Also 2002  . ESOPHAGEAL DILATION     x 4  . ESOPHAGEAL MANOMETRY    . ESOPHAGOGASTRODUODENOSCOPY  Multiple  . Exploratory laparoscopy    . EYE SURGERY    . NISSEN FUNDOPLICATION    . TOTAL ABDOMINAL HYSTERECTOMY      FAMILY HISTORY Family History  Problem Relation Age of Onset  . Multiple myeloma Father        died age 2  . Heart failure Mother        died age 24    SOCIAL HISTORY Social History   Tobacco Use  . Smoking status: Never Smoker  . Smokeless tobacco: Never Used  Substance Use Topics  . Alcohol use: No    Alcohol/week: 0.0 standard drinks  . Drug use: No         OPHTHALMIC EXAM:  Base Eye Exam    Visual Acuity (Snellen - Linear)      Right Left   Dist cc 20/20 -2 20/30 -2   Dist ph cc  NI       Tonometry (Tonopen, 10:10 AM)      Right Left   Pressure 16 14       Pupils      Dark Light Shape React APD   Right 4 2 Round Brisk None   Left 4 2 Round Brisk None       Visual Fields (Counting fingers)      Left Right    Full Full       Extraocular Movement      Right Left    Full, Ortho Full, Ortho       Neuro/Psych    Oriented x3:  Yes   Mood/Affect:  Normal       Dilation    Both eyes:  1.0% Mydriacyl, 2.5% Phenylephrine @ 10:10 AM        Slit Lamp and Fundus Exam    External Exam      Right Left   External Normal Normal       Slit Lamp Exam      Right Left   Lids/Lashes Dermatochalasis - upper lid, Meibomian gland dysfunction Dermatochalasis - upper lid   Conjunctiva/Sclera White and quiet White and quiet   Cornea arcus, Inferior 1-2+ peripheral Punctate  epithelial erosions, Anterior basement membrane dystrophy arcus; ABMD, 3+ peripheral Punctate epithelial erosions,  mild peripheral corneal haze   Anterior Chamber Deep and quiet Deep and quiet   Iris Round, dilated Round, dilated   Lens PCIOL; open PC PCIOL; tr PCO non central   Vitreous Vitreous syneresis, PVD Vitreous syneresis, PVD       Fundus Exam      Right Left   Disc Normal; compact Normal; compact   C/D Ratio 0.1 0.1   Macula Blunted foveal reflex, RPE mottling and clumping; drusen; no heme or edema Blunted foveal reflex, ERM; drusen; no heme or edema   Vessels attenuated attenuated   Periphery Attached, cobblestoning at 0700, mild Reticular degeneration attached        Refraction    Wearing Rx      Sphere Cylinder Axis   Right +0.25 Sphere    Left -1.00 +0.50 152          IMAGING AND PROCEDURES  Imaging and Procedures for 08/29/17  OCT, Retina - OU - Both Eyes       Right Eye Quality was good. Central Foveal Thickness: 340. Progression has been stable. Findings include no IRF, no SRF, retinal drusen , abnormal foveal contour (Blunted foveal depression).   Left Eye Quality was good. Central Foveal Thickness: 393. Progression has been stable. Findings include no IRF, no SRF, abnormal foveal contour, retinal drusen , epiretinal membrane.   Notes Images taken, stored on drive  Diagnosis / Impression:  nonexudative ARMD OU ERM OS  Clinical management:  See below  Abbreviations: NFP - Normal foveal profile. CME - cystoid macular edema. PED - pigment epithelial detachment. IRF - intraretinal fluid. SRF - subretinal fluid. EZ - ellipsoid zone. ERM - epiretinal membrane. ORA - outer retinal atrophy. ORT - outer retinal tubulation. SRHM - subretinal hyper-reflective material                  ASSESSMENT/PLAN:    ICD-10-CM   1. Intermediate stage nonexudative age-related macular degeneration of both eyes H35.3132   2. Epiretinal membrane (ERM) of left  eye H35.372   3. Pseudophakia of both eyes Z96.1   4. Retinal edema H35.81 OCT, Retina - OU - Both Eyes    1. Age related macular degeneration, non-exudative, both eyes  - mild-intermediate stage OU -- stable  - The incidence, anatomy, and pathology of dry AMD, risk of progression, and the AREDS and AREDS 2 study including smoking risks discussed with patient.  - continue amsler grid monitoring  - f/u 6-9 months, sooner prn  2. Epiretinal membrane, left eye  The natural history, anatomy, potential for loss of vision, and treatment options including vitrectomy techniques and the complications of endophthalmitis, retinal detachment, vitreous hemorrhage, cataract progression and permanent vision loss discussed with the patient. - very mild ERM, BCVA 20/30 with minimal metamorphopsia OS - OCT and exam stable from prior - no surgical intervention indicated at this time - monitor - f/u in 6-9 mos  3. Pseudophakia OU  - s/p CE/IOL OU w/ Dr. Herbert Deaner  - beautiful surgeries, doing well  - monitor   Ophthalmic Meds Ordered this visit:  No orders of the defined types were placed in this encounter.      Return for f/u 6-9 months, non-exu ARMD OU, DFE, OCT.  There are no Patient Instructions on file for this visit.   This document serves as a record of services personally performed by Gardiner Sleeper, MD, PhD. It was created on their behalf by Ernest Mallick, OA, an ophthalmic assistant. The creation of  this record is the provider's dictation and/or activities during the visit.    Electronically signed by: Ernest Mallick, OA  01.07.2020 12:29 PM    Gardiner Sleeper, M.D., Ph.D. Diseases & Surgery of the Retina and Vitreous [facility]  I have reviewed the above documentation for accuracy and completeness, and I agree with the above. Gardiner Sleeper, M.D., Ph.D. 06/27/18 12:29 PM   Abbreviations: M myopia (nearsighted); A astigmatism; H hyperopia (farsighted); P  presbyopia; Mrx spectacle prescription;  CTL contact lenses; OD right eye; OS left eye; OU both eyes  XT exotropia; ET esotropia; PEK punctate epithelial keratitis; PEE punctate epithelial erosions; DES dry eye syndrome; MGD meibomian gland dysfunction; ATs artificial tears; PFAT's preservative free artificial tears; Hardy nuclear sclerotic cataract; PSC posterior subcapsular cataract; ERM epi-retinal membrane; PVD posterior vitreous detachment; RD retinal detachment; DM diabetes mellitus; DR diabetic retinopathy; NPDR non-proliferative diabetic retinopathy; PDR proliferative diabetic retinopathy; CSME clinically significant macular edema; DME diabetic macular edema; dbh dot blot hemorrhages; CWS cotton wool spot; POAG primary open angle glaucoma; C/D cup-to-disc ratio; HVF humphrey visual field; GVF goldmann visual field; OCT optical coherence tomography; IOP intraocular pressure; BRVO Branch retinal vein occlusion; CRVO central retinal vein occlusion; CRAO central retinal artery occlusion; BRAO branch retinal artery occlusion; RT retinal tear; SB scleral buckle; PPV pars plana vitrectomy; VH Vitreous hemorrhage; PRP panretinal laser photocoagulation; IVK intravitreal kenalog; VMT vitreomacular traction; MH Macular hole;  NVD neovascularization of the disc; NVE neovascularization elsewhere; AREDS age related eye disease study; ARMD age related macular degeneration; POAG primary open angle glaucoma; EBMD epithelial/anterior basement membrane dystrophy; ACIOL anterior chamber intraocular lens; IOL intraocular lens; PCIOL posterior chamber intraocular lens; Phaco/IOL phacoemulsification with intraocular lens placement; [place] photorefractive keratectomy; LASIK laser assisted in situ keratomileusis; HTN hypertension; DM diabetes mellitus; COPD chronic obstructive pulmonary disease

## 2018-06-27 ENCOUNTER — Ambulatory Visit (INDEPENDENT_AMBULATORY_CARE_PROVIDER_SITE_OTHER): Payer: Medicare HMO | Admitting: Ophthalmology

## 2018-06-27 ENCOUNTER — Encounter (INDEPENDENT_AMBULATORY_CARE_PROVIDER_SITE_OTHER): Payer: Self-pay | Admitting: Ophthalmology

## 2018-06-27 DIAGNOSIS — H3581 Retinal edema: Secondary | ICD-10-CM | POA: Diagnosis not present

## 2018-06-27 DIAGNOSIS — H35372 Puckering of macula, left eye: Secondary | ICD-10-CM

## 2018-06-27 DIAGNOSIS — H353132 Nonexudative age-related macular degeneration, bilateral, intermediate dry stage: Secondary | ICD-10-CM | POA: Diagnosis not present

## 2018-06-27 DIAGNOSIS — Z961 Presence of intraocular lens: Secondary | ICD-10-CM

## 2018-06-29 DIAGNOSIS — Z0100 Encounter for examination of eyes and vision without abnormal findings: Secondary | ICD-10-CM | POA: Diagnosis not present

## 2018-07-03 DIAGNOSIS — R69 Illness, unspecified: Secondary | ICD-10-CM | POA: Diagnosis not present

## 2018-07-10 DIAGNOSIS — L570 Actinic keratosis: Secondary | ICD-10-CM | POA: Diagnosis not present

## 2018-07-18 NOTE — Progress Notes (Signed)
Cardiology Office Note   Date:  07/19/2018   ID:  Raeley, Gilmore [birth year], MRN [id number]  PCP:  [name], MD Cardiologist:   Minus Breeding, MD   Chief Complaint  Patient presents with  . Palpitations     History of Present Illness: Michelle Keith is a 85 y.o. female who presents for follow up of chest pain. She did have a history of syncope. She's had a workup that has included a negative stress perfusion study in 2016. Of note there is mention of a possible VSD in her primary care notes. However, I reviewed the echo results in 2007 and don't see mention of this. She has  had palpitations and has been intolerant of calcium channel blockers and beta blockers.     She wore a Holter in  2017.  There were no significant arrhythmias.  Since the last visit she has had many calls with weakness and fluctuating BPs and she did wear an event monitor.  There were no arrhythmias.      She has worn a 24-hour blood pressure monitor she reports and she said that she was told the blood pressure was actually controlled.  She brings in an extensive diary.  She takes it 5-7 times per day.  It tends to average probably above 140/90.  She has some readings of 170s over 90s.  However, she also has some low blood pressures.  She has not tolerated any med titration other than the slight beta-blockers.  She is not having the palpitations that she was having previously.  She said these have calm down dramatically.  She is not having any new shortness of breath, PND or orthopnea.  She is not having any chest pressure, neck or arm discomfort.   Past Medical History:  Diagnosis Date  . Dysautonomia ([place])   . Esophageal stricture   . GERD (gastroesophageal reflux disease)   . Hiatal hernia   . IBS (irritable bowel syndrome)   . Osteopenia   . Right thyroid nodule   . Squamous cell skin cancer   . Syncope     Past Surgical History:  Procedure Laterality Date  . C-EYE SURGERY PROCEDURE     30 yrs  ago  . Cataract extraction    . CATARACT EXTRACTION  2001  . COLONOSCOPY  2009   Also 2002  . ESOPHAGEAL DILATION     x 4  . ESOPHAGEAL MANOMETRY    . ESOPHAGOGASTRODUODENOSCOPY  Multiple  . Exploratory laparoscopy    . EYE SURGERY    . NISSEN FUNDOPLICATION    . TOTAL ABDOMINAL HYSTERECTOMY       Current Outpatient Medications  Medication Sig Dispense Refill  . cholecalciferol (VITAMIN D) 1000 UNITS tablet Take 1,000 Units by mouth daily.     . cholecalciferol (VITAMIN D) 1000 units tablet Vitamin D    . denosumab (PROLIA) 60 MG/ML SOSY injection Inject 60 mg into the skin once. 2 times a year    . metoprolol tartrate (LOPRESSOR) 25 MG tablet Pt takes 3/4 tablet by mouth daily    . mirtazapine (REMERON) 15 MG tablet Take 3.75 mg by mouth at bedtime.     . Multiple Vitamins-Minerals (PRESERVISION AREDS 2 PO) Take by mouth.    . Multiple Vitamins-Minerals (PRESERVISION AREDS 2+MULTI VIT PO) PreserVision AREDS    . pantoprazole (PROTONIX) 40 MG tablet Take 40 mg by mouth daily as needed (heartburn).     . Probiotic Product (PROBIOTIC DAILY PO)  Take 1 capsule by mouth daily.     . Probiotic Product (PROBIOTIC-10 PO) Probiotic    . raloxifene (EVISTA) 60 MG tablet Take 60 mg by mouth daily.    Marland Kitchen zolpidem (AMBIEN) 10 MG tablet Take 2.5 mg by mouth daily as needed for sleep.      No current facility-administered medications for this visit.     Allergies:   Epinephrine; Lidocaine; Aspirin; Atenolol; Clarithromycin; Cortisone; Omeprazole magnesium; Penicillins; Pindolol; Pseudoephedrine-guaifenesin; Sulfonamide derivatives; Tetracycline; Glycol stearate; and Valsartan    ROS:  Please see the history of present illness.   Otherwise, review of systems are positive for none   All other systems are reviewed and negative.    PHYSICAL EXAM: VS:  BP 134/84   Pulse 65   Ht 5\' 5"  (1.651 m)   Wt 124 lb 9.6 oz (56.5 kg)   BMI 20.73 kg/m  , BMI Body mass index is 20.73 kg/m.  GENERAL:   Well appearing NECK:  No jugular venous distention, waveform within normal limits, carotid upstroke brisk and symmetric, no bruits, no thyromegaly LUNGS:  Clear to auscultation bilaterally CHEST:  Unremarkable HEART:  PMI not displaced or sustained,S1 and S2 within normal limits, no S3, no S4, no clicks, no rubs, no murmurs ABD:  Flat, positive bowel sounds normal in frequency in pitch, no bruits, no rebound, no guarding, no midline pulsatile mass, no hepatomegaly, no splenomegaly EXT:  2 plus pulses throughout, no edema, no cyanosis no clubbing   EKG:  EKG is  ordered today. Sinus rhythm, rate 65, axis within normal limits, intervals within normal limits, no acute ST-T wave changes.  Recent Labs: 07/20/2017: Hemoglobin 13.4; Platelets 356    Lipid Panel No results found for: CHOL, TRIG, HDL, CHOLHDL, VLDL, LDLCALC, LDLDIRECT    Wt Readings from Last 3 Encounters:  07/19/18 124 lb 9.6 oz (56.5 kg)  07/17/17 128 lb (58.1 kg)  02/13/17 126 lb (57.2 kg)    Lab Results  Component Value Date   TSH 3.05 01/13/2016     Other studies Reviewed: Additional studies/ records that were reviewed today include: None Review of the above records demonstrates:       ASSESSMENT AND PLAN:   PALPITATIONS:    She had no arrhythmias on her monitor.  She has had no further significant symptoms.  No change in therapy.  HTN: She has been very sensitive to medications.  Even though her blood pressure runs slightly above target on average I think that she is too sensitive to medications to adjust further.  She has ways of bringing it down such as reading the Bible and going for a walk.  I think this is very reasonable.  We talked about maybe taking the blood pressure less frequently but she feels very comfortable taking it frequently.  Therefore, no change in therapy.  I will get the results of the 24-hour monitor to review.     Current medicines are reviewed at length with the patient today.  The  patient does not have concerns regarding medicines.  The following changes have been made:   none  Labs/ tests ordered today include:    None  Orders Placed This Encounter  Procedures  . EKG 12-Lead     Disposition:   FU with me in 12 months.     Signed, Minus Breeding, MD  07/19/2018 10:54 AM    [facility]

## 2018-07-19 ENCOUNTER — Ambulatory Visit: Payer: Medicare HMO | Admitting: Cardiology

## 2018-07-19 ENCOUNTER — Encounter: Payer: Self-pay | Admitting: Cardiology

## 2018-07-19 ENCOUNTER — Encounter

## 2018-07-19 VITALS — BP 134/84 | HR 65 | Ht 65.0 in | Wt 124.6 lb

## 2018-07-19 DIAGNOSIS — R002 Palpitations: Secondary | ICD-10-CM | POA: Diagnosis not present

## 2018-07-19 DIAGNOSIS — I10 Essential (primary) hypertension: Secondary | ICD-10-CM

## 2018-07-19 NOTE — Patient Instructions (Signed)
Medication Instructions:  Continue current medications  If you need a refill on your cardiac medications before your next appointment, please call your pharmacy.  Labwork: None Ordered   Take the provided lab slips with you to the lab for your blood draw.   When you have your labs (blood work) drawn today and your tests are completely normal, you will receive your results only by MyChart Message (if you have MyChart) -OR-  A paper copy in the mail.  If you have any lab test that is abnormal or we need to change your treatment, we will call you to review these results.  Testing/Procedures: None Ordered  Follow-Up: You will need a follow up appointment in 12 months.  Please call our office 2 months in advance to schedule this appointment.  You may see Dr Hochrein or one of the following Advanced Practice Providers on your designated Care Team:   Rhonda Barrett, PA-C . Kathryn Lawrence, DNP, ANP    At [facility], you and your health needs are our priority.  As part of our continuing mission to provide you with exceptional heart care, we have created designated Provider Care Teams.  These Care Teams include your primary Cardiologist (physician) and Advanced Practice Providers (APPs -  Physician Assistants and Nurse Practitioners) who all work together to provide you with the care you need, when you need it.  Thank you for choosing [facility] at [facility]!!     

## 2018-07-31 DIAGNOSIS — Z124 Encounter for screening for malignant neoplasm of cervix: Secondary | ICD-10-CM | POA: Diagnosis not present

## 2018-07-31 DIAGNOSIS — R87610 Atypical squamous cells of undetermined significance on cytologic smear of cervix (ASC-US): Secondary | ICD-10-CM | POA: Diagnosis not present

## 2018-07-31 DIAGNOSIS — Z01419 Encounter for gynecological examination (general) (routine) without abnormal findings: Secondary | ICD-10-CM | POA: Diagnosis not present

## 2018-08-09 ENCOUNTER — Encounter (HOSPITAL_COMMUNITY): Payer: Self-pay

## 2018-08-09 ENCOUNTER — Ambulatory Visit (HOSPITAL_COMMUNITY)
Admission: RE | Admit: 2018-08-09 | Discharge: 2018-08-09 | Disposition: A | Discharge status: Home / Self Care | Payer: Medicare HMO | Source: Ambulatory Visit | Attending: Internal Medicine | Admitting: Internal Medicine

## 2018-08-09 ENCOUNTER — Encounter (INDEPENDENT_AMBULATORY_CARE_PROVIDER_SITE_OTHER): Payer: Self-pay

## 2018-08-09 DIAGNOSIS — M810 Age-related osteoporosis without current pathological fracture: Secondary | ICD-10-CM | POA: Diagnosis not present

## 2018-08-09 MED ORDER — DENOSUMAB 60 MG/ML ~~LOC~~ SOSY
60.0000 mg | PREFILLED_SYRINGE | Freq: Once | SUBCUTANEOUS | Status: AC
  Administered 2018-08-09: 60 mg via SUBCUTANEOUS
  Filled 2018-08-09: qty 1

## 2018-08-09 NOTE — Discharge Instructions (Signed)
Denosumab injection °What is this medicine? °DENOSUMAB (den oh sue mab) slows bone breakdown. Prolia is used to treat osteoporosis in women after menopause and in men, and in people who are taking corticosteroids for 6 months or more. Xgeva is used to treat a high calcium level due to cancer and to prevent bone fractures and other bone problems caused by multiple myeloma or cancer bone metastases. Xgeva is also used to treat giant cell tumor of the bone. °This medicine may be used for other purposes; ask your health care provider or pharmacist if you have questions. °COMMON BRAND NAME(S): Prolia, XGEVA °What should I tell my health care provider before I take this medicine? °They need to know if you have any of these conditions: °-dental disease °-having surgery or tooth extraction °-infection °-kidney disease °-low levels of calcium or Vitamin D in the blood °-malnutrition °-on hemodialysis °-skin conditions or sensitivity °-thyroid or parathyroid disease °-an unusual reaction to denosumab, other medicines, foods, dyes, or preservatives °-pregnant or trying to get pregnant °-breast-feeding °How should I use this medicine? °This medicine is for injection under the skin. It is given by a health care professional in a hospital or clinic setting. °A special MedGuide will be given to you before each treatment. Be sure to read this information carefully each time. °For Prolia, talk to your pediatrician regarding the use of this medicine in children. Special care may be needed. For Xgeva, talk to your pediatrician regarding the use of this medicine in children. While this drug may be prescribed for children as young as 13 years for selected conditions, precautions do apply. °Overdosage: If you think you have taken too much of this medicine contact a poison control center or emergency room at once. °NOTE: This medicine is only for you. Do not share this medicine with others. °What if I miss a dose? °It is important not to  miss your dose. Call your doctor or health care professional if you are unable to keep an appointment. °What may interact with this medicine? °Do not take this medicine with any of the following medications: °-other medicines containing denosumab °This medicine may also interact with the following medications: °-medicines that lower your chance of fighting infection °-steroid medicines like prednisone or cortisone °This list may not describe all possible interactions. Give your health care provider a list of all the medicines, herbs, non-prescription drugs, or dietary supplements you use. Also tell them if you smoke, drink alcohol, or use illegal drugs. Some items may interact with your medicine. °What should I watch for while using this medicine? °Visit your doctor or health care professional for regular checks on your progress. Your doctor or health care professional may order blood tests and other tests to see how you are doing. °Call your doctor or health care professional for advice if you get a fever, chills or sore throat, or other symptoms of a cold or flu. Do not treat yourself. This drug may decrease your body's ability to fight infection. Try to avoid being around people who are sick. °You should make sure you get enough calcium and vitamin D while you are taking this medicine, unless your doctor tells you not to. Discuss the foods you eat and the vitamins you take with your health care professional. °See your dentist regularly. Brush and floss your teeth as directed. Before you have any dental work done, tell your dentist you are receiving this medicine. °Do not become pregnant while taking this medicine or for 5 months   after stopping it. Talk with your doctor or health care professional about your birth control options while taking this medicine. Women should inform their doctor if they wish to become pregnant or think they might be pregnant. There is a potential for serious side effects to an unborn  child. Talk to your health care professional or pharmacist for more information. What side effects may I notice from receiving this medicine? Side effects that you should report to your doctor or health care professional as soon as possible: -allergic reactions like skin rash, itching or hives, swelling of the face, lips, or tongue -bone pain -breathing problems -dizziness -jaw pain, especially after dental work -redness, blistering, peeling of the skin -signs and symptoms of infection like fever or chills; cough; sore throat; pain or trouble passing urine -signs of low calcium like fast heartbeat, muscle cramps or muscle pain; pain, tingling, numbness in the hands or feet; seizures -unusual bleeding or bruising -unusually weak or tired Side effects that usually do not require medical attention (report to your doctor or health care professional if they continue or are bothersome): -constipation -diarrhea -headache -joint pain -loss of appetite -muscle pain -runny nose -tiredness -upset stomach This list may not describe all possible side effects. Call your doctor for medical advice about side effects. You may report side effects to FDA at 1-800-FDA-1088. Where should I keep my medicine? This medicine is only given in a clinic, doctor's office, or other health care setting and will not be stored at home. NOTE: This sheet is a summary. It may not cover all possible information. If you have questions about this medicine, talk to your doctor, pharmacist, or health care provider.  2019 Elsevier/Gold Standard (2017-10-13 16:10:44)

## 2018-10-09 DIAGNOSIS — H02403 Unspecified ptosis of bilateral eyelids: Secondary | ICD-10-CM | POA: Diagnosis not present

## 2018-10-09 DIAGNOSIS — B301 Conjunctivitis due to adenovirus: Secondary | ICD-10-CM | POA: Diagnosis not present

## 2018-10-23 NOTE — Progress Notes (Signed)
[facility] Note  10/24/2018     CHIEF COMPLAINT Patient presents for Retina Follow Up   HISTORY OF PRESENT ILLNESS: Michelle Keith is a 85 y.o. female who presents to the clinic today for:   HPI    Retina Follow Up    Patient presents with  Dry AMD.  In both eyes.  This started 4 months ago.  Severity is moderate.  Duration of 4 months.  I, the attending physician,  performed the HPI with the patient and updated documentation appropriately.          Comments    Patient here for retina follow up for change/decrease in vision. Last seen 06-27-18 for non-exu ARMD. Patient states vision not quite as well in OD. Had been able to read captions, now more difficult to read. Eyes sting. No sharp pains.        Last edited by Bernarda Caffey, MD on 10/24/2018  2:52 PM. (History)    pt states she feels like she has not been seeing as well out of her right eye, she states 3 weeks ago she had an eye infection and got a bottle of eye drops from Dr. Bing Plume, she states her eye was swollen and red, but states the redness went away, pt states her eye has been burning/stinging, she states she is using Thera tears, which seem to help   Referring physician: [name], MD [place], [place] [id number]  HISTORICAL INFORMATION:   Selected notes from the MEDICAL RECORD NUMBER Referred by Dr. Bing Plume for evaluation of AMD OU LEE- 11.29.18 (D. Digby) [BCVA OD: 20/25 OS: 20/25] Ocular Hx- pseudophakia OU ([place]), AMD OU, S/P bil bleph, ischemic optic neuropathy OS PMH- HTN    CURRENT MEDICATIONS: No current outpatient medications on file. (Ophthalmic Drugs)   No current facility-administered medications for this visit.  (Ophthalmic Drugs)   Current Outpatient Medications (Other)  Medication Sig  . cholecalciferol (VITAMIN D) 1000 UNITS tablet Take 2,000 Units by mouth daily.   . cholecalciferol (VITAMIN D) 1000 units tablet Vitamin D  . denosumab (PROLIA) 60 MG/ML  SOSY injection Inject 60 mg into the skin once. 2 times a year  . metoprolol tartrate (LOPRESSOR) 25 MG tablet Pt takes 1 1/2 tablet by mouth daily  . mirtazapine (REMERON) 15 MG tablet Take 3.75 mg by mouth at bedtime.   . Multiple Vitamins-Minerals (PRESERVISION AREDS 2 PO) Take by mouth.  . Multiple Vitamins-Minerals (PRESERVISION AREDS 2+MULTI VIT PO) PreserVision AREDS  . pantoprazole (PROTONIX) 40 MG tablet Take 40 mg by mouth daily as needed (heartburn).   . Probiotic Product (PROBIOTIC DAILY PO) Take 1 capsule by mouth daily.   . Probiotic Product (PROBIOTIC-10 PO) Probiotic  . raloxifene (EVISTA) 60 MG tablet Take 60 mg by mouth daily.  Marland Kitchen zolpidem (AMBIEN) 10 MG tablet Take 2.5 mg by mouth daily as needed for sleep.    No current facility-administered medications for this visit.  (Other)      REVIEW OF SYSTEMS: ROS    Positive for: Cardiovascular, Eyes   Negative for: Constitutional, Gastrointestinal, Neurological, Skin, Genitourinary, Musculoskeletal, HENT, Endocrine, Respiratory, Psychiatric, Allergic/Imm, Heme/Lymph   Last edited by Theodore Demark on 10/24/2018  2:25 PM. (History)       ALLERGIES Allergies  Allergen Reactions  . Epinephrine Anaphylaxis  . Lidocaine Anaphylaxis  . Aspirin   . Atenolol   . Clarithromycin   . Cortisone Other (See Comments)  . Omeprazole Magnesium  Other (See Comments)    Ask at office visit  . Penicillins   . Pindolol Diarrhea    Fatigue, hip pain  . Pseudoephedrine-Guaifenesin Other (See Comments)  . Sulfonamide Derivatives   . Tetracycline Hives  . Glycol Stearate Palpitations  . Valsartan Palpitations    PAST MEDICAL HISTORY Past Medical History:  Diagnosis Date  . Dysautonomia ([place])   . Esophageal stricture   . GERD (gastroesophageal reflux disease)   . Hiatal hernia   . IBS (irritable bowel syndrome)   . Osteopenia   . Right thyroid nodule   . Squamous cell skin cancer   . Syncope    Past Surgical History:   Procedure Laterality Date  . C-EYE SURGERY PROCEDURE     30 yrs ago  . Cataract extraction    . CATARACT EXTRACTION  2001  . COLONOSCOPY  2009   Also 2002  . ESOPHAGEAL DILATION     x 4  . ESOPHAGEAL MANOMETRY    . ESOPHAGOGASTRODUODENOSCOPY  Multiple  . Exploratory laparoscopy    . EYE SURGERY    . NISSEN FUNDOPLICATION    . TOTAL ABDOMINAL HYSTERECTOMY      FAMILY HISTORY Family History  Problem Relation Age of Onset  . Multiple myeloma Father        died age 25  . Heart failure Mother        died age 40    SOCIAL HISTORY Social History   Tobacco Use  . Smoking status: Never Smoker  . Smokeless tobacco: Never Used  Substance Use Topics  . Alcohol use: No    Alcohol/week: 0.0 standard drinks  . Drug use: No         OPHTHALMIC EXAM:  Base Eye Exam    Visual Acuity (Snellen - Linear)      Right Left   Dist [place] 20/20 -2 20/30   Dist ph [place]  20/25       Tonometry (Tonopen, 2:21 PM)      Right Left   Pressure 12 11       Pupils      Dark Light Shape React APD   Right 4 2 Round Brisk None   Left 4 2 Round Brisk None       Visual Fields (Counting fingers)      Left Right    Full Full       Extraocular Movement      Right Left    Full, Ortho Full, Ortho       Neuro/Psych    Oriented x3:  Yes   Mood/Affect:  Normal       Dilation    Both eyes:  1.0% Mydriacyl, 2.5% Phenylephrine @ 2:21 PM        Slit Lamp and Fundus Exam    External Exam      Right Left   External Normal Normal       Slit Lamp Exam      Right Left   Lids/Lashes Dermatochalasis - upper lid, Meibomian gland dysfunction Dermatochalasis - upper lid   Conjunctiva/Sclera White and quiet White and quiet   Cornea arcus, Inferior 2-3+ peripheral Punctate epithelial erosions, Anterior basement membrane dystrophy, Debris in tear film arcus; ABMD, 3+ peripheral Punctate epithelial erosions, mild peripheral corneal haze   Anterior Chamber Deep and quiet Deep and quiet   Iris  Round, dilated Round, dilated   Lens PCIOL; open PC PCIOL; tr PCO non central   Vitreous Vitreous syneresis, PVD, vitreous  condensations Vitreous syneresis, PVD, vitreous condensations       Fundus Exam      Right Left   Disc ; compact, trace Peripapillary atrophy inferiorly Normal; compact   C/D Ratio 0.1 0.1   Macula Blunted foveal reflex, RPE mottling and clumping; drusen; no heme or edema flat, Blunted foveal reflex, RPE mottling and clumping, ERM; drusen; no heme or edema   Vessels attenuated attenuated   Periphery Attached, cobblestoning at 0700, mild Reticular degeneration attached        Refraction    Wearing Rx      Sphere Cylinder Axis   Right +0.25 Sphere    Left -1.00 +0.50 152          IMAGING AND PROCEDURES  Imaging and Procedures for 08/29/17  OCT, Retina - OU - Both Eyes       Right Eye Quality was good. Central Foveal Thickness: 339. Progression has been stable. Findings include no IRF, no SRF, retinal drusen , abnormal foveal contour (Mild Interval increase in drusen).   Left Eye Quality was good. Central Foveal Thickness: 398. Progression has been stable. Findings include no IRF, no SRF, abnormal foveal contour, retinal drusen , epiretinal membrane.   Notes Images taken, stored on drive  Diagnosis / Impression:  nonexudative ARMD OU -- mild interval increase in drusen ERM OS  Clinical management:  See below  Abbreviations: NFP - Normal foveal profile. CME - cystoid macular edema. PED - pigment epithelial detachment. IRF - intraretinal fluid. SRF - subretinal fluid. EZ - ellipsoid zone. ERM - epiretinal membrane. ORA - outer retinal atrophy. ORT - outer retinal tubulation. SRHM - subretinal hyper-reflective material                  ASSESSMENT/PLAN:    ICD-10-CM   1. Intermediate stage nonexudative age-related macular degeneration of both eyes H35.3132   2. Retinal edema H35.81 OCT, Retina - OU - Both Eyes  3. Epiretinal membrane (ERM)  of left eye H35.372   4. Pseudophakia of both eyes Z96.1   5. Dry eyes H04.123     5. Dry Eyes OU - early f/u due to intermittent irritation and decreased vision OD - BCVA remains 20/20 OD, 20/25 OS - more symptomatic - exam shows persistent PEE (2-3+), ABMD, decreased TBUT and tear film debris - recommend hot compresses OU BID - recommend artificial tears and lubricating ointment as needed  1,2. Age related macular degeneration, non-exudative, both eyes  - mild-intermediate stage OU -- mild interval increase in drusen noted on OCT  - The incidence, anatomy, and pathology of dry AMD, risk of progression, and the AREDS and AREDS 2 study including smoking risks discussed with patient.  - continue amsler grid monitoring  - f/u 6 months, sooner prn  3. Epiretinal membrane, left eye   - very mild ERM, BCVA 20/25 with minimal metamorphopsia OS  - OCT and exam stable from prior  - no surgical intervention indicated at this time  - monitor  - f/u in 6 mos  4. Pseudophakia OU  - s/p CE/IOL OU w/ Dr. Herbert Deaner  - beautiful surgeries, doing well  - monitor   Ophthalmic Meds Ordered this visit:  No orders of the defined types were placed in this encounter.      Return in about 6 months (around 04/26/2019) for f/u non-exu ARMD OU, DFE, OCT.  There are no Patient Instructions on file for this visit.   This document serves as a record of services  personally performed by Gardiner Sleeper, MD, PhD. It was created on their behalf by Ernest Mallick, OA, an ophthalmic assistant. The creation of this record is the provider's dictation and/or activities during the visit.    Electronically signed by: Ernest Mallick, OA  05.05.2020 3:29 PM      Gardiner Sleeper, M.D., Ph.D. Diseases & Surgery of the Retina and Vitreous [facility]  I have reviewed the above documentation for accuracy and completeness, and I agree with the above. Gardiner Sleeper, M.D., Ph.D. 10/24/18 3:30  PM    Abbreviations: M myopia (nearsighted); A astigmatism; H hyperopia (farsighted); P presbyopia; Mrx spectacle prescription;  CTL contact lenses; OD right eye; OS left eye; OU both eyes  XT exotropia; ET esotropia; PEK punctate epithelial keratitis; PEE punctate epithelial erosions; DES dry eye syndrome; MGD meibomian gland dysfunction; ATs artificial tears; PFAT's preservative free artificial tears; [place] nuclear sclerotic cataract; PSC posterior subcapsular cataract; ERM epi-retinal membrane; PVD posterior vitreous detachment; RD retinal detachment; DM diabetes mellitus; DR diabetic retinopathy; NPDR non-proliferative diabetic retinopathy; PDR proliferative diabetic retinopathy; CSME clinically significant macular edema; DME diabetic macular edema; dbh dot blot hemorrhages; CWS cotton wool spot; POAG primary open angle glaucoma; C/D cup-to-disc ratio; HVF humphrey visual field; GVF goldmann visual field; OCT optical coherence tomography; IOP intraocular pressure; BRVO Branch retinal vein occlusion; CRVO central retinal vein occlusion; CRAO central retinal artery occlusion; BRAO branch retinal artery occlusion; RT retinal tear; SB scleral buckle; PPV pars plana vitrectomy; VH Vitreous hemorrhage; PRP panretinal laser photocoagulation; IVK intravitreal kenalog; VMT vitreomacular traction; MH Macular hole;  NVD neovascularization of the disc; NVE neovascularization elsewhere; AREDS age related eye disease study; ARMD age related macular degeneration; POAG primary open angle glaucoma; EBMD epithelial/anterior basement membrane dystrophy; ACIOL anterior chamber intraocular lens; IOL intraocular lens; PCIOL posterior chamber intraocular lens; Phaco/IOL phacoemulsification with intraocular lens placement; [place] photorefractive keratectomy; LASIK laser assisted in situ keratomileusis; HTN hypertension; DM diabetes mellitus; COPD chronic obstructive pulmonary disease

## 2018-10-24 ENCOUNTER — Ambulatory Visit (INDEPENDENT_AMBULATORY_CARE_PROVIDER_SITE_OTHER): Payer: Medicare HMO | Admitting: Ophthalmology

## 2018-10-24 ENCOUNTER — Other Ambulatory Visit: Payer: Self-pay

## 2018-10-24 ENCOUNTER — Encounter (INDEPENDENT_AMBULATORY_CARE_PROVIDER_SITE_OTHER): Payer: Self-pay | Admitting: Ophthalmology

## 2018-10-24 DIAGNOSIS — H353132 Nonexudative age-related macular degeneration, bilateral, intermediate dry stage: Secondary | ICD-10-CM | POA: Diagnosis not present

## 2018-10-24 DIAGNOSIS — Z961 Presence of intraocular lens: Secondary | ICD-10-CM | POA: Diagnosis not present

## 2018-10-24 DIAGNOSIS — H3581 Retinal edema: Secondary | ICD-10-CM

## 2018-10-24 DIAGNOSIS — H35372 Puckering of macula, left eye: Secondary | ICD-10-CM

## 2018-10-24 DIAGNOSIS — H04123 Dry eye syndrome of bilateral lacrimal glands: Secondary | ICD-10-CM | POA: Diagnosis not present

## 2018-12-13 DIAGNOSIS — L570 Actinic keratosis: Secondary | ICD-10-CM | POA: Diagnosis not present

## 2018-12-13 DIAGNOSIS — I7090 Unspecified atherosclerosis: Secondary | ICD-10-CM | POA: Diagnosis not present

## 2018-12-13 DIAGNOSIS — M25559 Pain in unspecified hip: Secondary | ICD-10-CM | POA: Diagnosis not present

## 2018-12-13 DIAGNOSIS — M542 Cervicalgia: Secondary | ICD-10-CM | POA: Diagnosis not present

## 2018-12-13 DIAGNOSIS — M810 Age-related osteoporosis without current pathological fracture: Secondary | ICD-10-CM | POA: Diagnosis not present

## 2018-12-13 DIAGNOSIS — H8110 Benign paroxysmal vertigo, unspecified ear: Secondary | ICD-10-CM | POA: Diagnosis not present

## 2018-12-13 DIAGNOSIS — Z0000 Encounter for general adult medical examination without abnormal findings: Secondary | ICD-10-CM | POA: Diagnosis not present

## 2018-12-13 DIAGNOSIS — R809 Proteinuria, unspecified: Secondary | ICD-10-CM | POA: Diagnosis not present

## 2018-12-13 DIAGNOSIS — D72829 Elevated white blood cell count, unspecified: Secondary | ICD-10-CM | POA: Diagnosis not present

## 2018-12-13 DIAGNOSIS — R69 Illness, unspecified: Secondary | ICD-10-CM | POA: Diagnosis not present

## 2018-12-19 DIAGNOSIS — R82998 Other abnormal findings in urine: Secondary | ICD-10-CM | POA: Diagnosis not present

## 2018-12-19 DIAGNOSIS — I10 Essential (primary) hypertension: Secondary | ICD-10-CM | POA: Diagnosis not present

## 2018-12-25 DIAGNOSIS — R69 Illness, unspecified: Secondary | ICD-10-CM | POA: Diagnosis not present

## 2019-01-02 ENCOUNTER — Encounter (INDEPENDENT_AMBULATORY_CARE_PROVIDER_SITE_OTHER): Payer: Medicare HMO | Admitting: Ophthalmology

## 2019-02-11 ENCOUNTER — Encounter (HOSPITAL_COMMUNITY): Payer: Medicare HMO

## 2019-02-19 ENCOUNTER — Other Ambulatory Visit: Payer: Self-pay

## 2019-02-19 ENCOUNTER — Ambulatory Visit (HOSPITAL_COMMUNITY)
Admission: RE | Admit: 2019-02-19 | Discharge: 2019-02-19 | Disposition: A | Discharge status: Home / Self Care | Payer: Medicare HMO | Source: Ambulatory Visit | Attending: Ophthalmology | Admitting: Ophthalmology

## 2019-02-19 ENCOUNTER — Encounter (HOSPITAL_COMMUNITY): Payer: Self-pay

## 2019-02-19 DIAGNOSIS — M810 Age-related osteoporosis without current pathological fracture: Secondary | ICD-10-CM | POA: Diagnosis not present

## 2019-02-19 HISTORY — DX: Age-related osteoporosis without current pathological fracture: M81.0

## 2019-02-19 MED ORDER — DENOSUMAB 60 MG/ML ~~LOC~~ SOSY
60.0000 mg | PREFILLED_SYRINGE | Freq: Once | SUBCUTANEOUS | Status: AC
  Administered 2019-02-19: 60 mg via SUBCUTANEOUS
  Filled 2019-02-19: qty 1

## 2019-02-19 NOTE — Discharge Instructions (Signed)
Denosumab injection °What is this medicine? °DENOSUMAB (den oh sue mab) slows bone breakdown. Prolia is used to treat osteoporosis in women after menopause and in men, and in people who are taking corticosteroids for 6 months or more. Xgeva is used to treat a high calcium level due to cancer and to prevent bone fractures and other bone problems caused by multiple myeloma or cancer bone metastases. Xgeva is also used to treat giant cell tumor of the bone. °This medicine may be used for other purposes; ask your health care provider or pharmacist if you have questions. °COMMON BRAND NAME(S): Prolia, XGEVA °What should I tell my health care provider before I take this medicine? °They need to know if you have any of these conditions: °· dental disease °· having surgery or tooth extraction °· infection °· kidney disease °· low levels of calcium or Vitamin D in the blood °· malnutrition °· on hemodialysis °· skin conditions or sensitivity °· thyroid or parathyroid disease °· an unusual reaction to denosumab, other medicines, foods, dyes, or preservatives °· pregnant or trying to get pregnant °· breast-feeding °How should I use this medicine? °This medicine is for injection under the skin. It is given by a health care professional in a hospital or clinic setting. °A special MedGuide will be given to you before each treatment. Be sure to read this information carefully each time. °For Prolia, talk to your pediatrician regarding the use of this medicine in children. Special care may be needed. For Xgeva, talk to your pediatrician regarding the use of this medicine in children. While this drug may be prescribed for children as young as 13 years for selected conditions, precautions do apply. °Overdosage: If you think you have taken too much of this medicine contact a poison control center or emergency room at once. °NOTE: This medicine is only for you. Do not share this medicine with others. °What if I miss a dose? °It is  important not to miss your dose. Call your doctor or health care professional if you are unable to keep an appointment. °What may interact with this medicine? °Do not take this medicine with any of the following medications: °· other medicines containing denosumab °This medicine may also interact with the following medications: °· medicines that lower your chance of fighting infection °· steroid medicines like prednisone or cortisone °This list may not describe all possible interactions. Give your health care provider a list of all the medicines, herbs, non-prescription drugs, or dietary supplements you use. Also tell them if you smoke, drink alcohol, or use illegal drugs. Some items may interact with your medicine. °What should I watch for while using this medicine? °Visit your doctor or health care professional for regular checks on your progress. Your doctor or health care professional may order blood tests and other tests to see how you are doing. °Call your doctor or health care professional for advice if you get a fever, chills or sore throat, or other symptoms of a cold or flu. Do not treat yourself. This drug may decrease your body's ability to fight infection. Try to avoid being around people who are sick. °You should make sure you get enough calcium and vitamin D while you are taking this medicine, unless your doctor tells you not to. Discuss the foods you eat and the vitamins you take with your health care professional. °See your dentist regularly. Brush and floss your teeth as directed. Before you have any dental work done, tell your dentist you are   receiving this medicine. Do not become pregnant while taking this medicine or for 5 months after stopping it. Talk with your doctor or health care professional about your birth control options while taking this medicine. Women should inform their doctor if they wish to become pregnant or think they might be pregnant. There is a potential for serious side  effects to an unborn child. Talk to your health care professional or pharmacist for more information. What side effects may I notice from receiving this medicine? Side effects that you should report to your doctor or health care professional as soon as possible:  allergic reactions like skin rash, itching or hives, swelling of the face, lips, or tongue  bone pain  breathing problems  dizziness  jaw pain, especially after dental work  redness, blistering, peeling of the skin  signs and symptoms of infection like fever or chills; cough; sore throat; pain or trouble passing urine  signs of low calcium like fast heartbeat, muscle cramps or muscle pain; pain, tingling, numbness in the hands or feet; seizures  unusual bleeding or bruising  unusually weak or tired Side effects that usually do not require medical attention (report to your doctor or health care professional if they continue or are bothersome):  constipation  diarrhea  headache  joint pain  loss of appetite  muscle pain  runny nose  tiredness  upset stomach This list may not describe all possible side effects. Call your doctor for medical advice about side effects. You may report side effects to FDA at 1-800-FDA-1088. Where should I keep my medicine? This medicine is only given in a clinic, doctor's office, or other health care setting and will not be stored at home. NOTE: This sheet is a summary. It may not cover all possible information. If you have questions about this medicine, talk to your doctor, pharmacist, or health care provider.  2020 Elsevier/Gold Standard (2017-10-13 16:10:44)

## 2019-03-01 DIAGNOSIS — R921 Mammographic calcification found on diagnostic imaging of breast: Secondary | ICD-10-CM | POA: Diagnosis not present

## 2019-03-01 DIAGNOSIS — Z803 Family history of malignant neoplasm of breast: Secondary | ICD-10-CM | POA: Diagnosis not present

## 2019-03-01 DIAGNOSIS — N6310 Unspecified lump in the right breast, unspecified quadrant: Secondary | ICD-10-CM | POA: Diagnosis not present

## 2019-03-13 DIAGNOSIS — Z23 Encounter for immunization: Secondary | ICD-10-CM | POA: Diagnosis not present

## 2019-04-08 DIAGNOSIS — L089 Local infection of the skin and subcutaneous tissue, unspecified: Secondary | ICD-10-CM

## 2019-04-08 DIAGNOSIS — M19042 Primary osteoarthritis, left hand: Secondary | ICD-10-CM

## 2019-04-08 DIAGNOSIS — M6740 Ganglion, unspecified site: Secondary | ICD-10-CM

## 2019-04-08 HISTORY — DX: Primary osteoarthritis, left hand: M19.042

## 2019-04-08 HISTORY — DX: Local infection of the skin and subcutaneous tissue, unspecified: L08.9

## 2019-04-08 HISTORY — DX: Ganglion, unspecified site: M67.40

## 2019-04-12 DIAGNOSIS — M6740 Ganglion, unspecified site: Secondary | ICD-10-CM | POA: Diagnosis not present

## 2019-04-12 DIAGNOSIS — L089 Local infection of the skin and subcutaneous tissue, unspecified: Secondary | ICD-10-CM | POA: Diagnosis not present

## 2019-04-12 DIAGNOSIS — M19042 Primary osteoarthritis, left hand: Secondary | ICD-10-CM | POA: Diagnosis not present

## 2019-04-23 DIAGNOSIS — M6740 Ganglion, unspecified site: Secondary | ICD-10-CM | POA: Diagnosis not present

## 2019-04-23 DIAGNOSIS — M19042 Primary osteoarthritis, left hand: Secondary | ICD-10-CM | POA: Diagnosis not present

## 2019-04-24 NOTE — Progress Notes (Addendum)
[facility] Note  04/29/2019     CHIEF COMPLAINT Patient presents for Retina Follow Up   HISTORY OF PRESENT ILLNESS: Michelle Keith is a 85 y.o. female who presents to the clinic today for:   HPI    Retina Follow Up    Patient presents with  Wet AMD.  In both eyes.  This started 11.  Since onset it is stable.  I, the attending physician,  performed the HPI with the patient and updated documentation appropriately.          Comments    6 mos F/U Non EXU AMD OU. Patient states her vision "seems to be the same", denies new visual onsets/issues.Pt is using Thera tears OU Bid.        Last edited by Bernarda Caffey, MD on 04/30/2019  1:05 PM. (History)    pt states she doesn't feel like she is seeing quite as well as last visit, pt uses thera tears bid OU  Referring physician: Calvert Cantor, MD [address] [place],  [place] [address]  HISTORICAL INFORMATION:   Selected notes from the MEDICAL RECORD NUMBER Referred by Dr. Bing Plume for evaluation of AMD OU LEE- 11.29.18 (D. Digby) [BCVA OD: 20/25 OS: 20/25] Ocular Hx- pseudophakia OU ([place]), AMD OU, S/P bil bleph, ischemic optic neuropathy OS PMH- HTN    CURRENT MEDICATIONS: No current outpatient medications on file. (Ophthalmic Drugs)   No current facility-administered medications for this visit.  (Ophthalmic Drugs)   Current Outpatient Medications (Other)  Medication Sig  . cholecalciferol (VITAMIN D) 1000 UNITS tablet Take 2,000 Units by mouth daily.   . cholecalciferol (VITAMIN D) 1000 units tablet Vitamin D  . denosumab (PROLIA) 60 MG/ML SOSY injection Inject 60 mg into the skin once. 2 times a year  . metoprolol tartrate (LOPRESSOR) 25 MG tablet Pt takes 1 1/2 tablet by mouth daily  . mirtazapine (REMERON) 15 MG tablet Take 3.75 mg by mouth at bedtime.   . Multiple Vitamins-Minerals (PRESERVISION AREDS 2 PO) Take by mouth.  . Multiple Vitamins-Minerals (PRESERVISION AREDS 2+MULTI  VIT PO) PreserVision AREDS  . pantoprazole (PROTONIX) 40 MG tablet Take 40 mg by mouth daily as needed (heartburn).   . Probiotic Product (PROBIOTIC DAILY PO) Take 1 capsule by mouth daily.   . Probiotic Product (PROBIOTIC-10 PO) Probiotic  . raloxifene (EVISTA) 60 MG tablet Take 60 mg by mouth daily.  Marland Kitchen zolpidem (AMBIEN) 10 MG tablet Take 2.5 mg by mouth daily as needed for sleep.    No current facility-administered medications for this visit.  (Other)      REVIEW OF SYSTEMS: ROS    Positive for: Eyes   Negative for: Constitutional, Gastrointestinal, Neurological, Skin, Genitourinary, Musculoskeletal, HENT, Endocrine, Cardiovascular, Respiratory, Psychiatric, Allergic/Imm, Heme/Lymph   Last edited by Zenovia Jordan, LPN on 51/06/209 17:35 AM. (History)       ALLERGIES Allergies  Allergen Reactions  . Epinephrine Anaphylaxis  . Lidocaine Anaphylaxis  . Aspirin   . Atenolol   . Clarithromycin   . Cortisone Other (See Comments)  . Omeprazole Magnesium Other (See Comments)    Ask at office visit  . Penicillins   . Pindolol Diarrhea    Fatigue, hip pain  . Pseudoephedrine-Guaifenesin Other (See Comments)  . Sulfonamide Derivatives   . Tetracycline Hives  . Glycol Stearate Palpitations  . Valsartan Palpitations    PAST MEDICAL HISTORY Past Medical History:  Diagnosis Date  . Dysautonomia ([place])   .  Esophageal stricture   . GERD (gastroesophageal reflux disease)   . Hiatal hernia   . IBS (irritable bowel syndrome)   . Osteopenia   . Osteoporosis   . Right thyroid nodule   . Squamous cell skin cancer   . Syncope    Past Surgical History:  Procedure Laterality Date  . C-EYE SURGERY PROCEDURE     30 yrs ago  . Cataract extraction    . CATARACT EXTRACTION  2001  . COLONOSCOPY  2009   Also 2002  . ESOPHAGEAL DILATION     x 4  . ESOPHAGEAL MANOMETRY    . ESOPHAGOGASTRODUODENOSCOPY  Multiple  . Exploratory laparoscopy    . EYE SURGERY    . NISSEN  FUNDOPLICATION    . TOTAL ABDOMINAL HYSTERECTOMY      FAMILY HISTORY Family History  Problem Relation Age of Onset  . Multiple myeloma Father        died age 59  . Heart failure Mother        died age 62    SOCIAL HISTORY Social History   Tobacco Use  . Smoking status: Never Smoker  . Smokeless tobacco: Never Used  Substance Use Topics  . Alcohol use: No    Alcohol/week: 0.0 standard drinks  . Drug use: No         OPHTHALMIC EXAM:  Base Eye Exam    Visual Acuity (Snellen - Linear)      Right Left   Dist [place] 20/20 -1 20/40 -1   Dist ph  20/20 20/40       Tonometry (Tonopen, 10:21 AM)      Right Left   Pressure 12 14       Pupils      Dark Light Shape React APD   Right 3 2 Round Brisk None   Left 3 2 Round Brisk None       Visual Fields (Counting fingers)      Left Right     Full       Extraocular Movement      Right Left    Full, Ortho Full, Ortho       Neuro/Psych    Oriented x3: Yes   Mood/Affect: Normal       Dilation    Both eyes: 1.0% Mydriacyl, 2.5% Phenylephrine @ 10:19 AM        Slit Lamp and Fundus Exam    External Exam      Right Left   External Normal Normal       Slit Lamp Exam      Right Left   Lids/Lashes Dermatochalasis - upper lid, Meibomian gland dysfunction Dermatochalasis - upper lid   Conjunctiva/Sclera White and quiet White and quiet   Cornea arcus, Inferior 2+ inferior peripheral Punctate epithelial erosions, Anterior basement membrane dystrophy, Debris in tear film arcus; ABMD, 3+ peripheral Punctate epithelial erosions, mild peripheral corneal haze, Debris in tear film   Anterior Chamber Deep and quiet Deep and quiet   Iris Round, dilated Round, dilated   Lens PCIOL; open PC PCIOL; tr PCO non central   Vitreous Vitreous syneresis, PVD, vitreous condensations Vitreous syneresis, PVD, vitreous condensations       Fundus Exam      Right Left   Disc compact, sharp rim, trace Peripapillary atrophy inferiorly  compact, Sharp rim   C/D Ratio 0.1 0.1   Macula Blunted foveal reflex, RPE mottling and clumping; drusen; no heme or edema flat, Blunted foveal reflex, RPE  mottling and clumping, ERM; drusen; no heme or edema   Vessels attenuated attenuated, mild Tortuousity   Periphery Attached, cobblestoning at 0700, mild Reticular degeneration attached          IMAGING AND PROCEDURES  Imaging and Procedures for 08/29/17  OCT, Retina - OU - Both Eyes       Right Eye Quality was good. Central Foveal Thickness: 341. Progression has been stable. Findings include no IRF, no SRF, retinal drusen , abnormal foveal contour (Persistent drusen - stable from prior).   Left Eye Quality was good. Central Foveal Thickness: 399. Progression has been stable. Findings include no IRF, no SRF, abnormal foveal contour, retinal drusen , epiretinal membrane.   Notes Images taken, stored on drive  Diagnosis / Impression:  nonexudative ARMD OU -- persistent drusen ERM OS -- mild  Clinical management:  See below  Abbreviations: NFP - Normal foveal profile. CME - cystoid macular edema. PED - pigment epithelial detachment. IRF - intraretinal fluid. SRF - subretinal fluid. EZ - ellipsoid zone. ERM - epiretinal membrane. ORA - outer retinal atrophy. ORT - outer retinal tubulation. SRHM - subretinal hyper-reflective material                  ASSESSMENT/PLAN:    ICD-10-CM   1. Intermediate stage nonexudative age-related macular degeneration of both eyes  H35.3132   2. Retinal edema  H35.81 OCT, Retina - OU - Both Eyes  3. Epiretinal membrane (ERM) of left eye  H35.372   4. Pseudophakia of both eyes  Z96.1   5. Dry eyes  H04.123     1,2. Age related macular degeneration, non-exudative, both eyes  - mild-intermediate stage OU -- stable drusen on OCT  - The incidence, anatomy, and pathology of dry AMD, risk of progression, and the AREDS and AREDS 2 study including smoking risks discussed with patient.  -  continue amsler grid monitoring  - f/u 6-9 months, sooner prn  3. Epiretinal membrane, left eye   - very mild ERM, BCVA 20/30 with minimal metamorphopsia OS  - OCT and exam stable from prior  - no surgical intervention indicated at this time  - monitor  - f/u in 6 mos  4. Pseudophakia OU  - s/p CE/IOL OU w/ Dr. Herbert Deaner  - beautiful surgeries, doing well  - monitor  5. Dry Eyes OU  - BCVA remains 20/20 OD, OS slighlty decreased to 20/40 from 20/25   - more symptomatic  - exam shows persistent PEE (2-3+), ABMD, decreased TBUT and tear film debris  - recommend hot compresses OU BID  - recommend artificial tears and lubricating ointment as needed   Ophthalmic Meds Ordered this visit:  No orders of the defined types were placed in this encounter.      Return for f/u 6-9 months exu ARMD OU, DFE, OCT.  There are no Patient Instructions on file for this visit.   This document serves as a record of services personally performed by Gardiner Sleeper, MD, PhD. It was created on their behalf by Leeann Must, [place], a certified ophthalmic assistant. The creation of this record is the provider's dictation and/or activities during the visit.    Electronically signed by: Leeann Must, [place] 11.06.2020 1:08 PM   This document serves as a record of services personally performed by Gardiner Sleeper, MD, PhD. It was created on their behalf by Ernest Mallick, OA, an ophthalmic assistant. The creation of this record is the provider's dictation and/or activities during the  visit.    Electronically signed by: Ernest Mallick, OA 11.09.2020 1:08 PM   Gardiner Sleeper, M.D., Ph.D. Diseases & Surgery of the Retina and Vitreous [facility]  I have reviewed the above documentation for accuracy and completeness, and I agree with the above. Gardiner Sleeper, M.D., Ph.D. 04/30/19 1:08 PM     Abbreviations: M myopia (nearsighted); A astigmatism; H hyperopia (farsighted); P presbyopia;  Mrx spectacle prescription;  CTL contact lenses; OD right eye; OS left eye; OU both eyes  XT exotropia; ET esotropia; PEK punctate epithelial keratitis; PEE punctate epithelial erosions; DES dry eye syndrome; MGD meibomian gland dysfunction; ATs artificial tears; PFAT's preservative free artificial tears; [place] nuclear sclerotic cataract; PSC posterior subcapsular cataract; ERM epi-retinal membrane; PVD posterior vitreous detachment; RD retinal detachment; DM diabetes mellitus; DR diabetic retinopathy; NPDR non-proliferative diabetic retinopathy; PDR proliferative diabetic retinopathy; CSME clinically significant macular edema; DME diabetic macular edema; dbh dot blot hemorrhages; CWS cotton wool spot; POAG primary open angle glaucoma; C/D cup-to-disc ratio; HVF humphrey visual field; GVF goldmann visual field; OCT optical coherence tomography; IOP intraocular pressure; BRVO Branch retinal vein occlusion; CRVO central retinal vein occlusion; CRAO central retinal artery occlusion; BRAO branch retinal artery occlusion; RT retinal tear; SB scleral buckle; PPV pars plana vitrectomy; VH Vitreous hemorrhage; PRP panretinal laser photocoagulation; IVK intravitreal kenalog; VMT vitreomacular traction; MH Macular hole;  NVD neovascularization of the disc; NVE neovascularization elsewhere; AREDS age related eye disease study; ARMD age related macular degeneration; POAG primary open angle glaucoma; EBMD epithelial/anterior basement membrane dystrophy; ACIOL anterior chamber intraocular lens; IOL intraocular lens; PCIOL posterior chamber intraocular lens; Phaco/IOL phacoemulsification with intraocular lens placement; [place] photorefractive keratectomy; LASIK laser assisted in situ keratomileusis; HTN hypertension; DM diabetes mellitus; COPD chronic obstructive pulmonary disease

## 2019-04-29 ENCOUNTER — Ambulatory Visit (INDEPENDENT_AMBULATORY_CARE_PROVIDER_SITE_OTHER): Payer: Medicare HMO | Admitting: Ophthalmology

## 2019-04-29 DIAGNOSIS — H3581 Retinal edema: Secondary | ICD-10-CM | POA: Diagnosis not present

## 2019-04-29 DIAGNOSIS — Z961 Presence of intraocular lens: Secondary | ICD-10-CM | POA: Diagnosis not present

## 2019-04-29 DIAGNOSIS — H04123 Dry eye syndrome of bilateral lacrimal glands: Secondary | ICD-10-CM

## 2019-04-29 DIAGNOSIS — H35372 Puckering of macula, left eye: Secondary | ICD-10-CM | POA: Diagnosis not present

## 2019-04-29 DIAGNOSIS — H353132 Nonexudative age-related macular degeneration, bilateral, intermediate dry stage: Secondary | ICD-10-CM

## 2019-04-30 ENCOUNTER — Encounter (INDEPENDENT_AMBULATORY_CARE_PROVIDER_SITE_OTHER): Payer: Self-pay | Admitting: Ophthalmology

## 2019-05-23 DIAGNOSIS — H353233 Exudative age-related macular degeneration, bilateral, with inactive scar: Secondary | ICD-10-CM | POA: Diagnosis not present

## 2019-05-23 DIAGNOSIS — H02403 Unspecified ptosis of bilateral eyelids: Secondary | ICD-10-CM | POA: Diagnosis not present

## 2019-05-23 DIAGNOSIS — H26492 Other secondary cataract, left eye: Secondary | ICD-10-CM | POA: Diagnosis not present

## 2019-05-23 DIAGNOSIS — H47012 Ischemic optic neuropathy, left eye: Secondary | ICD-10-CM | POA: Diagnosis not present

## 2019-06-24 ENCOUNTER — Ambulatory Visit: Payer: Medicare HMO | Admitting: Cardiology

## 2019-06-27 ENCOUNTER — Ambulatory Visit: Payer: Medicare HMO | Admitting: Cardiology

## 2019-07-17 ENCOUNTER — Ambulatory Visit: Payer: Medicare HMO

## 2019-07-23 ENCOUNTER — Ambulatory Visit: Payer: Medicare HMO

## 2019-07-27 ENCOUNTER — Ambulatory Visit: Payer: Medicare HMO

## 2019-08-06 DIAGNOSIS — K589 Irritable bowel syndrome without diarrhea: Secondary | ICD-10-CM | POA: Diagnosis not present

## 2019-08-06 DIAGNOSIS — R030 Elevated blood-pressure reading, without diagnosis of hypertension: Secondary | ICD-10-CM | POA: Diagnosis not present

## 2019-08-06 DIAGNOSIS — E538 Deficiency of other specified B group vitamins: Secondary | ICD-10-CM | POA: Diagnosis not present

## 2019-08-06 DIAGNOSIS — H8110 Benign paroxysmal vertigo, unspecified ear: Secondary | ICD-10-CM | POA: Diagnosis not present

## 2019-08-06 DIAGNOSIS — K219 Gastro-esophageal reflux disease without esophagitis: Secondary | ICD-10-CM | POA: Diagnosis not present

## 2019-08-06 DIAGNOSIS — R69 Illness, unspecified: Secondary | ICD-10-CM | POA: Diagnosis not present

## 2019-08-06 DIAGNOSIS — I10 Essential (primary) hypertension: Secondary | ICD-10-CM | POA: Diagnosis not present

## 2019-08-06 DIAGNOSIS — G629 Polyneuropathy, unspecified: Secondary | ICD-10-CM | POA: Diagnosis not present

## 2019-08-06 DIAGNOSIS — M542 Cervicalgia: Secondary | ICD-10-CM | POA: Diagnosis not present

## 2019-08-06 DIAGNOSIS — D589 Hereditary hemolytic anemia, unspecified: Secondary | ICD-10-CM | POA: Diagnosis not present

## 2019-08-07 ENCOUNTER — Ambulatory Visit: Payer: Medicare HMO

## 2019-08-21 ENCOUNTER — Other Ambulatory Visit: Payer: Self-pay

## 2019-08-21 ENCOUNTER — Ambulatory Visit (HOSPITAL_COMMUNITY)
Admission: RE | Admit: 2019-08-21 | Discharge: 2019-08-21 | Disposition: A | Discharge status: Home / Self Care | Payer: Medicare HMO | Source: Ambulatory Visit | Attending: Ophthalmology | Admitting: Ophthalmology

## 2019-08-21 ENCOUNTER — Encounter (HOSPITAL_COMMUNITY): Payer: Self-pay

## 2019-08-21 DIAGNOSIS — M810 Age-related osteoporosis without current pathological fracture: Secondary | ICD-10-CM | POA: Insufficient documentation

## 2019-08-21 MED ORDER — DENOSUMAB 60 MG/ML ~~LOC~~ SOSY: 60.0000 mg | PREFILLED_SYRINGE | Freq: Once | SUBCUTANEOUS | Status: AC

## 2019-08-21 MED ORDER — DENOSUMAB 60 MG/ML ~~LOC~~ SOSY
PREFILLED_SYRINGE | SUBCUTANEOUS | Status: AC
  Administered 2019-08-21: 10:00:00 60 mg via SUBCUTANEOUS
  Filled 2019-08-21: qty 1

## 2019-08-21 NOTE — Discharge Instructions (Signed)
Denosumab injection What is this medicine? DENOSUMAB (den oh sue mab) slows bone breakdown. Prolia is used to treat osteoporosis in women after menopause and in men, and in people who are taking corticosteroids for 6 months or more. Xgeva is used to treat a high calcium level due to cancer and to prevent bone fractures and other bone problems caused by multiple myeloma or cancer bone metastases. Xgeva is also used to treat giant cell tumor of the bone. This medicine may be used for other purposes; ask your health care provider or pharmacist if you have questions. COMMON BRAND NAME(S): Prolia, XGEVA What should I tell my health care provider before I take this medicine? They need to know if you have any of these conditions:  dental disease  having surgery or tooth extraction  infection  kidney disease  low levels of calcium or Vitamin D in the blood  malnutrition  on hemodialysis  skin conditions or sensitivity  thyroid or parathyroid disease  an unusual reaction to denosumab, other medicines, foods, dyes, or preservatives  pregnant or trying to get pregnant  breast-feeding How should I use this medicine? This medicine is for injection under the skin. It is given by a health care professional in a hospital or clinic setting. A special MedGuide will be given to you before each treatment. Be sure to read this information carefully each time. For Prolia, talk to your pediatrician regarding the use of this medicine in children. Special care may be needed. For Xgeva, talk to your pediatrician regarding the use of this medicine in children. While this drug may be prescribed for children as young as 13 years for selected conditions, precautions do apply. Overdosage: If you think you have taken too much of this medicine contact a poison control center or emergency room at once. NOTE: This medicine is only for you. Do not share this medicine with others. What if I miss a dose? It is  important not to miss your dose. Call your doctor or health care professional if you are unable to keep an appointment. What may interact with this medicine? Do not take this medicine with any of the following medications:  other medicines containing denosumab This medicine may also interact with the following medications:  medicines that lower your chance of fighting infection  steroid medicines like prednisone or cortisone This list may not describe all possible interactions. Give your health care provider a list of all the medicines, herbs, non-prescription drugs, or dietary supplements you use. Also tell them if you smoke, drink alcohol, or use illegal drugs. Some items may interact with your medicine. What should I watch for while using this medicine? Visit your doctor or health care professional for regular checks on your progress. Your doctor or health care professional may order blood tests and other tests to see how you are doing. Call your doctor or health care professional for advice if you get a fever, chills or sore throat, or other symptoms of a cold or flu. Do not treat yourself. This drug may decrease your body's ability to fight infection. Try to avoid being around people who are sick. You should make sure you get enough calcium and vitamin D while you are taking this medicine, unless your doctor tells you not to. Discuss the foods you eat and the vitamins you take with your health care professional. See your dentist regularly. Brush and floss your teeth as directed. Before you have any dental work done, tell your dentist you are   receiving this medicine. Do not become pregnant while taking this medicine or for 5 months after stopping it. Talk with your doctor or health care professional about your birth control options while taking this medicine. Women should inform their doctor if they wish to become pregnant or think they might be pregnant. There is a potential for serious side  effects to an unborn child. Talk to your health care professional or pharmacist for more information. What side effects may I notice from receiving this medicine? Side effects that you should report to your doctor or health care professional as soon as possible:  allergic reactions like skin rash, itching or hives, swelling of the face, lips, or tongue  bone pain  breathing problems  dizziness  jaw pain, especially after dental work  redness, blistering, peeling of the skin  signs and symptoms of infection like fever or chills; cough; sore throat; pain or trouble passing urine  signs of low calcium like fast heartbeat, muscle cramps or muscle pain; pain, tingling, numbness in the hands or feet; seizures  unusual bleeding or bruising  unusually weak or tired Side effects that usually do not require medical attention (report to your doctor or health care professional if they continue or are bothersome):  constipation  diarrhea  headache  joint pain  loss of appetite  muscle pain  runny nose  tiredness  upset stomach This list may not describe all possible side effects. Call your doctor for medical advice about side effects. You may report side effects to FDA at 1-800-FDA-1088. Where should I keep my medicine? This medicine is only given in a clinic, doctor's office, or other health care setting and will not be stored at home. NOTE: This sheet is a summary. It may not cover all possible information. If you have questions about this medicine, talk to your doctor, pharmacist, or health care provider.  2020 Elsevier/Gold Standard (2017-10-13 16:10:44)  

## 2019-08-23 DIAGNOSIS — Z01419 Encounter for gynecological examination (general) (routine) without abnormal findings: Secondary | ICD-10-CM | POA: Diagnosis not present

## 2019-09-05 ENCOUNTER — Encounter: Payer: Self-pay | Admitting: General Practice

## 2019-09-06 DIAGNOSIS — R42 Dizziness and giddiness: Secondary | ICD-10-CM | POA: Diagnosis not present

## 2019-09-06 DIAGNOSIS — H8110 Benign paroxysmal vertigo, unspecified ear: Secondary | ICD-10-CM | POA: Diagnosis not present

## 2019-09-10 DIAGNOSIS — R69 Illness, unspecified: Secondary | ICD-10-CM | POA: Diagnosis not present

## 2019-09-13 DIAGNOSIS — H903 Sensorineural hearing loss, bilateral: Secondary | ICD-10-CM | POA: Diagnosis not present

## 2019-09-13 DIAGNOSIS — H8111 Benign paroxysmal vertigo, right ear: Secondary | ICD-10-CM | POA: Diagnosis not present

## 2019-09-19 DIAGNOSIS — Z23 Encounter for immunization: Secondary | ICD-10-CM | POA: Diagnosis not present

## 2019-10-28 NOTE — Progress Notes (Signed)
[facility] Note  10/29/2019     CHIEF COMPLAINT Patient presents for Retina Follow Up   HISTORY OF PRESENT ILLNESS: Michelle Keith is a 86 y.o. female who presents to the clinic today for:   HPI    Retina Follow Up    Patient presents with  Dry AMD.  In both eyes.  Duration of 6 months.  Since onset it is gradually worsening.  I, the attending physician,  performed the HPI with the patient and updated documentation appropriately.          Comments    Patient feels she can see better on sunny days.  This is a change since last visit OU.  Denies pain OU.  Some discomfort due to dryness on occasion.  Thera Tears help.       Last edited by Bernarda Caffey, MD on 10/29/2019 11:48 AM. (History)    pt states no new visual complaints  Referring physician: Calvert Cantor, MD [address] [place],  [place] [id number]  HISTORICAL INFORMATION:   Selected notes from the MEDICAL RECORD NUMBER Referred by Dr. Bing Plume for evaluation of AMD OU    CURRENT MEDICATIONS: No current outpatient medications on file. (Ophthalmic Drugs)   No current facility-administered medications for this visit. (Ophthalmic Drugs)   Current Outpatient Medications (Other)  Medication Sig  . Calcium-Vitamin D-Vitamin K (VIACTIV PO) Take 1 tablet by mouth 2 (two) times daily.  . cholecalciferol (VITAMIN D) 1000 UNITS tablet Take 2,000 Units by mouth daily.   . cholecalciferol (VITAMIN D) 1000 units tablet Vitamin D  . denosumab (PROLIA) 60 MG/ML SOSY injection Inject 60 mg into the skin once. 2 times a year  . meclizine (ANTIVERT) 25 MG tablet Take 25 mg by mouth 2 (two) times daily as needed for dizziness.  . metoprolol tartrate (LOPRESSOR) 25 MG tablet Pt takes 1 1/2 tablet by mouth daily  . mirtazapine (REMERON) 15 MG tablet Take 3.75 mg by mouth at bedtime.   . Multiple Vitamins-Minerals (PRESERVISION AREDS 2 PO) Take by mouth.  . Multiple Vitamins-Minerals (PRESERVISION  AREDS 2+MULTI VIT PO) PreserVision AREDS  . pantoprazole (PROTONIX) 40 MG tablet Take 40 mg by mouth daily as needed (heartburn). Patient states she takes it every other day prn  . Probiotic Product (PROBIOTIC DAILY PO) Take 1 capsule by mouth daily.   . Probiotic Product (PROBIOTIC-10 PO) Probiotic  . raloxifene (EVISTA) 60 MG tablet Take 60 mg by mouth daily.  . valACYclovir (VALTREX) 500 MG tablet Take 500 mg by mouth 2 (two) times daily.  Marland Kitchen zolpidem (AMBIEN) 10 MG tablet Take 2.5 mg by mouth daily as needed for sleep.    No current facility-administered medications for this visit. (Other)      REVIEW OF SYSTEMS: ROS    Positive for: Eyes   Negative for: Constitutional, Gastrointestinal, Neurological, Skin, Genitourinary, Musculoskeletal, HENT, Endocrine, Cardiovascular, Respiratory, Psychiatric, Allergic/Imm, Heme/Lymph   Last edited by Leonie Douglas, COA on 10/29/2019 10:27 AM. (History)       ALLERGIES Allergies  Allergen Reactions  . Epinephrine Anaphylaxis  . Lidocaine Anaphylaxis  . Aspirin   . Atenolol   . Clarithromycin   . Cortisone Other (See Comments)  . Omeprazole Magnesium Other (See Comments)    Ask at office visit  . Penicillins   . Pindolol Diarrhea    Fatigue, hip pain  . Pseudoephedrine-Guaifenesin Other (See Comments)  . Sulfonamide Derivatives   . Tetracycline Hives  .  Glycol Stearate Palpitations  . Valsartan Palpitations    PAST MEDICAL HISTORY Past Medical History:  Diagnosis Date  . Dysautonomia ([place])   . Esophageal stricture   . GERD (gastroesophageal reflux disease)   . Hiatal hernia   . IBS (irritable bowel syndrome)   . Osteopenia   . Osteoporosis   . Right thyroid nodule   . Squamous cell skin cancer   . Syncope    Past Surgical History:  Procedure Laterality Date  . C-EYE SURGERY PROCEDURE     30 yrs ago  . Cataract extraction    . CATARACT EXTRACTION  2001  . COLONOSCOPY  2009   Also 2002  . ESOPHAGEAL DILATION     x 4   . ESOPHAGEAL MANOMETRY    . ESOPHAGOGASTRODUODENOSCOPY  Multiple  . Exploratory laparoscopy    . EYE SURGERY    . NISSEN FUNDOPLICATION    . TOTAL ABDOMINAL HYSTERECTOMY      FAMILY HISTORY Family History  Problem Relation Age of Onset  . Multiple myeloma Father        died age 23  . Heart failure Mother        died age 46    SOCIAL HISTORY Social History   Tobacco Use  . Smoking status: Never Smoker  . Smokeless tobacco: Never Used  Substance Use Topics  . Alcohol use: No    Alcohol/week: 0.0 standard drinks  . Drug use: No         OPHTHALMIC EXAM:  Base Eye Exam    Visual Acuity (Snellen - Linear)      Right Left   Dist [place] 20/20 20/40       Tonometry (Tonopen, 10:34 AM)      Right Left   Pressure 10 10       Pupils      Dark Light Shape React APD   Right 3 2 Round Brisk None   Left 3 2 Round Brisk None       Visual Fields (Counting fingers)      Left Right    Full Full       Extraocular Movement      Right Left    Full Full       Neuro/Psych    Oriented x3: Yes   Mood/Affect: Normal       Dilation    Both eyes: 1.0% Mydriacyl, 2.5% Phenylephrine @ 10:34 AM        Slit Lamp and Fundus Exam    External Exam      Right Left   External Normal Normal       Slit Lamp Exam      Right Left   Lids/Lashes Dermatochalasis - upper lid, mild Meibomian gland dysfunction Dermatochalasis - upper lid   Conjunctiva/Sclera White and quiet White and quiet   Cornea arcus, 1+ inferior peripheral Punctate epithelial erosions, Anterior basement membrane dystrophy arcus; ABMD, 1+Punctate epithelial erosions, mild peripheral corneal haze, Debris in tear film   Anterior Chamber Deep and quiet Deep and quiet   Iris Round, dilated Round, dilated   Lens PCIOL; open PC PCIOL; 1+ PCO non central   Vitreous Vitreous syneresis, PVD, vitreous condensations Vitreous syneresis, PVD, vitreous condensations       Fundus Exam      Right Left   Disc compact, trace  Peripapillary atrophy inferiorly, Pink and Sharp compact, Sharp rim   C/D Ratio 0.1 0.1   Macula Flat, Blunted foveal reflex, RPE mottling and  clumping; drusen; no heme or edema flat, Blunted foveal reflex, RPE mottling and clumping, ERM - stable; drusen; no heme or edema   Vessels attenuated, Tortuous attenuated, mild Tortuousity   Periphery Attached, cobblestoning at 0700, mild Reticular degeneration attached          IMAGING AND PROCEDURES  Imaging and Procedures for 08/29/17  OCT, Retina - OU - Both Eyes       Right Eye Quality was good. Central Foveal Thickness: 344. Progression has been stable. Findings include no IRF, no SRF, retinal drusen , abnormal foveal contour (Persistent drusen - stable from prior).   Left Eye Quality was good. Central Foveal Thickness: 392. Progression has been stable. Findings include no IRF, no SRF, abnormal foveal contour, retinal drusen , epiretinal membrane.   Notes Images taken, stored on drive  Diagnosis / Impression:  nonexudative ARMD OU -- persistent drusen -- no significant change since 2018 ERM OS -- mild -- stable since 2018  Clinical management:  See below  Abbreviations: NFP - Normal foveal profile. CME - cystoid macular edema. PED - pigment epithelial detachment. IRF - intraretinal fluid. SRF - subretinal fluid. EZ - ellipsoid zone. ERM - epiretinal membrane. ORA - outer retinal atrophy. ORT - outer retinal tubulation. SRHM - subretinal hyper-reflective material                  ASSESSMENT/PLAN:    ICD-10-CM   1. Intermediate stage nonexudative age-related macular degeneration of both eyes  H35.3132   2. Retinal edema  H35.81 OCT, Retina - OU - Both Eyes  3. Epiretinal membrane (ERM) of left eye  H35.372   4. Pseudophakia of both eyes  Z96.1   5. Dry eyes  H04.123     1,2. Age related macular degeneration, non-exudative, both eyes  - mild-intermediate stage OU -- stable drusen on OCT  - The incidence, anatomy, and  pathology of dry AMD, risk of progression, and the AREDS and AREDS 2 study including smoking risks discussed with patient.  - continue amsler grid monitoring  - f/u 6 months, sooner prn  3. Epiretinal membrane, left eye   - very mild ERM, BCVA 20/40 with minimal metamorphopsia OS  - OCT and exam stable from prior -- reviewed all OCTs dating back to 2018 -- no significant change  - no surgical intervention indicated at this time  - monitor  - f/u in 6 mos  4. Pseudophakia OU  - s/p CE/IOL OU w/ Dr. Herbert Deaner  - beautiful surgeries, doing well  - monitor  5. Dry Eyes OU  - BCVA remains 20/20 OD, OS sltable to 20/40  - more symptomatic  - exam shows persistent PEE (1+), ABMD, decreased TBUT and tear film debris  - recommend hot compresses OU BID  - recommend artificial tears and lubricating ointment as needed   Ophthalmic Meds Ordered this visit:  No orders of the defined types were placed in this encounter.      Return in about 6 months (around 04/30/2020).  There are no Patient Instructions on file for this visit.  This document serves as a record of services personally performed by Gardiner Sleeper, MD, PhD. It was created on their behalf by Estill Bakes, COT an ophthalmic technician. The creation of this record is the provider's dictation and/or activities during the visit.    Electronically signed by: Estill Bakes, COT 10/28/19 @ 1:58 PM  Gardiner Sleeper, M.D., Ph.D. Diseases & Surgery of the Retina and Vitreous [facility] 10/29/2019   I have reviewed the above documentation for accuracy and completeness, and I agree with the above. Gardiner Sleeper, M.D., Ph.D. 10/29/19 1:58 PM    Abbreviations: M myopia (nearsighted); A astigmatism; H hyperopia (farsighted); P presbyopia; Mrx spectacle prescription;  CTL contact lenses; OD right eye; OS left eye; OU both eyes  XT exotropia; ET esotropia; PEK punctate epithelial keratitis; PEE punctate epithelial  erosions; DES dry eye syndrome; MGD meibomian gland dysfunction; ATs artificial tears; PFAT's preservative free artificial tears; [place] nuclear sclerotic cataract; PSC posterior subcapsular cataract; ERM epi-retinal membrane; PVD posterior vitreous detachment; RD retinal detachment; DM diabetes mellitus; DR diabetic retinopathy; NPDR non-proliferative diabetic retinopathy; PDR proliferative diabetic retinopathy; CSME clinically significant macular edema; DME diabetic macular edema; dbh dot blot hemorrhages; CWS cotton wool spot; POAG primary open angle glaucoma; C/D cup-to-disc ratio; HVF humphrey visual field; GVF goldmann visual field; OCT optical coherence tomography; IOP intraocular pressure; BRVO Branch retinal vein occlusion; CRVO central retinal vein occlusion; CRAO central retinal artery occlusion; BRAO branch retinal artery occlusion; RT retinal tear; SB scleral buckle; PPV pars plana vitrectomy; VH Vitreous hemorrhage; PRP panretinal laser photocoagulation; IVK intravitreal kenalog; VMT vitreomacular traction; MH Macular hole;  NVD neovascularization of the disc; NVE neovascularization elsewhere; AREDS age related eye disease study; ARMD age related macular degeneration; POAG primary open angle glaucoma; EBMD epithelial/anterior basement membrane dystrophy; ACIOL anterior chamber intraocular lens; IOL intraocular lens; PCIOL posterior chamber intraocular lens; Phaco/IOL phacoemulsification with intraocular lens placement; [place] photorefractive keratectomy; LASIK laser assisted in situ keratomileusis; HTN hypertension; DM diabetes mellitus; COPD chronic obstructive pulmonary disease

## 2019-10-29 ENCOUNTER — Encounter (INDEPENDENT_AMBULATORY_CARE_PROVIDER_SITE_OTHER): Payer: Self-pay | Admitting: Ophthalmology

## 2019-10-29 ENCOUNTER — Other Ambulatory Visit: Payer: Self-pay

## 2019-10-29 ENCOUNTER — Ambulatory Visit (INDEPENDENT_AMBULATORY_CARE_PROVIDER_SITE_OTHER): Payer: Medicare HMO | Admitting: Ophthalmology

## 2019-10-29 DIAGNOSIS — H04123 Dry eye syndrome of bilateral lacrimal glands: Secondary | ICD-10-CM | POA: Diagnosis not present

## 2019-10-29 DIAGNOSIS — Z961 Presence of intraocular lens: Secondary | ICD-10-CM

## 2019-10-29 DIAGNOSIS — H35372 Puckering of macula, left eye: Secondary | ICD-10-CM

## 2019-10-29 DIAGNOSIS — H3581 Retinal edema: Secondary | ICD-10-CM | POA: Diagnosis not present

## 2019-10-29 DIAGNOSIS — H353132 Nonexudative age-related macular degeneration, bilateral, intermediate dry stage: Secondary | ICD-10-CM | POA: Diagnosis not present

## 2019-12-11 DIAGNOSIS — R69 Illness, unspecified: Secondary | ICD-10-CM | POA: Diagnosis not present

## 2019-12-11 DIAGNOSIS — E538 Deficiency of other specified B group vitamins: Secondary | ICD-10-CM | POA: Diagnosis not present

## 2019-12-11 DIAGNOSIS — R030 Elevated blood-pressure reading, without diagnosis of hypertension: Secondary | ICD-10-CM | POA: Diagnosis not present

## 2019-12-11 DIAGNOSIS — M810 Age-related osteoporosis without current pathological fracture: Secondary | ICD-10-CM | POA: Diagnosis not present

## 2019-12-11 DIAGNOSIS — R42 Dizziness and giddiness: Secondary | ICD-10-CM | POA: Diagnosis not present

## 2019-12-11 DIAGNOSIS — I10 Essential (primary) hypertension: Secondary | ICD-10-CM | POA: Diagnosis not present

## 2019-12-11 DIAGNOSIS — K219 Gastro-esophageal reflux disease without esophagitis: Secondary | ICD-10-CM | POA: Diagnosis not present

## 2020-01-22 DIAGNOSIS — H02834 Dermatochalasis of left upper eyelid: Secondary | ICD-10-CM | POA: Diagnosis not present

## 2020-01-22 DIAGNOSIS — H02831 Dermatochalasis of right upper eyelid: Secondary | ICD-10-CM | POA: Diagnosis not present

## 2020-01-22 DIAGNOSIS — H02835 Dermatochalasis of left lower eyelid: Secondary | ICD-10-CM | POA: Diagnosis not present

## 2020-01-22 DIAGNOSIS — H02423 Myogenic ptosis of bilateral eyelids: Secondary | ICD-10-CM | POA: Diagnosis not present

## 2020-01-22 DIAGNOSIS — H02832 Dermatochalasis of right lower eyelid: Secondary | ICD-10-CM | POA: Diagnosis not present

## 2020-01-28 DIAGNOSIS — Z0100 Encounter for examination of eyes and vision without abnormal findings: Secondary | ICD-10-CM | POA: Diagnosis not present

## 2020-02-05 DIAGNOSIS — E538 Deficiency of other specified B group vitamins: Secondary | ICD-10-CM | POA: Diagnosis not present

## 2020-02-05 DIAGNOSIS — R69 Illness, unspecified: Secondary | ICD-10-CM | POA: Diagnosis not present

## 2020-02-05 DIAGNOSIS — Q210 Ventricular septal defect: Secondary | ICD-10-CM | POA: Diagnosis not present

## 2020-02-05 DIAGNOSIS — M810 Age-related osteoporosis without current pathological fracture: Secondary | ICD-10-CM | POA: Diagnosis not present

## 2020-02-05 DIAGNOSIS — R809 Proteinuria, unspecified: Secondary | ICD-10-CM | POA: Diagnosis not present

## 2020-02-05 DIAGNOSIS — I7090 Unspecified atherosclerosis: Secondary | ICD-10-CM | POA: Diagnosis not present

## 2020-02-05 DIAGNOSIS — D72829 Elevated white blood cell count, unspecified: Secondary | ICD-10-CM | POA: Diagnosis not present

## 2020-02-05 DIAGNOSIS — I10 Essential (primary) hypertension: Secondary | ICD-10-CM | POA: Diagnosis not present

## 2020-02-05 DIAGNOSIS — Z0000 Encounter for general adult medical examination without abnormal findings: Secondary | ICD-10-CM | POA: Diagnosis not present

## 2020-02-05 DIAGNOSIS — G629 Polyneuropathy, unspecified: Secondary | ICD-10-CM | POA: Diagnosis not present

## 2020-02-05 DIAGNOSIS — M419 Scoliosis, unspecified: Secondary | ICD-10-CM | POA: Diagnosis not present

## 2020-02-21 ENCOUNTER — Encounter (HOSPITAL_COMMUNITY): Payer: Medicare HMO

## 2020-02-25 ENCOUNTER — Encounter (HOSPITAL_COMMUNITY): Payer: Medicare HMO

## 2020-03-02 ENCOUNTER — Other Ambulatory Visit (HOSPITAL_COMMUNITY): Payer: Self-pay

## 2020-03-03 ENCOUNTER — Other Ambulatory Visit: Payer: Self-pay

## 2020-03-03 ENCOUNTER — Encounter (HOSPITAL_COMMUNITY)
Admission: RE | Admit: 2020-03-03 | Discharge: 2020-03-03 | Disposition: A | Discharge status: Home / Self Care | Payer: Medicare HMO | Source: Ambulatory Visit | Attending: Internal Medicine | Admitting: Internal Medicine

## 2020-03-03 DIAGNOSIS — M810 Age-related osteoporosis without current pathological fracture: Secondary | ICD-10-CM | POA: Diagnosis not present

## 2020-03-03 MED ORDER — DENOSUMAB 60 MG/ML ~~LOC~~ SOSY
PREFILLED_SYRINGE | SUBCUTANEOUS | Status: AC
  Filled 2020-03-03: qty 1

## 2020-03-03 MED ORDER — DENOSUMAB 60 MG/ML ~~LOC~~ SOSY
60.0000 mg | PREFILLED_SYRINGE | Freq: Once | SUBCUTANEOUS | Status: AC
  Administered 2020-03-03: 60 mg via SUBCUTANEOUS

## 2020-03-04 DIAGNOSIS — Z1231 Encounter for screening mammogram for malignant neoplasm of breast: Secondary | ICD-10-CM | POA: Diagnosis not present

## 2020-03-04 DIAGNOSIS — M85852 Other specified disorders of bone density and structure, left thigh: Secondary | ICD-10-CM | POA: Diagnosis not present

## 2020-03-04 DIAGNOSIS — M85851 Other specified disorders of bone density and structure, right thigh: Secondary | ICD-10-CM | POA: Diagnosis not present

## 2020-03-09 DIAGNOSIS — I10 Essential (primary) hypertension: Secondary | ICD-10-CM | POA: Diagnosis not present

## 2020-03-09 DIAGNOSIS — R82998 Other abnormal findings in urine: Secondary | ICD-10-CM | POA: Diagnosis not present

## 2020-03-16 DIAGNOSIS — R69 Illness, unspecified: Secondary | ICD-10-CM | POA: Diagnosis not present

## 2020-03-19 DIAGNOSIS — R69 Illness, unspecified: Secondary | ICD-10-CM | POA: Diagnosis not present

## 2020-04-16 NOTE — Progress Notes (Signed)
Cardiology Office Note   Date:  04/17/2020   ID:  Michelle Keith, DOB 04-06-1932, MRN [id number]  PCP:  Michelle Infante, MD Cardiologist:   Minus Breeding, MD   Chief Complaint  Patient presents with  . Palpitations     History of Present Illness: Michelle Keith is a 86 y.o. female who presents for follow up of chest pain. She did have a history of syncope. She's had a workup that has included a negative stress perfusion study in 2016. Of note there is mention of a possible VSD in her primary care notes. However, I reviewed the echo results in 2007 and don't see mention of this. She has  had palpitations and has been intolerant of calcium channel blockers and beta blockers.     She wore a Holter in  2017.  There were no significant arrhythmias.  Since the last visit she she had several episodes where she would be waking up from a nap and she would feel sweating of the back of her neck.  She was not quite sure what this was.  She had occasional palpitations with these "sick spells."  However, she is not had any of this in 6 weeks.  She is not had any presyncope or syncope.  She has had no chest pressure, neck or arm discomfort.  She had no weight gain or edema.  Of note she has had a couple of high blood pressure readings but for the most part has been well controlled.  She tolerates a low-dose of beta-blocker.  Past Medical History:  Diagnosis Date  . Dysautonomia ([place])   . Esophageal stricture   . GERD (gastroesophageal reflux disease)   . Hiatal hernia   . IBS (irritable bowel syndrome)   . Osteopenia   . Osteoporosis   . Right thyroid nodule   . Squamous cell skin cancer   . Syncope     Past Surgical History:  Procedure Laterality Date  . C-EYE SURGERY PROCEDURE     30 yrs ago  . Cataract extraction    . CATARACT EXTRACTION  2001  . COLONOSCOPY  2009   Also 2002  . ESOPHAGEAL DILATION     x 4  . ESOPHAGEAL MANOMETRY    . ESOPHAGOGASTRODUODENOSCOPY  Multiple  .  Exploratory laparoscopy    . EYE SURGERY    . NISSEN FUNDOPLICATION    . TOTAL ABDOMINAL HYSTERECTOMY       Current Outpatient Medications  Medication Sig Dispense Refill  . Calcium-Vitamin D-Vitamin K (VIACTIV PO) Take 1 tablet by mouth 2 (two) times daily.    . cholecalciferol (VITAMIN D) 1000 UNITS tablet Take 2,000 Units by mouth daily.     . cholecalciferol (VITAMIN D) 1000 units tablet Vitamin D    . denosumab (PROLIA) 60 MG/ML SOSY injection Inject 60 mg into the skin once. 2 times a year    . metoprolol tartrate (LOPRESSOR) 25 MG tablet Pt takes 1 1/2 tablet by mouth daily    . Multiple Vitamins-Minerals (PRESERVISION AREDS 2 PO) Take by mouth.    . Multiple Vitamins-Minerals (PRESERVISION AREDS 2+MULTI VIT PO) PreserVision AREDS    . pantoprazole (PROTONIX) 40 MG tablet Take 40 mg by mouth daily as needed (heartburn). Patient states she takes it every other day prn    . valACYclovir (VALTREX) 500 MG tablet Take 500 mg by mouth 2 (two) times daily.    Marland Kitchen zolpidem (AMBIEN) 10 MG tablet Take 2.5 mg by mouth daily  as needed for sleep.      No current facility-administered medications for this visit.    Allergies:   Epinephrine, Lidocaine, Aspirin, Atenolol, Clarithromycin, Cortisone, Omeprazole magnesium, Penicillins, Pindolol, Pseudoephedrine-guaifenesin, Sulfonamide derivatives, Tetracycline, Glycol stearate, and Valsartan    ROS:  Please see the history of present illness.   Otherwise, review of systems are positive for none.  All other systems are reviewed and negative.    PHYSICAL EXAM: VS:  BP (!) 142/76   Pulse 60   Ht 5\' 4"  (1.626 m)   Wt 129 lb (58.5 kg)   SpO2 100%   BMI 22.14 kg/m  , BMI Body mass index is 22.14 kg/m.  GENERAL:  Well appearing NECK:  No jugular venous distention, waveform within normal limits, carotid upstroke brisk and symmetric, no bruits, no thyromegaly LUNGS:  Clear to auscultation bilaterally CHEST:  Unremarkable HEART:  PMI not displaced  or sustained,S1 and S2 within normal limits, no S3, no S4, no clicks, no rubs, no murmurs ABD:  Flat, positive bowel sounds normal in frequency in pitch, no bruits, no rebound, no guarding, no midline pulsatile mass, no hepatomegaly, no splenomegaly EXT:  2 plus pulses throughout, no edema, no cyanosis no clubbing   EKG:  EKG is  ordered today. Sinus rhythm, rate 60, axis within normal limits, intervals within normal limits, no acute ST-T wave changes.  Recent Labs: No results found for requested labs within last 8760 hours.    Lipid Panel No results found for: CHOL, TRIG, HDL, CHOLHDL, VLDL, LDLCALC, LDLDIRECT    Wt Readings from Last 3 Encounters:  04/17/20 129 lb (58.5 kg)  07/19/18 124 lb 9.6 oz (56.5 kg)  07/17/17 128 lb (58.1 kg)    Lab Results  Component Value Date   TSH 3.05 01/13/2016     Other studies Reviewed: Additional studies/ records that were reviewed today include: None Review of the above records demonstrates:    NA   ASSESSMENT AND PLAN:   PALPITATIONS:     The patient's not particularly bothered by palpitations.  No change in therapy.  HTN:   Her blood pressure is a little bit elevated.  However, she is very sensitive to medications.  She knows how to bring it down by relaxing and going for walks.  No change in therapy is suggested.   Current medicines are reviewed at length with the patient today.  The patient does not have concerns regarding medicines.  The following changes have been made:   None  Labs/ tests ordered today include:    None  Orders Placed This Encounter  Procedures  . EKG 12-Lead     Disposition:   FU with me in 12 months.     Signed, Minus Breeding, MD  04/17/2020 11:24 AM    [facility]

## 2020-04-17 ENCOUNTER — Ambulatory Visit: Payer: Medicare HMO | Admitting: Cardiology

## 2020-04-17 ENCOUNTER — Other Ambulatory Visit: Payer: Self-pay

## 2020-04-17 ENCOUNTER — Encounter: Payer: Self-pay | Admitting: Cardiology

## 2020-04-17 VITALS — BP 142/76 | HR 60 | Ht 64.0 in | Wt 129.0 lb

## 2020-04-17 DIAGNOSIS — R002 Palpitations: Secondary | ICD-10-CM

## 2020-04-17 DIAGNOSIS — I10 Essential (primary) hypertension: Secondary | ICD-10-CM

## 2020-04-17 NOTE — Patient Instructions (Signed)
Medication Instructions:  No changes *If you need a refill on your cardiac medications before your next appointment, please call your pharmacy*   Lab Work: None ordered If you have labs (blood work) drawn today and your tests are completely normal, you will receive your results only by: . MyChart Message (if you have MyChart) OR . A paper copy in the mail If you have any lab test that is abnormal or we need to change your treatment, we will call you to review the results.   Testing/Procedures: None ordered   Follow-Up: At [facility], you and your health needs are our priority.  As part of our continuing mission to provide you with exceptional heart care, we have created designated Provider Care Teams.  These Care Teams include your primary Cardiologist (physician) and Advanced Practice Providers (APPs -  Physician Assistants and Nurse Practitioners) who all work together to provide you with the care you need, when you need it.  We recommend signing up for the patient portal called "MyChart".  Sign up information is provided on this After Visit Summary.  MyChart is used to connect with patients for Virtual Visits (Telemedicine).  Patients are able to view lab/test results, encounter notes, upcoming appointments, etc.  Non-urgent messages can be sent to your provider as well.   To learn more about what you can do with MyChart, go to [email or web address].    Your next appointment:   12 month(s)  The format for your next appointment:   In Person  Provider:   James Hochrein, MD   Other Instructions None   

## 2020-04-24 NOTE — Progress Notes (Signed)
[facility] Note  04/28/2020     CHIEF COMPLAINT Patient presents for Retina Follow Up   HISTORY OF PRESENT ILLNESS: Michelle Keith is a 86 y.o. female who presents to the clinic today for:   HPI    Retina Follow Up    Patient presents with  Other.  In left eye.  This started months ago.  Severity is moderate.  Duration of 6 months.  Since onset it is stable.  I, the attending physician,  performed the HPI with the patient and updated documentation appropriately.          Comments    86 y/o female pt here for 6 mo f/u for ERM OS.  Pt feels VA OS not quite as good as at last visit.  Denies pain, FOL, floaters.  AT and warm compresses prn OU.       Last edited by Bernarda Caffey, MD on 04/28/2020  1:19 PM. (History)    pt feels like her left eye vision is not as good as it was last time she was here, she has gotten a new glasses rx since last visit, pt saw Dr. Heide Spark at [facility] and is scheduled for blepharoplasty in January  Referring physician: Calvert Cantor, MD [address] [place],  [place] [address]  HISTORICAL INFORMATION:   Selected notes from the MEDICAL RECORD NUMBER Referred by Dr. Bing Plume for evaluation of AMD OU   CURRENT MEDICATIONS: No current outpatient medications on file. (Ophthalmic Drugs)   No current facility-administered medications for this visit. (Ophthalmic Drugs)   Current Outpatient Medications (Other)  Medication Sig  . Calcium Carb-Cholecalciferol 600-400 MG-UNIT CAPS Take by mouth.  . Calcium-Vitamin D-Vitamin K (VIACTIV PO) Take 1 tablet by mouth 2 (two) times daily.  . cholecalciferol (VITAMIN D) 1000 UNITS tablet Take 2,000 Units by mouth daily.   . cholecalciferol (VITAMIN D) 1000 units tablet Vitamin D  . conjugated estrogens (PREMARIN) vaginal cream Premarin 0.625 mg/gram vaginal cream  Insert 0.5 g every 3 weeks by vaginal route.  Marland Kitchen denosumab (PROLIA) 60 MG/ML SOSY injection Inject 60 mg into the skin  once. 2 times a year  . meclizine (ANTIVERT) 25 MG tablet meclizine 25 mg tablet  TAKE 1 TABLET BY MOUTH TWICE DAILY AS NEEDED FOR VERTIGO  . metoprolol tartrate (LOPRESSOR) 25 MG tablet Pt takes 1 1/2 tablet by mouth daily  . Multiple Vitamins-Minerals (PRESERVISION AREDS 2 PO) Take by mouth.  . Multiple Vitamins-Minerals (PRESERVISION AREDS 2+MULTI VIT PO) PreserVision AREDS  . Omega-3 Fatty Acids (FISH OIL) 1000 MG CAPS Take by mouth.  . pantoprazole (PROTONIX) 40 MG tablet Take 40 mg by mouth daily as needed (heartburn). Patient states she takes it every other day prn  . valACYclovir (VALTREX) 500 MG tablet Take 500 mg by mouth 2 (two) times daily.  Marland Kitchen zolpidem (AMBIEN) 10 MG tablet Take 2.5 mg by mouth daily as needed for sleep.    No current facility-administered medications for this visit. (Other)      REVIEW OF SYSTEMS: ROS    Positive for: Gastrointestinal, Musculoskeletal, Cardiovascular, Eyes   Negative for: Constitutional, Neurological, Skin, Genitourinary, HENT, Endocrine, Respiratory, Psychiatric, Allergic/Imm, Heme/Lymph   Last edited by Matthew Folks, COA on 04/28/2020 12:59 PM. (History)       ALLERGIES Allergies  Allergen Reactions  . Epinephrine Anaphylaxis  . Lidocaine Anaphylaxis  . Aspirin   . Atenolol   . Clarithromycin   . Cortisone Other (  See Comments)  . Omeprazole Magnesium Other (See Comments)    Ask at office visit  . Penicillins   . Pindolol Diarrhea    Fatigue, hip pain  . Pseudoephedrine-Guaifenesin Other (See Comments)  . Sulfonamide Derivatives   . Tetracycline Hives  . Glycol Stearate Palpitations  . Valsartan Palpitations    PAST MEDICAL HISTORY Past Medical History:  Diagnosis Date  . Dysautonomia ([place])   . Esophageal stricture   . GERD (gastroesophageal reflux disease)   . Hiatal hernia   . IBS (irritable bowel syndrome)   . Macular degeneration    Dry OU  . Osteopenia   . Osteoporosis   . Right thyroid nodule   .  Squamous cell skin cancer   . Syncope    Past Surgical History:  Procedure Laterality Date  . C-EYE SURGERY PROCEDURE     30 yrs ago  . Cataract extraction    . CATARACT EXTRACTION Bilateral 2001   Dr. Herbert Deaner  . COLONOSCOPY  2009   Also 2002  . ESOPHAGEAL DILATION     x 4  . ESOPHAGEAL MANOMETRY    . ESOPHAGOGASTRODUODENOSCOPY  Multiple  . Exploratory laparoscopy    . EYE SURGERY Bilateral    Cat Sx - Dr. Herbert Deaner  . NISSEN FUNDOPLICATION    . TOTAL ABDOMINAL HYSTERECTOMY      FAMILY HISTORY Family History  Problem Relation Age of Onset  . Multiple myeloma Father        died age 63  . Heart failure Mother        died age 97    SOCIAL HISTORY Social History   Tobacco Use  . Smoking status: Never Smoker  . Smokeless tobacco: Never Used  Vaping Use  . Vaping Use: Never used  Substance Use Topics  . Alcohol use: No    Alcohol/week: 0.0 standard drinks  . Drug use: No         OPHTHALMIC EXAM:  Base Eye Exam    Visual Acuity (Snellen - Linear)      Right Left   Dist [place] 20/20 20/40 -   Dist ph [place]  NI       Tonometry (Tonopen, 1:03 PM)      Right Left   Pressure 13 12       Pupils      Dark Light Shape React APD   Right 3 2 Round Brisk None   Left 3 2 Round Brisk None       Visual Fields (Counting fingers)      Left Right    Full Full       Extraocular Movement      Right Left    Full, Ortho Full, Ortho       Neuro/Psych    Oriented x3: Yes   Mood/Affect: Normal       Dilation    Both eyes: 1.0% Mydriacyl, 2.5% Phenylephrine @ 1:03 PM        Slit Lamp and Fundus Exam    External Exam      Right Left   External Normal Normal       Slit Lamp Exam      Right Left   Lids/Lashes Dermatochalasis - upper lid Dermatochalasis - upper lid   Conjunctiva/Sclera White and quiet White and quiet   Cornea arcus, 1+ inferior peripheral Punctate epithelial erosions, trace Debris in tear film arcus;1-2+Punctate epithelial erosions, mild Debris in  tear film   Anterior Chamber Deep and quiet Deep  and quiet   Iris Round, dilated Round, dilated   Lens PCIOL; open PC PCIOL; 1+ PCO non central   Vitreous Vitreous syneresis, PVD, vitreous condensations Vitreous syneresis, PVD, vitreous condensations       Fundus Exam      Right Left   Disc compact, trace Peripapillary atrophy inferiorly, Pink and Sharp compact, Pink and Sharp   C/D Ratio 0.1 0.1   Macula Flat, Blunted foveal reflex, RPE mottling and clumping; drusen; no heme or edema flat, Blunted foveal reflex, RPE mottling and clumping, ERM - stable; drusen; no heme or edema   Vessels mild attenuated, Tortuousity attenuated, Tortuous   Periphery Attached, cobblestoning at 0700, mild Reticular degeneration attached          IMAGING AND PROCEDURES  Imaging and Procedures for 08/29/17  OCT, Retina - OU - Both Eyes       Right Eye Quality was good. Central Foveal Thickness: 342. Progression has been stable. Findings include no IRF, no SRF, retinal drusen , abnormal foveal contour (Persistent drusen - stable from prior).   Left Eye Quality was good. Central Foveal Thickness: 392. Progression has been stable. Findings include no IRF, no SRF, abnormal foveal contour, retinal drusen , epiretinal membrane.   Notes Images taken, stored on drive  Diagnosis / Impression:  nonexudative ARMD OU -- persistent drusen -- stable ERM OS -- mild -- stable from prior  Clinical management:  See below  Abbreviations: NFP - Normal foveal profile. CME - cystoid macular edema. PED - pigment epithelial detachment. IRF - intraretinal fluid. SRF - subretinal fluid. EZ - ellipsoid zone. ERM - epiretinal membrane. ORA - outer retinal atrophy. ORT - outer retinal tubulation. SRHM - subretinal hyper-reflective material                  ASSESSMENT/PLAN:    ICD-10-CM   1. Intermediate stage nonexudative age-related macular degeneration of both eyes  H35.3132   2. Retinal edema  H35.81 OCT,  Retina - OU - Both Eyes  3. Epiretinal membrane (ERM) of left eye  H35.372   4. Pseudophakia of both eyes  Z96.1   5. Dry eyes  H04.123     1,2. Age related macular degeneration, non-exudative, both eyes  - mild-intermediate stage OU -- stable drusen on OCT  - The incidence, anatomy, and pathology of dry AMD, risk of progression, and the AREDS and AREDS 2 study including smoking risks discussed with patient.  - continue amsler grid monitoring  - recommend f/u 6-9 months, sooner prn -- pt requests [id number] -- okay  3. Epiretinal membrane, left eye   - very mild ERM, BCVA 20/40 with minimal metamorphopsia OS  - OCT and exam stable from prior -- reviewed all OCTs dating back to 2018 -- no significant change  - no surgical intervention indicated at this time  - monitor  - f/u in 6 mos  4. Pseudophakia OU  - s/p CE/IOL OU w/ Dr. HHerbert Deaner - beautiful surgeries, doing well  - monitor  5. Dry Eyes OU  - BCVA remains 20/20 OD, OS stable at 20/40  - exam shows persistent PEE (1+), ABMD, decreased TBUT and tear film debris  - recommend hot compresses OU BID  - recommend artificial tears and lubricating ointment as needed   Ophthalmic Meds Ordered this visit:  No orders of the defined types were placed in this encounter.      Return in about 6 months (around 10/26/2020) for f/u exu ARMD OU,  DFE, OCT.  There are no Patient Instructions on file for this visit.  This document serves as a record of services personally performed by Gardiner Sleeper, MD, PhD. It was created on their behalf by Estill Bakes, COT an ophthalmic technician. The creation of this record is the provider's dictation and/or activities during the visit.    Electronically signed by: Estill Bakes, COT 11.5.21 @ 1:33 AM   This document serves as a record of services personally performed by Gardiner Sleeper, MD, PhD. It was created on their behalf by San Jetty. Owens Shark, OA an ophthalmic technician. The creation of this  record is the provider's dictation and/or activities during the visit.    Electronically signed by: San Jetty. Owens Shark, [place] 11.09.2021 1:33 AM  Gardiner Sleeper, M.D., Ph.D. Diseases & Surgery of the Retina and [place] 04/28/2020   I have reviewed the above documentation for accuracy and completeness, and I agree with the above. Gardiner Sleeper, M.D., Ph.D. 05/03/20 1:33 AM   Abbreviations: M myopia (nearsighted); A astigmatism; H hyperopia (farsighted); P presbyopia; Mrx spectacle prescription;  CTL contact lenses; OD right eye; OS left eye; OU both eyes  XT exotropia; ET esotropia; PEK punctate epithelial keratitis; PEE punctate epithelial erosions; DES dry eye syndrome; MGD meibomian gland dysfunction; ATs artificial tears; PFAT's preservative free artificial tears; [place] nuclear sclerotic cataract; PSC posterior subcapsular cataract; ERM epi-retinal membrane; PVD posterior vitreous detachment; RD retinal detachment; DM diabetes mellitus; DR diabetic retinopathy; NPDR non-proliferative diabetic retinopathy; PDR proliferative diabetic retinopathy; CSME clinically significant macular edema; DME diabetic macular edema; dbh dot blot hemorrhages; CWS cotton wool spot; POAG primary open angle glaucoma; C/D cup-to-disc ratio; HVF humphrey visual field; GVF goldmann visual field; OCT optical coherence tomography; IOP intraocular pressure; BRVO Branch retinal vein occlusion; CRVO central retinal vein occlusion; CRAO central retinal artery occlusion; BRAO branch retinal artery occlusion; RT retinal tear; SB scleral buckle; PPV pars plana vitrectomy; VH Vitreous hemorrhage; PRP panretinal laser photocoagulation; IVK intravitreal kenalog; VMT vitreomacular traction; MH Macular hole;  NVD neovascularization of the disc; NVE neovascularization elsewhere; AREDS age related eye disease study; ARMD age related macular degeneration; POAG primary open angle glaucoma; EBMD  epithelial/anterior basement membrane dystrophy; ACIOL anterior chamber intraocular lens; IOL intraocular lens; PCIOL posterior chamber intraocular lens; Phaco/IOL phacoemulsification with intraocular lens placement; [place] photorefractive keratectomy; LASIK laser assisted in situ keratomileusis; HTN hypertension; DM diabetes mellitus; COPD chronic obstructive pulmonary disease

## 2020-04-28 ENCOUNTER — Ambulatory Visit (INDEPENDENT_AMBULATORY_CARE_PROVIDER_SITE_OTHER): Payer: Medicare HMO | Admitting: Ophthalmology

## 2020-04-28 ENCOUNTER — Other Ambulatory Visit: Payer: Self-pay

## 2020-04-28 ENCOUNTER — Encounter (INDEPENDENT_AMBULATORY_CARE_PROVIDER_SITE_OTHER): Payer: Self-pay | Admitting: Ophthalmology

## 2020-04-28 DIAGNOSIS — H3581 Retinal edema: Secondary | ICD-10-CM | POA: Diagnosis not present

## 2020-04-28 DIAGNOSIS — H353132 Nonexudative age-related macular degeneration, bilateral, intermediate dry stage: Secondary | ICD-10-CM | POA: Diagnosis not present

## 2020-04-28 DIAGNOSIS — H04123 Dry eye syndrome of bilateral lacrimal glands: Secondary | ICD-10-CM | POA: Diagnosis not present

## 2020-04-28 DIAGNOSIS — Z961 Presence of intraocular lens: Secondary | ICD-10-CM | POA: Diagnosis not present

## 2020-04-28 DIAGNOSIS — H35372 Puckering of macula, left eye: Secondary | ICD-10-CM | POA: Diagnosis not present

## 2020-05-22 DIAGNOSIS — M419 Scoliosis, unspecified: Secondary | ICD-10-CM | POA: Diagnosis not present

## 2020-05-22 DIAGNOSIS — E538 Deficiency of other specified B group vitamins: Secondary | ICD-10-CM | POA: Diagnosis not present

## 2020-05-22 DIAGNOSIS — I951 Orthostatic hypotension: Secondary | ICD-10-CM | POA: Diagnosis not present

## 2020-05-22 DIAGNOSIS — R69 Illness, unspecified: Secondary | ICD-10-CM | POA: Diagnosis not present

## 2020-05-22 DIAGNOSIS — H04129 Dry eye syndrome of unspecified lacrimal gland: Secondary | ICD-10-CM | POA: Diagnosis not present

## 2020-05-22 DIAGNOSIS — I10 Essential (primary) hypertension: Secondary | ICD-10-CM | POA: Diagnosis not present

## 2020-05-22 DIAGNOSIS — I7090 Unspecified atherosclerosis: Secondary | ICD-10-CM | POA: Diagnosis not present

## 2020-05-22 DIAGNOSIS — K449 Diaphragmatic hernia without obstruction or gangrene: Secondary | ICD-10-CM | POA: Diagnosis not present

## 2020-05-22 DIAGNOSIS — M810 Age-related osteoporosis without current pathological fracture: Secondary | ICD-10-CM | POA: Diagnosis not present

## 2020-07-20 ENCOUNTER — Other Ambulatory Visit: Payer: Self-pay | Admitting: Family Medicine

## 2020-07-20 ENCOUNTER — Ambulatory Visit
Admission: RE | Admit: 2020-07-20 | Discharge: 2020-07-20 | Disposition: A | Discharge status: Home / Self Care | Payer: Medicare HMO | Source: Ambulatory Visit | Attending: Family Medicine | Admitting: Family Medicine

## 2020-07-20 ENCOUNTER — Other Ambulatory Visit: Payer: Self-pay

## 2020-07-20 DIAGNOSIS — M542 Cervicalgia: Secondary | ICD-10-CM

## 2020-07-20 DIAGNOSIS — D72829 Elevated white blood cell count, unspecified: Secondary | ICD-10-CM | POA: Diagnosis not present

## 2020-07-20 DIAGNOSIS — R221 Localized swelling, mass and lump, neck: Secondary | ICD-10-CM | POA: Diagnosis not present

## 2020-07-27 ENCOUNTER — Other Ambulatory Visit: Payer: Self-pay | Admitting: Internal Medicine

## 2020-07-27 DIAGNOSIS — M5030 Other cervical disc degeneration, unspecified cervical region: Secondary | ICD-10-CM

## 2020-08-19 ENCOUNTER — Encounter (HOSPITAL_COMMUNITY): Payer: Medicare HMO

## 2020-08-21 ENCOUNTER — Other Ambulatory Visit: Payer: Self-pay

## 2020-08-21 ENCOUNTER — Ambulatory Visit
Admission: RE | Admit: 2020-08-21 | Discharge: 2020-08-21 | Disposition: A | Discharge status: Home / Self Care | Payer: Medicare HMO | Source: Ambulatory Visit | Attending: Internal Medicine | Admitting: Internal Medicine

## 2020-08-21 DIAGNOSIS — M5030 Other cervical disc degeneration, unspecified cervical region: Secondary | ICD-10-CM

## 2020-08-21 DIAGNOSIS — M4802 Spinal stenosis, cervical region: Secondary | ICD-10-CM | POA: Diagnosis not present

## 2020-08-25 ENCOUNTER — Other Ambulatory Visit (HOSPITAL_COMMUNITY): Payer: Self-pay | Admitting: *Deleted

## 2020-08-26 ENCOUNTER — Ambulatory Visit (HOSPITAL_COMMUNITY)
Admission: RE | Admit: 2020-08-26 | Discharge: 2020-08-26 | Disposition: A | Discharge status: Home / Self Care | Payer: Medicare HMO | Source: Ambulatory Visit | Attending: Internal Medicine | Admitting: Internal Medicine

## 2020-08-26 DIAGNOSIS — M810 Age-related osteoporosis without current pathological fracture: Secondary | ICD-10-CM | POA: Insufficient documentation

## 2020-08-26 MED ORDER — DENOSUMAB 60 MG/ML ~~LOC~~ SOSY: 60.0000 mg | PREFILLED_SYRINGE | Freq: Once | SUBCUTANEOUS | Status: AC

## 2020-08-26 MED ORDER — DENOSUMAB 60 MG/ML ~~LOC~~ SOSY
PREFILLED_SYRINGE | SUBCUTANEOUS | Status: AC
  Administered 2020-08-26: 60 mg via SUBCUTANEOUS
  Filled 2020-08-26: qty 1

## 2020-09-07 DIAGNOSIS — G629 Polyneuropathy, unspecified: Secondary | ICD-10-CM | POA: Diagnosis not present

## 2020-09-07 DIAGNOSIS — M8580 Other specified disorders of bone density and structure, unspecified site: Secondary | ICD-10-CM | POA: Diagnosis not present

## 2020-09-07 DIAGNOSIS — M542 Cervicalgia: Secondary | ICD-10-CM | POA: Diagnosis not present

## 2020-09-07 DIAGNOSIS — I10 Essential (primary) hypertension: Secondary | ICD-10-CM | POA: Diagnosis not present

## 2020-09-14 DIAGNOSIS — M4802 Spinal stenosis, cervical region: Secondary | ICD-10-CM | POA: Diagnosis not present

## 2020-09-14 DIAGNOSIS — M2560 Stiffness of unspecified joint, not elsewhere classified: Secondary | ICD-10-CM | POA: Diagnosis not present

## 2020-09-14 DIAGNOSIS — M6281 Muscle weakness (generalized): Secondary | ICD-10-CM | POA: Diagnosis not present

## 2020-09-14 DIAGNOSIS — M542 Cervicalgia: Secondary | ICD-10-CM | POA: Diagnosis not present

## 2020-09-17 DIAGNOSIS — M4802 Spinal stenosis, cervical region: Secondary | ICD-10-CM | POA: Diagnosis not present

## 2020-09-17 DIAGNOSIS — M2560 Stiffness of unspecified joint, not elsewhere classified: Secondary | ICD-10-CM | POA: Diagnosis not present

## 2020-09-17 DIAGNOSIS — M542 Cervicalgia: Secondary | ICD-10-CM | POA: Diagnosis not present

## 2020-09-17 DIAGNOSIS — M6281 Muscle weakness (generalized): Secondary | ICD-10-CM | POA: Diagnosis not present

## 2020-09-21 DIAGNOSIS — M4802 Spinal stenosis, cervical region: Secondary | ICD-10-CM | POA: Diagnosis not present

## 2020-09-21 DIAGNOSIS — M542 Cervicalgia: Secondary | ICD-10-CM | POA: Diagnosis not present

## 2020-09-21 DIAGNOSIS — M6281 Muscle weakness (generalized): Secondary | ICD-10-CM | POA: Diagnosis not present

## 2020-09-21 DIAGNOSIS — M2560 Stiffness of unspecified joint, not elsewhere classified: Secondary | ICD-10-CM | POA: Diagnosis not present

## 2020-09-24 DIAGNOSIS — M542 Cervicalgia: Secondary | ICD-10-CM | POA: Diagnosis not present

## 2020-09-24 DIAGNOSIS — M2560 Stiffness of unspecified joint, not elsewhere classified: Secondary | ICD-10-CM | POA: Diagnosis not present

## 2020-09-24 DIAGNOSIS — M6281 Muscle weakness (generalized): Secondary | ICD-10-CM | POA: Diagnosis not present

## 2020-09-24 DIAGNOSIS — M4802 Spinal stenosis, cervical region: Secondary | ICD-10-CM | POA: Diagnosis not present

## 2020-09-28 DIAGNOSIS — M6281 Muscle weakness (generalized): Secondary | ICD-10-CM | POA: Diagnosis not present

## 2020-09-28 DIAGNOSIS — M2560 Stiffness of unspecified joint, not elsewhere classified: Secondary | ICD-10-CM | POA: Diagnosis not present

## 2020-09-28 DIAGNOSIS — M542 Cervicalgia: Secondary | ICD-10-CM | POA: Diagnosis not present

## 2020-09-28 DIAGNOSIS — M4802 Spinal stenosis, cervical region: Secondary | ICD-10-CM | POA: Diagnosis not present

## 2020-10-06 DIAGNOSIS — M4802 Spinal stenosis, cervical region: Secondary | ICD-10-CM | POA: Diagnosis not present

## 2020-10-06 DIAGNOSIS — M542 Cervicalgia: Secondary | ICD-10-CM | POA: Diagnosis not present

## 2020-10-06 DIAGNOSIS — M6281 Muscle weakness (generalized): Secondary | ICD-10-CM | POA: Diagnosis not present

## 2020-10-06 DIAGNOSIS — M2560 Stiffness of unspecified joint, not elsewhere classified: Secondary | ICD-10-CM | POA: Diagnosis not present

## 2020-10-08 DIAGNOSIS — M6281 Muscle weakness (generalized): Secondary | ICD-10-CM | POA: Diagnosis not present

## 2020-10-08 DIAGNOSIS — M2560 Stiffness of unspecified joint, not elsewhere classified: Secondary | ICD-10-CM | POA: Diagnosis not present

## 2020-10-08 DIAGNOSIS — M542 Cervicalgia: Secondary | ICD-10-CM | POA: Diagnosis not present

## 2020-10-08 DIAGNOSIS — M4802 Spinal stenosis, cervical region: Secondary | ICD-10-CM | POA: Diagnosis not present

## 2020-10-12 DIAGNOSIS — M542 Cervicalgia: Secondary | ICD-10-CM | POA: Diagnosis not present

## 2020-10-12 DIAGNOSIS — M6281 Muscle weakness (generalized): Secondary | ICD-10-CM | POA: Diagnosis not present

## 2020-10-12 DIAGNOSIS — M4802 Spinal stenosis, cervical region: Secondary | ICD-10-CM | POA: Diagnosis not present

## 2020-10-12 DIAGNOSIS — M2560 Stiffness of unspecified joint, not elsewhere classified: Secondary | ICD-10-CM | POA: Diagnosis not present

## 2020-10-15 DIAGNOSIS — M6281 Muscle weakness (generalized): Secondary | ICD-10-CM | POA: Diagnosis not present

## 2020-10-15 DIAGNOSIS — M4802 Spinal stenosis, cervical region: Secondary | ICD-10-CM | POA: Diagnosis not present

## 2020-10-15 DIAGNOSIS — M542 Cervicalgia: Secondary | ICD-10-CM | POA: Diagnosis not present

## 2020-10-15 DIAGNOSIS — M2560 Stiffness of unspecified joint, not elsewhere classified: Secondary | ICD-10-CM | POA: Diagnosis not present

## 2020-10-21 DIAGNOSIS — I10 Essential (primary) hypertension: Secondary | ICD-10-CM | POA: Diagnosis not present

## 2020-10-21 DIAGNOSIS — M542 Cervicalgia: Secondary | ICD-10-CM | POA: Diagnosis not present

## 2020-10-21 DIAGNOSIS — M5030 Other cervical disc degeneration, unspecified cervical region: Secondary | ICD-10-CM | POA: Diagnosis not present

## 2020-10-21 DIAGNOSIS — R69 Illness, unspecified: Secondary | ICD-10-CM | POA: Diagnosis not present

## 2020-10-26 DIAGNOSIS — M4802 Spinal stenosis, cervical region: Secondary | ICD-10-CM | POA: Diagnosis not present

## 2020-10-26 DIAGNOSIS — M2560 Stiffness of unspecified joint, not elsewhere classified: Secondary | ICD-10-CM | POA: Diagnosis not present

## 2020-10-26 DIAGNOSIS — M6281 Muscle weakness (generalized): Secondary | ICD-10-CM | POA: Diagnosis not present

## 2020-10-26 DIAGNOSIS — M542 Cervicalgia: Secondary | ICD-10-CM | POA: Diagnosis not present

## 2020-10-29 DIAGNOSIS — M6281 Muscle weakness (generalized): Secondary | ICD-10-CM | POA: Diagnosis not present

## 2020-10-29 DIAGNOSIS — M4802 Spinal stenosis, cervical region: Secondary | ICD-10-CM | POA: Diagnosis not present

## 2020-10-29 DIAGNOSIS — M542 Cervicalgia: Secondary | ICD-10-CM | POA: Diagnosis not present

## 2020-10-29 DIAGNOSIS — M2560 Stiffness of unspecified joint, not elsewhere classified: Secondary | ICD-10-CM | POA: Diagnosis not present

## 2020-11-05 DIAGNOSIS — M6281 Muscle weakness (generalized): Secondary | ICD-10-CM | POA: Diagnosis not present

## 2020-11-05 DIAGNOSIS — M4802 Spinal stenosis, cervical region: Secondary | ICD-10-CM | POA: Diagnosis not present

## 2020-11-05 DIAGNOSIS — M2560 Stiffness of unspecified joint, not elsewhere classified: Secondary | ICD-10-CM | POA: Diagnosis not present

## 2020-11-05 DIAGNOSIS — M542 Cervicalgia: Secondary | ICD-10-CM | POA: Diagnosis not present

## 2020-11-13 DIAGNOSIS — L821 Other seborrheic keratosis: Secondary | ICD-10-CM | POA: Diagnosis not present

## 2020-11-13 DIAGNOSIS — D1801 Hemangioma of skin and subcutaneous tissue: Secondary | ICD-10-CM | POA: Diagnosis not present

## 2020-11-13 DIAGNOSIS — L820 Inflamed seborrheic keratosis: Secondary | ICD-10-CM | POA: Diagnosis not present

## 2020-11-13 DIAGNOSIS — L218 Other seborrheic dermatitis: Secondary | ICD-10-CM | POA: Diagnosis not present

## 2020-11-17 ENCOUNTER — Telehealth: Payer: Self-pay | Admitting: Cardiology

## 2020-11-17 ENCOUNTER — Ambulatory Visit (INDEPENDENT_AMBULATORY_CARE_PROVIDER_SITE_OTHER): Payer: Medicare HMO | Admitting: Ophthalmology

## 2020-11-17 DIAGNOSIS — H35372 Puckering of macula, left eye: Secondary | ICD-10-CM | POA: Diagnosis not present

## 2020-11-17 DIAGNOSIS — Z961 Presence of intraocular lens: Secondary | ICD-10-CM | POA: Diagnosis not present

## 2020-11-17 DIAGNOSIS — H3581 Retinal edema: Secondary | ICD-10-CM | POA: Diagnosis not present

## 2020-11-17 DIAGNOSIS — H353132 Nonexudative age-related macular degeneration, bilateral, intermediate dry stage: Secondary | ICD-10-CM | POA: Diagnosis not present

## 2020-11-17 DIAGNOSIS — H04123 Dry eye syndrome of bilateral lacrimal glands: Secondary | ICD-10-CM

## 2020-11-17 NOTE — Telephone Encounter (Signed)
Spoke with pt, she reports for the last 3 weeks she has had elevated heart rate when she wakes in the morning. She reports she will be dreaming that her heart rate is elevated and then when she wakes up it will be.  She does not notice it during the day. She also is having some SOB when she first lays down to go to sleep or nap. She was offered a sooner appointment but declined because she did not want to drive at that time, she wants to wait until her follow up. Her metoprolol is once daily in the evening about 2 hours prior to going to bed. She would like to let dr hochrein know what is going on. Will forward for his review.

## 2020-11-17 NOTE — Progress Notes (Signed)
[facility] Note  11/17/2020     CHIEF COMPLAINT Patient presents for Retina Follow Up   HISTORY OF PRESENT ILLNESS: Michelle Keith is a 87 y.o. female who presents to the clinic today for:  HPI    Retina Follow Up    Patient presents with  Dry AMD.  In both eyes.  Severity is moderate.  Since onset it is stable.  I, the attending physician,  performed the HPI with the patient and updated documentation appropriately.          Comments    7 month follow up ARMD OU- Patient has noticed it is harder to read the captions on the TV.  This is without her glasses.  She only wears glasses when driving in the rain.        Last edited by Bernarda Caffey, MD on 11/20/2020  1:03 PM. (History)    Pt states she is not able to see as well without her glasses   Referring physician: Calvert Cantor, MD [address] [place],  [place] [address]  HISTORICAL INFORMATION:   Selected notes from the MEDICAL RECORD NUMBER Referred by Dr. Bing Plume for evaluation of AMD OU   CURRENT MEDICATIONS: No current outpatient medications on file. (Ophthalmic Drugs)   No current facility-administered medications for this visit. (Ophthalmic Drugs)   Current Outpatient Medications (Other)  Medication Sig  . Calcium-Vitamin D-Vitamin K (VIACTIV PO) Take 1 tablet by mouth 2 (two) times daily.  . cholecalciferol (VITAMIN D) 1000 UNITS tablet Take 2,000 Units by mouth daily.   Marland Kitchen conjugated estrogens (PREMARIN) vaginal cream Premarin 0.625 mg/gram vaginal cream  Insert 0.5 g every 3 weeks by vaginal route.  Marland Kitchen denosumab (PROLIA) 60 MG/ML SOSY injection Inject 60 mg into the skin once. 2 times a year  . meclizine (ANTIVERT) 25 MG tablet meclizine 25 mg tablet  TAKE 1 TABLET BY MOUTH TWICE DAILY AS NEEDED FOR VERTIGO  . metoprolol tartrate (LOPRESSOR) 25 MG tablet Pt takes 1 1/2 tablet by mouth daily  . Multiple Vitamins-Minerals (PRESERVISION AREDS 2 PO) Take by mouth.  .  pantoprazole (PROTONIX) 40 MG tablet Take 40 mg by mouth daily as needed (heartburn). Patient states she takes it every other day prn  . valACYclovir (VALTREX) 500 MG tablet Take 500 mg by mouth 2 (two) times daily.  Marland Kitchen zolpidem (AMBIEN) 10 MG tablet Take 2.5 mg by mouth daily as needed for sleep.   . Calcium Carb-Cholecalciferol 600-400 MG-UNIT CAPS Take by mouth.  . cholecalciferol (VITAMIN D) 1000 units tablet Vitamin D  . Multiple Vitamins-Minerals (PRESERVISION AREDS 2+MULTI VIT PO) PreserVision AREDS  . Omega-3 Fatty Acids (FISH OIL) 1000 MG CAPS Take by mouth. (Patient not taking: Reported on 11/17/2020)   No current facility-administered medications for this visit. (Other)      REVIEW OF SYSTEMS: ROS    Positive for: Gastrointestinal, Musculoskeletal, Cardiovascular, Eyes   Negative for: Constitutional, Neurological, Skin, Genitourinary, HENT, Endocrine, Respiratory, Psychiatric, Allergic/Imm, Heme/Lymph   Last edited by Leonie Douglas, COA on 11/17/2020 12:55 PM. (History)       ALLERGIES Allergies  Allergen Reactions  . Epinephrine Anaphylaxis  . Lidocaine Anaphylaxis  . Aspirin   . Atenolol   . Clarithromycin   . Cortisone Other (See Comments)  . Omeprazole Magnesium Other (See Comments)    Ask at office visit  . Penicillins   . Pindolol Diarrhea    Fatigue, hip pain  . Pseudoephedrine-Guaifenesin Other (  See Comments)  . Sulfonamide Derivatives   . Tetracycline Hives  . Glycol Stearate Palpitations  . Valsartan Palpitations    PAST MEDICAL HISTORY Past Medical History:  Diagnosis Date  . Dysautonomia ([place])   . Esophageal stricture   . GERD (gastroesophageal reflux disease)   . Hiatal hernia   . IBS (irritable bowel syndrome)   . Macular degeneration    Dry OU  . Osteopenia   . Osteoporosis   . Right thyroid nodule   . Squamous cell skin cancer   . Syncope    Past Surgical History:  Procedure Laterality Date  . C-EYE SURGERY PROCEDURE     30 yrs ago   . Cataract extraction    . CATARACT EXTRACTION Bilateral 2001   Dr. Herbert Deaner  . COLONOSCOPY  2009   Also 2002  . ESOPHAGEAL DILATION     x 4  . ESOPHAGEAL MANOMETRY    . ESOPHAGOGASTRODUODENOSCOPY  Multiple  . Exploratory laparoscopy    . EYE SURGERY Bilateral    Cat Sx - Dr. Herbert Deaner  . NISSEN FUNDOPLICATION    . TOTAL ABDOMINAL HYSTERECTOMY      FAMILY HISTORY Family History  Problem Relation Age of Onset  . Multiple myeloma Father        died age 73  . Heart failure Mother        died age 85    SOCIAL HISTORY Social History   Tobacco Use  . Smoking status: Never Smoker  . Smokeless tobacco: Never Used  Vaping Use  . Vaping Use: Never used  Substance Use Topics  . Alcohol use: No    Alcohol/week: 0.0 standard drinks  . Drug use: No         OPHTHALMIC EXAM:  Base Eye Exam    Visual Acuity (Snellen - Linear)      Right Left   Dist cc 20/20- 20/25+   Correction: Glasses       Tonometry (Tonopen, 1:09 PM)      Right Left   Pressure 17 15       Pupils      Dark Light Shape React APD   Right 3 2 Round Brisk None   Left 3 2 Round Brisk None       Visual Fields (Counting fingers)      Left Right    Full Full       Extraocular Movement      Right Left    Full Full       Neuro/Psych    Oriented x3: Yes   Mood/Affect: Normal       Dilation    Both eyes: 1.0% Mydriacyl, 2.5% Phenylephrine @ 1:09 PM        Slit Lamp and Fundus Exam    External Exam      Right Left   External Normal Normal       Slit Lamp Exam      Right Left   Lids/Lashes Dermatochalasis - upper lid Dermatochalasis - upper lid   Conjunctiva/Sclera White and quiet White and quiet   Cornea arcus, 1-2+ inferior peripheral Punctate epithelial erosions, trace Debris in tear film Arcus; 2+Punctate epithelial erosions, +Debris in tear film   Anterior Chamber Deep and quiet Deep and quiet   Iris Round, dilated Round, dilated   Lens PCIOL; open PC PCIOL; 1+ PCO non central    Vitreous Vitreous syneresis, PVD, vitreous condensations Vitreous syneresis, PVD, vitreous condensations       Fundus  Exam      Right Left   Disc compact, trace Peripapillary atrophy inferiorly, Pink and Sharp compact, Pink and Sharp   C/D Ratio 0.1 0.1   Macula Flat, Blunted foveal reflex, RPE mottling and clumping; drusen; no heme or edema flat, Blunted foveal reflex, RPE mottling and clumping, ERM - stable; drusen; no heme or edema   Vessels mild attenuated, mild Tortuousity attenuated, Tortuous   Periphery Attached, paving stone at 0700, mild Reticular degeneration attached        Refraction    Wearing Rx      Sphere Cylinder Axis Add   Right [place]   +2.75   Left -1.00 +0.50 178 +2.75          IMAGING AND PROCEDURES  Imaging and Procedures for 08/29/17  OCT, Retina - OU - Both Eyes       Right Eye Quality was good. Central Foveal Thickness: 341. Progression has been stable. Findings include no IRF, no SRF, retinal drusen , normal foveal contour (Persistent drusen - stable from prior, blunted foveal countour).   Left Eye Quality was good. Central Foveal Thickness: 398. Progression has been stable. Findings include no IRF, no SRF, abnormal foveal contour, retinal drusen , epiretinal membrane.   Notes Images taken, stored on drive  Diagnosis / Impression:   nonexudative ARMD OU -- persistent drusen -- stable ERM OS -- mild -- stable from prior  Clinical management:  See below  Abbreviations: NFP - Normal foveal profile. CME - cystoid macular edema. PED - pigment epithelial detachment. IRF - intraretinal fluid. SRF - subretinal fluid. EZ - ellipsoid zone. ERM - epiretinal membrane. ORA - outer retinal atrophy. ORT - outer retinal tubulation. SRHM - subretinal hyper-reflective material                  ASSESSMENT/PLAN:    ICD-10-CM   1. Intermediate stage nonexudative age-related macular degeneration of both eyes  H35.3132   2. Retinal edema  H35.81 OCT,  Retina - OU - Both Eyes  3. Epiretinal membrane (ERM) of left eye  H35.372   4. Pseudophakia of both eyes  Z96.1   5. Dry eyes  H04.123     1,2. Age related macular degeneration, non-exudative, both eyes  - mild-intermediate stage OU -- stable drusen on OCT  - The incidence, anatomy, and pathology of dry AMD, risk of progression, and the AREDS and AREDS 2 study including smoking risks discussed with patient.  - continue amsler grid monitoring  - recommend f/u 6-9 months, sooner prn -- pt prefers q6 mo visits -- okay  3. Epiretinal membrane, left eye   - very mild ERM, BCVA 20/25+ with no metamorphopsia OS  - OCT and exam stable from prior -- reviewed all OCTs dating back to 2018 -- no significant change  - no surgical intervention indicated at this time  - monitor  - f/u in 6 mos  4. Pseudophakia OU  - s/p CE/IOL OU w/ Dr. Herbert Deaner  - beautiful surgeries, doing well  - monitor  5. Dry Eyes OU  - BCVA remains 20/20 OD, OS improved to 20/25  - exam shows persistent PEE (1-2+), ABMD, decreased TBUT and tear film debris  - recommend hot compresses OU BID  - recommend artificial tears and lubricating ointment as needed   Ophthalmic Meds Ordered this visit:  No orders of the defined types were placed in this encounter.      Return in about 6 months (around 05/19/2021) for f/u  exu ARMD OU, DFE, OCT.  There are no Patient Instructions on file for this visit.  This document serves as a record of services personally performed by Gardiner Sleeper, MD, PhD. It was created on their behalf by Estill Bakes, COT an ophthalmic technician. The creation of this record is the provider's dictation and/or activities during the visit.    Electronically signed by: Estill Bakes, COT 5.31.22 @ 1:05 PM   This document serves as a record of services personally performed by Gardiner Sleeper, MD, PhD. It was created on their behalf by San Jetty. Owens Shark, OA an ophthalmic technician. The creation of this  record is the provider's dictation and/or activities during the visit.    Electronically signed by: San Jetty. [place], [place] 05.31.2022 1:05 PM   Gardiner Sleeper, M.D., Ph.D. Diseases & Surgery of the Retina and Vitreous [facility]  I have reviewed the above documentation for accuracy and completeness, and I agree with the above. Gardiner Sleeper, M.D., Ph.D. 11/20/20 1:05 PM  Abbreviations: M myopia (nearsighted); A astigmatism; H hyperopia (farsighted); P presbyopia; Mrx spectacle prescription;  CTL contact lenses; OD right eye; OS left eye; OU both eyes  XT exotropia; ET esotropia; PEK punctate epithelial keratitis; PEE punctate epithelial erosions; DES dry eye syndrome; MGD meibomian gland dysfunction; ATs artificial tears; PFAT's preservative free artificial tears; [place] nuclear sclerotic cataract; PSC posterior subcapsular cataract; ERM epi-retinal membrane; PVD posterior vitreous detachment; RD retinal detachment; DM diabetes mellitus; DR diabetic retinopathy; NPDR non-proliferative diabetic retinopathy; PDR proliferative diabetic retinopathy; CSME clinically significant macular edema; DME diabetic macular edema; dbh dot blot hemorrhages; CWS cotton wool spot; POAG primary open angle glaucoma; C/D cup-to-disc ratio; HVF humphrey visual field; GVF goldmann visual field; OCT optical coherence tomography; IOP intraocular pressure; BRVO Branch retinal vein occlusion; CRVO central retinal vein occlusion; CRAO central retinal artery occlusion; BRAO branch retinal artery occlusion; RT retinal tear; SB scleral buckle; PPV pars plana vitrectomy; VH Vitreous hemorrhage; PRP panretinal laser photocoagulation; IVK intravitreal kenalog; VMT vitreomacular traction; MH Macular hole;  NVD neovascularization of the disc; NVE neovascularization elsewhere; AREDS age related eye disease study; ARMD age related macular degeneration; POAG primary open angle glaucoma; EBMD epithelial/anterior basement  membrane dystrophy; ACIOL anterior chamber intraocular lens; IOL intraocular lens; PCIOL posterior chamber intraocular lens; Phaco/IOL phacoemulsification with intraocular lens placement; [place] photorefractive keratectomy; LASIK laser assisted in situ keratomileusis; HTN hypertension; DM diabetes mellitus; COPD chronic obstructive pulmonary disease

## 2020-11-17 NOTE — Telephone Encounter (Signed)
Patient c/o Palpitations:  High priority if patient c/o lightheadedness, shortness of breath, or chest pain  1) How long have you had palpitations/irregular HR/ Afib? Are you having the symptoms now? Patient states everyday before she wakes up her HR is elevated. States she is unsure whether or not this is considered palpitations, but she is asymptomatic now. She only has symptoms after waking up in the morning or after waking up from a nap.  2) Are you currently experiencing lightheadedness, SOB or CP?  No   3) Do you have a history of afib (atrial fibrillation) or irregular heart rhythm?  No   4) Have you checked your BP or HR? (document readings if available):  She states she checks her HR/BP daily and they are relatively normal, but she doesn't have any readings  5) Are you experiencing any other symptoms?  Pain on left side of neck

## 2020-11-17 NOTE — Telephone Encounter (Signed)
I will talk to her about this at the time of her appt.

## 2020-11-18 NOTE — Telephone Encounter (Signed)
Spoke with pt, aware of dr hochrein's recommendations. ?

## 2020-11-20 ENCOUNTER — Encounter (INDEPENDENT_AMBULATORY_CARE_PROVIDER_SITE_OTHER): Payer: Self-pay | Admitting: Ophthalmology

## 2020-12-14 ENCOUNTER — Ambulatory Visit: Payer: Medicare HMO | Admitting: Cardiology

## 2020-12-15 NOTE — Progress Notes (Signed)
Cardiology Office Note   Date:  12/17/2020   ID:  Michelle, Keith [birth year], MRN [id number]  PCP:  [name], MD Cardiologist:   Minus Breeding, MD   Chief Complaint  Patient presents with   Fatigue      History of Present Illness: Michelle Keith is a 87 y.o. female who presents for follow up of chest pain. She did have a history of syncope. She's had a workup that has included a negative stress perfusion study in 2016. Of note there is mention of a possible VSD in her primary care notes. However, I reviewed the echo results in 2007 and don't see mention of this. She has  had palpitations and has been intolerant of calcium channel blockers and beta blockers.     She wore a Holter in  2017.  There were no significant arrhythmias.  Since the last visit her she called because she was having palpitations in the morning.    She was having the same symptoms she has had before when we try to capture on the monitor.  She had the spells in the afternoon after taking a nap.  They were symptoms of feeling "sick."  She would feel weak the next day.  Her heart might skip a beat but this was not a predominant symptom.  She just would not feel hungry.  She did feel weak.  She has checked her blood pressure during these episodes and has been normal.  She does not know what her heart rate has been.  She had not been having them for about 6 months.  Then she had about 3 episodes.  They last for about 2 and half hours.  But she has not had any in a few weeks.  She walks 4 times a week.  She does not describe any symptoms with this.  She has no chest pressure, neck or arm discomfort.  She has no shortness of breath, PND or orthopnea.  She had no weight gain or edema.   Past Medical History:  Diagnosis Date   Dysautonomia ([place])    Esophageal stricture    GERD (gastroesophageal reflux disease)    Hiatal hernia    IBS (irritable bowel syndrome)    Macular degeneration    Dry OU   Osteopenia     Osteoporosis    Right thyroid nodule    Squamous cell skin cancer    Syncope     Past Surgical History:  Procedure Laterality Date   C-EYE SURGERY PROCEDURE     30 yrs ago   Cataract extraction     CATARACT EXTRACTION Bilateral 2001   Dr. Herbert Deaner   COLONOSCOPY  2009   Also 2002   ESOPHAGEAL DILATION     x 4   ESOPHAGEAL MANOMETRY     ESOPHAGOGASTRODUODENOSCOPY  Multiple   Exploratory laparoscopy     EYE SURGERY Bilateral    Cat Sx - Dr. Dorene Ar FUNDOPLICATION     TOTAL ABDOMINAL HYSTERECTOMY       Current Outpatient Medications  Medication Sig Dispense Refill   Calcium-Vitamin D-Vitamin K (VIACTIV PO) Take 1 tablet by mouth 2 (two) times daily.     cholecalciferol (VITAMIN D) 1000 UNITS tablet Take 2,000 Units by mouth daily.      cholecalciferol (VITAMIN D) 1000 units tablet Vitamin D     conjugated estrogens (PREMARIN) vaginal cream Premarin 0.625 mg/gram vaginal cream  Insert 0.5 g every 3 weeks by vaginal  route.     denosumab (PROLIA) 60 MG/ML SOSY injection Inject 60 mg into the skin once. 2 times a year     metoprolol tartrate (LOPRESSOR) 25 MG tablet Pt takes 1 1/2 tablet by mouth daily     Multiple Vitamins-Minerals (PRESERVISION AREDS 2 PO) Take by mouth.     pantoprazole (PROTONIX) 40 MG tablet Take 40 mg by mouth daily as needed (heartburn). Patient states she takes it every other day prn     valACYclovir (VALTREX) 500 MG tablet Take 500 mg by mouth as needed.     zolpidem (AMBIEN) 10 MG tablet Take 2.5 mg by mouth daily as needed for sleep.      No current facility-administered medications for this visit.    Allergies:   Epinephrine, Lidocaine, Aspirin, Atenolol, Clarithromycin, Cortisone, Omeprazole magnesium, Penicillins, Pindolol, Pseudoephedrine-guaifenesin, Sulfonamide derivatives, Tetracycline, Glycol stearate, and Valsartan    ROS:  Please see the history of present illness.   Otherwise, review of systems are positive for none.  All other  systems are reviewed and negative.    PHYSICAL EXAM: VS:  BP (!) 152/66   Pulse 71   Ht 5\' 5"  (1.651 m)   Wt 127 lb 6.4 oz (57.8 kg)   SpO2 97%   BMI 21.20 kg/m  , BMI Body mass index is 21.2 kg/m.  GENERAL:  Well appearing NECK:  No jugular venous distention, waveform within normal limits, carotid upstroke brisk and symmetric, no bruits, no thyromegaly LUNGS:  Clear to auscultation bilaterally CHEST:  Unremarkable HEART:  PMI not displaced or sustained,S1 and S2 within normal limits, no S3, no S4, no clicks, no rubs, no murmurs ABD:  Flat, positive bowel sounds normal in frequency in pitch, no bruits, no rebound, no guarding, no midline pulsatile mass, no hepatomegaly, no splenomegaly EXT:  2 plus pulses throughout, mild left ankle edema, no cyanosis no clubbing   EKG:  EKG is  ordered today. Sinus rhythm, rate 63, axis within normal limits, intervals within normal limits, no acute ST-T wave changes.  Recent Labs: No results found for requested labs within last 8760 hours.    Lipid Panel No results found for: CHOL, TRIG, HDL, CHOLHDL, VLDL, LDLCALC, LDLDIRECT    Wt Readings from Last 3 Encounters:  12/17/20 127 lb 6.4 oz (57.8 kg)  04/17/20 129 lb (58.5 kg)  07/19/18 124 lb 9.6 oz (56.5 kg)    Lab Results  Component Value Date   TSH 3.05 01/13/2016     Other studies Reviewed: Additional studies/ records that were reviewed today include: None Review of the above records demonstrates:    NA   ASSESSMENT AND PLAN:   PALPITATIONS:     It is very hard to know what her spells are.  We have never been able to capture them and they currently have gone away.  I do not think they are arrhythmic.  She may be having some GI issues since she has had a long history of this.  However, she is going to start monitoring herself with a Jodelle Red and let me know if she has any of the symptoms that we can capture going forward.   HTN:   Her blood pressure is very mildly elevated but she  does not tolerate meds.  No change in therapy is indicated.  Her blood pressure systolics are typically in the 140s.    Current medicines are reviewed at length with the patient today.  The patient does not have concerns regarding medicines.  The following changes have been made:   None  Labs/ tests ordered today include:    None  Orders Placed This Encounter  Procedures   EKG 12-Lead      Disposition:   FU with me in 12 months.     Signed, Minus Breeding, MD  12/17/2020 11:17 AM    [place]

## 2020-12-17 ENCOUNTER — Ambulatory Visit: Payer: Medicare HMO | Admitting: Cardiology

## 2020-12-17 ENCOUNTER — Other Ambulatory Visit: Payer: Self-pay

## 2020-12-17 ENCOUNTER — Encounter: Payer: Self-pay | Admitting: Cardiology

## 2020-12-17 VITALS — BP 152/66 | HR 71 | Ht 65.0 in | Wt 127.4 lb

## 2020-12-17 DIAGNOSIS — R002 Palpitations: Secondary | ICD-10-CM | POA: Diagnosis not present

## 2020-12-17 DIAGNOSIS — I10 Essential (primary) hypertension: Secondary | ICD-10-CM

## 2020-12-17 NOTE — Patient Instructions (Signed)

## 2021-02-03 DIAGNOSIS — W01190A Fall on same level from slipping, tripping and stumbling with subsequent striking against furniture, initial encounter: Secondary | ICD-10-CM | POA: Diagnosis not present

## 2021-02-03 DIAGNOSIS — S0083XA Contusion of other part of head, initial encounter: Secondary | ICD-10-CM | POA: Diagnosis not present

## 2021-02-03 DIAGNOSIS — S01411A Laceration without foreign body of right cheek and temporomandibular area, initial encounter: Secondary | ICD-10-CM | POA: Diagnosis not present

## 2021-02-14 ENCOUNTER — Emergency Department (HOSPITAL_COMMUNITY): Payer: Medicare HMO

## 2021-02-14 ENCOUNTER — Other Ambulatory Visit: Payer: Self-pay

## 2021-02-14 ENCOUNTER — Encounter (HOSPITAL_COMMUNITY): Payer: Self-pay | Admitting: Emergency Medicine

## 2021-02-14 ENCOUNTER — Emergency Department (HOSPITAL_COMMUNITY)
Admission: EM | Admit: 2021-02-14 | Discharge: 2021-02-15 | Disposition: A | Discharge status: Home / Self Care | Payer: Medicare HMO | Attending: Emergency Medicine | Admitting: Emergency Medicine

## 2021-02-14 DIAGNOSIS — Z20822 Contact with and (suspected) exposure to covid-19: Secondary | ICD-10-CM | POA: Insufficient documentation

## 2021-02-14 DIAGNOSIS — R197 Diarrhea, unspecified: Secondary | ICD-10-CM | POA: Diagnosis not present

## 2021-02-14 DIAGNOSIS — I10 Essential (primary) hypertension: Secondary | ICD-10-CM | POA: Insufficient documentation

## 2021-02-14 DIAGNOSIS — Z743 Need for continuous supervision: Secondary | ICD-10-CM | POA: Diagnosis not present

## 2021-02-14 DIAGNOSIS — G4489 Other headache syndrome: Secondary | ICD-10-CM | POA: Diagnosis not present

## 2021-02-14 DIAGNOSIS — R55 Syncope and collapse: Secondary | ICD-10-CM | POA: Diagnosis not present

## 2021-02-14 DIAGNOSIS — R531 Weakness: Secondary | ICD-10-CM | POA: Insufficient documentation

## 2021-02-14 DIAGNOSIS — I951 Orthostatic hypotension: Secondary | ICD-10-CM | POA: Diagnosis not present

## 2021-02-14 DIAGNOSIS — R42 Dizziness and giddiness: Secondary | ICD-10-CM | POA: Diagnosis not present

## 2021-02-14 LAB — CBC WITH DIFFERENTIAL/PLATELET
Abs Immature Granulocytes: 0.01 10*3/uL (ref 0.00–0.07)
Basophils Absolute: 0 10*3/uL (ref 0.0–0.1)
Basophils Relative: 0 %
Eosinophils Absolute: 0.1 10*3/uL (ref 0.0–0.5)
Eosinophils Relative: 1 %
HCT: 43 % (ref 36.0–46.0)
Hemoglobin: 14 g/dL (ref 12.0–15.0)
Immature Granulocytes: 0 %
Lymphocytes Relative: 17 %
Lymphs Abs: 1 10*3/uL (ref 0.7–4.0)
MCH: 30.4 pg (ref 26.0–34.0)
MCHC: 32.6 g/dL (ref 30.0–36.0)
MCV: 93.5 fL (ref 80.0–100.0)
Monocytes Absolute: 0.7 10*3/uL (ref 0.1–1.0)
Monocytes Relative: 12 %
Neutro Abs: 4 10*3/uL (ref 1.7–7.7)
Neutrophils Relative %: 70 %
Platelets: 364 10*3/uL (ref 150–400)
RBC: 4.6 MIL/uL (ref 3.87–5.11)
RDW: 13.2 % (ref 11.5–15.5)
WBC: 5.7 10*3/uL (ref 4.0–10.5)
nRBC: 0 % (ref 0.0–0.2)

## 2021-02-14 LAB — URINALYSIS, ROUTINE W REFLEX MICROSCOPIC
Bacteria, UA: NONE SEEN
Bilirubin Urine: NEGATIVE
Glucose, UA: NEGATIVE mg/dL
Hgb urine dipstick: NEGATIVE
Ketones, ur: 5 mg/dL — AB
Nitrite: NEGATIVE
Protein, ur: NEGATIVE mg/dL
Specific Gravity, Urine: 1.017 (ref 1.005–1.030)
pH: 5 (ref 5.0–8.0)

## 2021-02-14 LAB — COMPREHENSIVE METABOLIC PANEL
ALT: 14 U/L (ref 0–44)
AST: 16 U/L (ref 15–41)
Albumin: 3.8 g/dL (ref 3.5–5.0)
Alkaline Phosphatase: 52 U/L (ref 38–126)
Anion gap: 9 (ref 5–15)
BUN: 18 mg/dL (ref 8–23)
CO2: 24 mmol/L (ref 22–32)
Calcium: 9 mg/dL (ref 8.9–10.3)
Chloride: 104 mmol/L (ref 98–111)
Creatinine, Ser: 0.76 mg/dL (ref 0.44–1.00)
GFR, Estimated: >60 mL/min (ref >60)
Glucose, Bld: 145 mg/dL — ABNORMAL HIGH (ref 70–99)
Potassium: 4.4 mmol/L (ref 3.5–5.1)
Sodium: 137 mmol/L (ref 135–145)
Total Bilirubin: 0.8 mg/dL (ref 0.3–1.2)
Total Protein: 6.5 g/dL (ref 6.5–8.1)

## 2021-02-14 LAB — TROPONIN I (HIGH SENSITIVITY)
Troponin I (High Sensitivity): 3 ng/L (ref <18)
Troponin I (High Sensitivity): 4 ng/L (ref <18)

## 2021-02-14 LAB — LACTIC ACID, PLASMA: Lactic Acid, Venous: 1.6 mmol/L (ref 0.5–1.9)

## 2021-02-14 MED ORDER — SODIUM CHLORIDE 0.9 % IV BOLUS
500.0000 mL | Freq: Once | INTRAVENOUS | Status: AC
  Administered 2021-02-15: 500 mL via INTRAVENOUS

## 2021-02-14 NOTE — ED Provider Notes (Signed)
Emergency Medicine Provider Triage Evaluation Note  Michelle Keith , a 87 y.o. female  was evaluated in triage.  Pt complains of needing to be checked for sepsis.  She states that a week ago she had a fall at home and hit the left side of her face.  She is concerned she has sepsis from the cut.  No fevers.  She reports feeling presyncopal since yesterday.  She was orthostatic with EMS.  Patient states that since yesterday she has felt "dealthy ill" but has difficulty giving additional information.  She feels like she did three years ago when she "almost had sepsis."  She did not have a CT scan or her head or neck after the fall.   Review of Systems  Positive: Feeling poorly, diarrhea, light headed Negative: vomiting  Physical Exam  BP (!) 144/68 (BP Location: Right Arm)   Pulse 95   Temp 97.9 F (36.6 C) (Oral)   Resp 19   Ht '5\' 5"'$  (1.651 m)   Wt 57 kg   SpO2 100%   BMI 20.91 kg/m  Gen:   Awake, no distress   Resp:  Normal effort  MSK:   Moves extremities without difficulty  Other:  Speech is not slurred.  Steri-stropped area on the face/left cheek.   Medical Decision Making  Medically screening exam initiated at 5:16 PM.  Appropriate orders placed.  Michelle Keith was informed that the remainder of the evaluation will be completed by another provider, this initial triage assessment does not replace that evaluation, and the importance of remaining in the ED until their evaluation is complete.  As patient is 85, had a fall and is now feeling poorly with out any imaging done I recommended CT head at minimum to evaluate for intracranial injury or bleed.  I discussed with her that this can be life threatening if not appropriately treated.  She refused this stating she wanted me to "check  for sepsis first." I explained to her that I would order that also and she still declined CT head.     Lorin Glass, PA-C 02/14/21 1724    Jeanell Sparrow, DO 02/15/21 0116

## 2021-02-14 NOTE — ED Triage Notes (Signed)
Arrives via EMS from home, started feeling weak and dizzy today, had near syncope at home. Was orthostatic w/ EMS. Denies chest pain, N/V. Had some diarrhea last night.   CBG 137 w/ EMS P 95 O2 100% RA

## 2021-02-15 ENCOUNTER — Encounter (HOSPITAL_COMMUNITY): Payer: Self-pay | Admitting: Emergency Medicine

## 2021-02-15 DIAGNOSIS — H6120 Impacted cerumen, unspecified ear: Secondary | ICD-10-CM | POA: Diagnosis not present

## 2021-02-15 DIAGNOSIS — D72829 Elevated white blood cell count, unspecified: Secondary | ICD-10-CM | POA: Diagnosis not present

## 2021-02-15 DIAGNOSIS — K529 Noninfective gastroenteritis and colitis, unspecified: Secondary | ICD-10-CM | POA: Diagnosis not present

## 2021-02-15 DIAGNOSIS — R221 Localized swelling, mass and lump, neck: Secondary | ICD-10-CM | POA: Diagnosis not present

## 2021-02-15 DIAGNOSIS — Z1331 Encounter for screening for depression: Secondary | ICD-10-CM | POA: Diagnosis not present

## 2021-02-15 DIAGNOSIS — I10 Essential (primary) hypertension: Secondary | ICD-10-CM | POA: Diagnosis not present

## 2021-02-15 DIAGNOSIS — Q210 Ventricular septal defect: Secondary | ICD-10-CM | POA: Diagnosis not present

## 2021-02-15 DIAGNOSIS — I7090 Unspecified atherosclerosis: Secondary | ICD-10-CM | POA: Diagnosis not present

## 2021-02-15 DIAGNOSIS — Z0000 Encounter for general adult medical examination without abnormal findings: Secondary | ICD-10-CM | POA: Diagnosis not present

## 2021-02-15 DIAGNOSIS — I951 Orthostatic hypotension: Secondary | ICD-10-CM | POA: Diagnosis not present

## 2021-02-15 DIAGNOSIS — K449 Diaphragmatic hernia without obstruction or gangrene: Secondary | ICD-10-CM | POA: Diagnosis not present

## 2021-02-15 LAB — RESP PANEL BY RT-PCR (FLU A&B, COVID) ARPGX2
Influenza A by PCR: NEGATIVE
Influenza B by PCR: NEGATIVE
SARS Coronavirus 2 by RT PCR: NEGATIVE

## 2021-02-15 NOTE — ED Notes (Signed)
Patient ambulated to the bedside commode. Patient Stated she did not feel "sick" while moving.

## 2021-02-15 NOTE — ED Provider Notes (Signed)
[facility] DEPT Provider Note   CSN: [id number] Arrival date & time: 02/14/21  1644     History No chief complaint on file.   Michelle Keith is a 87 y.o. female.  The history is provided by the patient and a relative.  Illness Location:  At home Quality:  Global weakness following 1 episode of diarrhea today. Severity:  Mild Onset quality:  Sudden Duration:  1 day Timing:  Constant Progression:  Unchanged Chronicity:  New Context:  Had a fall almost 2 weeks ago and had an abrasion on her cheek and was concerned about sepsis.  No fevers, no chills, no redness nor drainage. Relieved by:  Nothing Worsened by:  Nothing Ineffective treatments:  None Associated symptoms: no abdominal pain, no chest pain, no congestion, no cough, no ear pain, no fever, no headaches, no loss of consciousness, no myalgias, no nausea, no rash, no rhinorrhea, no shortness of breath, no sore throat, no vomiting and no wheezing   Risk factors:  Elderly Patient with dysautonomia was concerned she might be septic following one episode of diarrhea and a scratch to her face 2 weeks ago.  Was orthostatic for EMS.  No chest pain, no SOB.  No nausea vomiting or diaphoresis.  No fevers, no chills.  No HA, no abdominal pain.  No urinary symptoms.      Past Medical History:  Diagnosis Date   Dysautonomia ([place])    Esophageal stricture    GERD (gastroesophageal reflux disease)    Hiatal hernia    IBS (irritable bowel syndrome)    Macular degeneration    Dry OU   Osteopenia    Osteoporosis    Right thyroid nodule    Squamous cell skin cancer    Syncope     Patient Active Problem List   Diagnosis Date Noted   Ileus ([place]) 11/25/2016   Essential hypertension 06/23/2016   DYSAUTONOMIA 07/10/2009   SYNCOPE 07/10/2009   Heart palpitations 07/10/2009   Chest pain 07/10/2009    Past Surgical History:  Procedure Laterality Date   C-EYE SURGERY PROCEDURE     30 yrs ago    Cataract extraction     CATARACT EXTRACTION Bilateral 2001   Dr. Herbert Deaner   COLONOSCOPY  2009   Also 2002   ESOPHAGEAL DILATION     x 4   ESOPHAGEAL MANOMETRY     ESOPHAGOGASTRODUODENOSCOPY  Multiple   Exploratory laparoscopy     EYE SURGERY Bilateral    Cat Sx - Dr. Dorene Ar FUNDOPLICATION     TOTAL ABDOMINAL HYSTERECTOMY       OB History   No obstetric history on file.     Family History  Problem Relation Age of Onset   Multiple myeloma Father        died age 31   Heart failure Mother        died age 45    Social History   Tobacco Use   Smoking status: Never   Smokeless tobacco: Never  Vaping Use   Vaping Use: Never used  Substance Use Topics   Alcohol use: No    Alcohol/week: 0.0 standard drinks   Drug use: No    Home Medications Prior to Admission medications   Medication Sig Start Date End Date Taking? Authorizing Provider  Calcium-Vitamin D-Vitamin K (VIACTIV PO) Take 1 tablet by mouth 2 (two) times daily.    [provider]  cholecalciferol (VITAMIN D) 1000 UNITS tablet Take 2,000  Units by mouth daily.     [provider]  cholecalciferol (VITAMIN D) 1000 units tablet Vitamin D    [provider]  conjugated estrogens (PREMARIN) vaginal cream Premarin 0.625 mg/gram vaginal cream  Insert 0.5 g every 3 weeks by vaginal route.    [provider]  denosumab (PROLIA) 60 MG/ML SOSY injection Inject 60 mg into the skin once. 2 times a year    [provider]  metoprolol tartrate (LOPRESSOR) 25 MG tablet Pt takes 1 1/2 tablet by mouth daily    [provider]  Multiple Vitamins-Minerals (PRESERVISION AREDS 2 PO) Take by mouth.    [provider]  pantoprazole (PROTONIX) 40 MG tablet Take 40 mg by mouth daily as needed (heartburn). Patient states she takes it every other day prn    [provider]  valACYclovir (VALTREX) 500 MG tablet Take 500 mg by mouth as needed.    [provider]  zolpidem (AMBIEN) 10 MG tablet Take 2.5 mg by mouth daily as needed for sleep.     [provider]    Allergies    Epinephrine, Lidocaine, Aspirin, Atenolol, Clarithromycin, Cortisone, Omeprazole magnesium, Penicillins, Pindolol, Pseudoephedrine-guaifenesin, Sulfonamide derivatives, Tetracycline, Glycol stearate, and Valsartan  Review of Systems   Review of Systems  Constitutional:  Negative for chills and fever.  HENT:  Negative for congestion, ear pain, rhinorrhea and sore throat.   Eyes:  Negative for redness.  Respiratory:  Negative for cough, shortness of breath and wheezing.   Cardiovascular:  Negative for chest pain.  Gastrointestinal:  Negative for abdominal pain, nausea and vomiting.  Genitourinary:  Negative for difficulty urinating and dysuria.  Musculoskeletal:  Negative for myalgias.  Skin:  Negative for rash.  Neurological:  Negative for loss of consciousness, facial asymmetry, speech difficulty, weakness and headaches.  Psychiatric/Behavioral:  Negative for agitation.   All other systems reviewed and are negative.  Physical Exam Updated Vital Signs BP 122/63   Pulse 97   Temp 97.9 F (36.6 C) (Oral)   Resp 16   Ht '5\' 5"'  (1.651 m)   Wt 57 kg   SpO2 98%   BMI 20.91 kg/m   Physical Exam Vitals and nursing note reviewed.  Constitutional:      General: She is not in acute distress.    Appearance: Normal appearance.  HENT:     Head: Normocephalic.      Nose: Nose normal.     Mouth/Throat:     Mouth: Mucous membranes are moist.     Pharynx: Oropharynx is clear.  Eyes:     Conjunctiva/sclera: Conjunctivae normal.     Pupils: Pupils are equal, round, and reactive to light.  Cardiovascular:     Rate and Rhythm: Normal rate and regular rhythm.     Pulses: Normal pulses.     Heart sounds: Normal heart sounds.  Pulmonary:     Effort: Pulmonary effort is normal.     Breath sounds: Normal breath sounds.  Abdominal:     General:  Abdomen is flat. Bowel sounds are normal.     Palpations: Abdomen is soft.     Tenderness: There is no abdominal tenderness. There is no guarding.  Musculoskeletal:        General: Normal range of motion.     Cervical back: Normal range of motion and neck supple. No rigidity.  Lymphadenopathy:     Cervical: No cervical adenopathy.  Skin:    General: Skin is warm and dry.  Capillary Refill: Capillary refill takes less than 2 seconds.  Neurological:     General: No focal deficit present.     Mental Status: She is alert and oriented to person, place, and time.     Deep Tendon Reflexes: Reflexes normal.  Psychiatric:        Mood and Affect: Mood normal.        Behavior: Behavior normal.    ED Results / Procedures / Treatments   Labs (all labs ordered are listed, but only abnormal results are displayed) Results for orders placed or performed during the hospital encounter of 02/14/21  Resp Panel by RT-PCR (Flu A&B, Covid) Nasopharyngeal Swab   Specimen: Nasopharyngeal Swab; Nasopharyngeal(NP) swabs in vial transport medium  Result Value Ref Range   SARS Coronavirus 2 by RT PCR NEGATIVE NEGATIVE   Influenza A by PCR NEGATIVE NEGATIVE   Influenza B by PCR NEGATIVE NEGATIVE  Comprehensive metabolic panel  Result Value Ref Range   Sodium 137 135 - 145 mmol/L   Potassium 4.4 3.5 - 5.1 mmol/L   Chloride 104 98 - 111 mmol/L   CO2 24 22 - 32 mmol/L   Glucose, Bld 145 (H) 70 - 99 mg/dL   BUN 18 8 - 23 mg/dL   Creatinine, Ser 0.76 0.44 - 1.00 mg/dL   Calcium 9.0 8.9 - 10.3 mg/dL   Total Protein 6.5 6.5 - 8.1 g/dL   Albumin 3.8 3.5 - 5.0 g/dL   AST 16 15 - 41 U/L   ALT 14 0 - 44 U/L   Alkaline Phosphatase 52 38 - 126 U/L   Total Bilirubin 0.8 0.3 - 1.2 mg/dL   GFR, Estimated >60 >60 mL/min   Anion gap 9 5 - 15  CBC with Differential  Result Value Ref Range   WBC 5.7 4.0 - 10.5 K/uL   RBC 4.60 3.87 - 5.11 MIL/uL   Hemoglobin 14.0 12.0 - 15.0 g/dL   HCT 43.0 36.0 - 46.0 %   MCV  93.5 80.0 - 100.0 fL   MCH 30.4 26.0 - 34.0 pg   MCHC 32.6 30.0 - 36.0 g/dL   RDW 13.2 11.5 - 15.5 %   Platelets 364 150 - 400 K/uL   nRBC 0.0 0.0 - 0.2 %   Neutrophils Relative % 70 %   Neutro Abs 4.0 1.7 - 7.7 K/uL   Lymphocytes Relative 17 %   Lymphs Abs 1.0 0.7 - 4.0 K/uL   Monocytes Relative 12 %   Monocytes Absolute 0.7 0.1 - 1.0 K/uL   Eosinophils Relative 1 %   Eosinophils Absolute 0.1 0.0 - 0.5 K/uL   Basophils Relative 0 %   Basophils Absolute 0.0 0.0 - 0.1 K/uL   Immature Granulocytes 0 %   Abs Immature Granulocytes 0.01 0.00 - 0.07 K/uL  Urinalysis, Routine w reflex microscopic  Result Value Ref Range   Color, Urine YELLOW YELLOW   APPearance HAZY (A) CLEAR   Specific Gravity, Urine 1.017 1.005 - 1.030   pH 5.0 5.0 - 8.0   Glucose, UA NEGATIVE NEGATIVE mg/dL   Hgb urine dipstick NEGATIVE NEGATIVE   Bilirubin Urine NEGATIVE NEGATIVE   Ketones, ur 5 (A) NEGATIVE mg/dL   Protein, ur NEGATIVE NEGATIVE mg/dL   Nitrite NEGATIVE NEGATIVE   Leukocytes,Ua MODERATE (A) NEGATIVE   RBC / HPF 0-5 0 - 5 RBC/hpf   WBC, UA 0-5 0 - 5 WBC/hpf   Bacteria, UA NONE SEEN NONE SEEN   Squamous Epithelial / LPF 0-5 0 -  5   Mucus PRESENT    Hyaline Casts, UA PRESENT    Non Squamous Epithelial 0-5 (A) NONE SEEN  Lactic acid, plasma  Result Value Ref Range   Lactic Acid, Venous 1.6 0.5 - 1.9 mmol/L  Troponin I (High Sensitivity)  Result Value Ref Range   Troponin I (High Sensitivity) 3 <18 ng/L  Troponin I (High Sensitivity)  Result Value Ref Range   Troponin I (High Sensitivity) 4 <18 ng/L   DG Chest 1 View  Result Date: 02/14/2021 CLINICAL DATA:  Weakness, dizziness, near-syncope EXAM: CHEST  1 VIEW COMPARISON:  11/25/2016 FINDINGS: Lungs are clear.  No pleural effusion or pneumothorax. Heart is normal in size. IMPRESSION: No evidence of acute cardiopulmonary disease. Electronically Signed   By: Julian Hy M.D.   On: 02/14/2021 23:45    EKG  EKG  Interpretation  Date/Time:  Sunday February 14 2021 20:44:32 EDT Ventricular Rate:  106 PR Interval:  143 QRS Duration: 86 QT Interval:  341 QTC Calculation: 453 R Axis:   -3 Text Interpretation: Sinus tachycardia Confirmed by Dory Horn) on 02/15/2021 12:56:00 AM        Radiology DG Chest 1 View  Result Date: 02/14/2021 CLINICAL DATA:  Weakness, dizziness, near-syncope EXAM: CHEST  1 VIEW COMPARISON:  11/25/2016 FINDINGS: Lungs are clear.  No pleural effusion or pneumothorax. Heart is normal in size. IMPRESSION: No evidence of acute cardiopulmonary disease. Electronically Signed   By: Julian Hy M.D.   On: 02/14/2021 23:45    Procedures Procedures   Medications Ordered in ED Medications  sodium chloride 0.9 % bolus 500 mL (500 mLs Intravenous New Bag/Given 02/15/21 0008)    ED Course  I have reviewed the triage vital signs and the nursing notes.  Pertinent labs & imaging results that were available during my care of the patient were reviewed by me and considered in my medical decision making (see chart for details).  Clinical Course as of 02/15/21 0050  Sun Feb 14, 2021  [facility] staff reported that patient had visitors come into the lobby to give her the cell phone. They then apparently took her behind a screen and fed her before bringing her back out.  [EH]    Clinical Course User Index [EH] Lorin Glass, PA-C   MDM Rules/Calculators/A&P                           Ruled out for MI in the ED, no chest pain or SOB. No abdominal pain.  No signs of infection on exam or labs, the patient is not septic.  She has no white blood cells nor bacteria in her urine.  No skin infections.  I have hydrated the patient in the ED.  She is very well appearing and stable for discharge with close follow up.    Michelle Keith was evaluated in Emergency Department on 02/15/2021 for the symptoms described in the history of present illness. She was evaluated in the  context of the global COVID-19 pandemic, which necessitated consideration that the patient might be at risk for infection with the SARS-CoV-2 virus that causes COVID-19. Institutional protocols and algorithms that pertain to the evaluation of patients at risk for COVID-19 are in a state of rapid change based on information released by regulatory bodies including the CDC and federal and state organizations. These policies and algorithms were followed during the patient's care in the ED.  Final Clinical Impression(s) /  ED Diagnoses Final diagnoses:  None   Return for intractable cough, coughing up blood, fevers > 100.4 unrelieved by medication, shortness of breath, intractable vomiting, chest pain, shortness of breath, weakness, numbness, changes in speech, facial asymmetry, abdominal pain, passing out, Inability to tolerate liquids or food, cough, altered mental status or any concerns. No signs of systemic illness or infection. The patient is nontoxic-appearing on exam and vital signs are within normal limits. I have reviewed the triage vital signs and the nursing notes. Pertinent labs & imaging results that were available during my care of the patient were reviewed by me and considered in my medical decision making (see chart for details). After history, exam, and medical workup I feel the patient has been appropriately medically screened and is safe for discharge home. Pertinent diagnoses were discussed with the patient. Patient was given return precautions.   Rx / DC Orders ED Discharge Orders     None        Annetta Deiss, MD 02/15/21 9892

## 2021-02-20 ENCOUNTER — Other Ambulatory Visit: Payer: Self-pay

## 2021-02-20 ENCOUNTER — Encounter (HOSPITAL_BASED_OUTPATIENT_CLINIC_OR_DEPARTMENT_OTHER): Payer: Self-pay | Admitting: Emergency Medicine

## 2021-02-20 ENCOUNTER — Emergency Department (HOSPITAL_BASED_OUTPATIENT_CLINIC_OR_DEPARTMENT_OTHER)
Admission: EM | Admit: 2021-02-20 | Discharge: 2021-02-20 | Disposition: A | Discharge status: Home / Self Care | Payer: Medicare HMO | Attending: Emergency Medicine | Admitting: Emergency Medicine

## 2021-02-20 DIAGNOSIS — R3915 Urgency of urination: Secondary | ICD-10-CM | POA: Diagnosis not present

## 2021-02-20 DIAGNOSIS — R5381 Other malaise: Secondary | ICD-10-CM

## 2021-02-20 DIAGNOSIS — I10 Essential (primary) hypertension: Secondary | ICD-10-CM | POA: Diagnosis not present

## 2021-02-20 DIAGNOSIS — R350 Frequency of micturition: Secondary | ICD-10-CM | POA: Diagnosis not present

## 2021-02-20 DIAGNOSIS — Z85828 Personal history of other malignant neoplasm of skin: Secondary | ICD-10-CM | POA: Diagnosis not present

## 2021-02-20 DIAGNOSIS — R300 Dysuria: Secondary | ICD-10-CM | POA: Diagnosis not present

## 2021-02-20 DIAGNOSIS — Z79899 Other long term (current) drug therapy: Secondary | ICD-10-CM | POA: Insufficient documentation

## 2021-02-20 LAB — URINALYSIS, ROUTINE W REFLEX MICROSCOPIC
Bilirubin Urine: NEGATIVE
Glucose, UA: NEGATIVE mg/dL
Hgb urine dipstick: NEGATIVE
Ketones, ur: NEGATIVE mg/dL
Nitrite: NEGATIVE
Protein, ur: NEGATIVE mg/dL
Specific Gravity, Urine: <1.005 — ABNORMAL LOW (ref 1.005–1.030)
pH: 7 (ref 5.0–8.0)

## 2021-02-20 LAB — BASIC METABOLIC PANEL
Anion gap: 8 (ref 5–15)
BUN: 8 mg/dL (ref 8–23)
CO2: 29 mmol/L (ref 22–32)
Calcium: 9.6 mg/dL (ref 8.9–10.3)
Chloride: 102 mmol/L (ref 98–111)
Creatinine, Ser: 0.75 mg/dL (ref 0.44–1.00)
GFR, Estimated: >60 mL/min (ref >60)
Glucose, Bld: 116 mg/dL — ABNORMAL HIGH (ref 70–99)
Potassium: 4.2 mmol/L (ref 3.5–5.1)
Sodium: 139 mmol/L (ref 135–145)

## 2021-02-20 LAB — CBC
HCT: 36.7 % (ref 36.0–46.0)
Hemoglobin: 12.2 g/dL (ref 12.0–15.0)
MCH: 30.1 pg (ref 26.0–34.0)
MCHC: 33.2 g/dL (ref 30.0–36.0)
MCV: 90.6 fL (ref 80.0–100.0)
Platelets: 349 10*3/uL (ref 150–400)
RBC: 4.05 MIL/uL (ref 3.87–5.11)
RDW: 13.3 % (ref 11.5–15.5)
WBC: 7.7 10*3/uL (ref 4.0–10.5)
nRBC: 0 % (ref 0.0–0.2)

## 2021-02-20 LAB — LACTIC ACID, PLASMA: Lactic Acid, Venous: 0.9 mmol/L (ref 0.5–1.9)

## 2021-02-20 NOTE — ED Triage Notes (Signed)
Pt has had urinary frequency (voiding every 15 minutes) since last night. Chills since lunch today.

## 2021-02-20 NOTE — Discharge Instructions (Addendum)
1.  At this time lab work is normal.  A urine culture is being done and result should be available in 24 to 48 hours. 2.  Sometimes people get bladder spasms and irritation without having infection.  Is important you follow-up with your doctor.  You may need referral to a urologist if your symptoms are persisting with no identifiable cause 3.  Return to the emergency department if you have worsening new or changing symptoms.

## 2021-02-20 NOTE — ED Provider Notes (Signed)
[facility] EMERGENCY DEPT Provider Note   CSN: [id number] Arrival date & time: 02/20/21  1826     History Chief Complaint  Patient presents with   Urinary Frequency    Michelle Keith is a 87 y.o. female.  HPI Patient reports she has not felt well for most a week.  Last _0  yrs ago   Cataract extraction     CATARACT EXTRACTION Bilateral 2001   Dr. Herbert Deaner   COLONOSCOPY  2009   Also 2002   ESOPHAGEAL DILATION     x 4   ESOPHAGEAL MANOMETRY     ESOPHAGOGASTRODUODENOSCOPY  Multiple   Exploratory laparoscopy     EYE SURGERY Bilateral    Cat Sx - Dr. Dorene Ar FUNDOPLICATION     TOTAL ABDOMINAL HYSTERECTOMY       OB History   No obstetric history on file.      Family History  Problem Relation Age of Onset   Multiple myeloma Father        died age 67   Heart failure Mother        died age 40    Social History   Tobacco Use   Smoking status: Never   Smokeless tobacco: Never  Vaping Use   Vaping Use: Never used  Substance Use Topics   Alcohol use: No    Alcohol/week: 0.0 standard drinks   Drug use: No    Home Medications Prior to Admission medications   Medication Sig Start Date End Date Taking? Authorizing Provider  Calcium-Vitamin D-Vitamin K (VIACTIV PO) Take 1 tablet by mouth 2 (two) times daily.    [provider]  cholecalciferol (VITAMIN D) 1000 UNITS tablet Take 2,000 Units by mouth daily.     [provider]  cholecalciferol (VITAMIN D) 1000 units tablet Vitamin D    [provider]  conjugated estrogens (PREMARIN) vaginal cream Premarin 0.625 mg/gram vaginal cream  Insert 0.5 g every 3 weeks by vaginal route.    [provider]  denosumab (PROLIA) 60 MG/ML SOSY injection Inject 60 mg  into the skin once. 2 times a year    [provider]  metoprolol tartrate (LOPRESSOR) 25 MG tablet Pt takes 1 1/2 tablet by mouth daily    [provider]  Multiple Vitamins-Minerals (PRESERVISION AREDS 2 PO) Take by mouth.    [provider]  pantoprazole (PROTONIX) 40 MG tablet Take 40 mg by mouth daily as needed (heartburn). Patient states she takes it every other day prn    [provider]  valACYclovir (VALTREX) 500 MG tablet Take 500 mg by mouth as needed.    [provider]  zolpidem (AMBIEN) 10 MG tablet Take 2.5 mg by mouth daily as needed for sleep.     [provider]    Allergies    Epinephrine, Lidocaine, Aspirin, Atenolol, Clarithromycin, Cortisone, Omeprazole magnesium, Penicillins, Pindolol, Pseudoephedrine-guaifenesin, Sulfonamide derivatives, Tetracycline, Glycol stearate, and Valsartan  Review of Systems   Review of Systems 10  systems reviewed and negative except as per HPI. Physical Exam Updated Vital Signs BP (!) 153/70   Pulse 83   Temp 97.9 F (36.6 C)   Resp 18   SpO2 99%   Physical Exam Constitutional:      Appearance: Normal appearance.  HENT:     Head: Normocephalic and atraumatic.     Mouth/Throat:     Mouth: Mucous membranes are moist.     Pharynx: Oropharynx is clear.  Eyes:     Extraocular Movements: Extraocular movements intact.  Cardiovascular:     Rate and Rhythm: Normal rate and regular rhythm.  Pulmonary:     Effort: Pulmonary effort is normal.     Breath sounds: Normal breath sounds.  Abdominal:     General: There is no distension.     Palpations: Abdomen is soft.     Tenderness: There is no abdominal tenderness. There is no guarding.  Musculoskeletal:        General: No swelling or tenderness. Normal range of motion.     Cervical back: Neck supple.     Right lower leg: No edema.     Left lower leg: No edema.  Skin:    General: Skin is warm and dry.  Neurological:     General: No focal deficit present.     Mental Status: She is alert and oriented to person, place, and time.     Motor: No weakness.     Coordination: Coordination normal.    ED Results / Procedures / Treatments   Labs (all labs ordered are listed, but only abnormal results are displayed) Labs Reviewed  URINALYSIS, ROUTINE W REFLEX MICROSCOPIC - Abnormal; Notable for the following components:      Result Value   Color, Urine COLORLESS (*)    Specific Gravity, Urine <1.005 (*)    Leukocytes,Ua MODERATE (*)    Bacteria, UA RARE (*)    All other components within normal limits  BASIC METABOLIC PANEL - Abnormal; Notable for the following components:   Glucose, Bld 116 (*)    All other components within normal limits  URINE CULTURE  CULTURE, BLOOD (ROUTINE X 2)  CULTURE, BLOOD (ROUTINE X 2)  LACTIC ACID, PLASMA  CBC  LACTIC ACID, PLASMA    EKG None  Radiology No results  found.  Procedures Procedures   Medications Ordered in ED Medications - No data to display  ED Course  I have reviewed the triage vital signs and the nursing notes.  Pertinent labs & imaging results that were available during my care of the  patient were reviewed by me and considered in my medical decision making (see chart for details).    MDM Rules/Calculators/A&P                           Patient has had symptoms for about a week.  Clinically patient is well in appearance.  Diagnostic work-up within normal limits.  Vital signs are stable.  Urinalysis and blood culture are pending.  At this time stable for discharge with careful return precautions and follow-up plan in place Final Clinical Impression(s) / ED Diagnoses Final diagnoses:  Urinary urgency  Malaise    Rx / DC Orders ED Discharge Orders     None        Charlesetta Shanks, MD 02/20/21 2301

## 2021-02-22 LAB — URINE CULTURE

## 2021-02-26 DIAGNOSIS — Z8616 Personal history of COVID-19: Secondary | ICD-10-CM | POA: Diagnosis not present

## 2021-02-26 DIAGNOSIS — R350 Frequency of micturition: Secondary | ICD-10-CM | POA: Diagnosis not present

## 2021-02-26 DIAGNOSIS — R531 Weakness: Secondary | ICD-10-CM | POA: Diagnosis not present

## 2021-02-26 DIAGNOSIS — W19XXXA Unspecified fall, initial encounter: Secondary | ICD-10-CM | POA: Diagnosis not present

## 2021-02-26 DIAGNOSIS — R5383 Other fatigue: Secondary | ICD-10-CM | POA: Diagnosis not present

## 2021-02-26 LAB — CULTURE, BLOOD (ROUTINE X 2)
Culture: NO GROWTH
Special Requests: ADEQUATE

## 2021-03-01 DIAGNOSIS — H02423 Myogenic ptosis of bilateral eyelids: Secondary | ICD-10-CM | POA: Diagnosis not present

## 2021-03-01 DIAGNOSIS — H35372 Puckering of macula, left eye: Secondary | ICD-10-CM | POA: Diagnosis not present

## 2021-03-01 DIAGNOSIS — H353132 Nonexudative age-related macular degeneration, bilateral, intermediate dry stage: Secondary | ICD-10-CM | POA: Diagnosis not present

## 2021-03-01 DIAGNOSIS — H26492 Other secondary cataract, left eye: Secondary | ICD-10-CM | POA: Diagnosis not present

## 2021-03-03 ENCOUNTER — Encounter (HOSPITAL_COMMUNITY): Payer: Medicare HMO

## 2021-03-03 ENCOUNTER — Other Ambulatory Visit (HOSPITAL_COMMUNITY): Payer: Self-pay | Admitting: *Deleted

## 2021-03-04 ENCOUNTER — Ambulatory Visit (HOSPITAL_COMMUNITY)
Admission: RE | Admit: 2021-03-04 | Discharge: 2021-03-04 | Disposition: A | Discharge status: Home / Self Care | Payer: Medicare HMO | Source: Ambulatory Visit | Attending: Internal Medicine | Admitting: Internal Medicine

## 2021-03-04 ENCOUNTER — Encounter (HOSPITAL_COMMUNITY): Payer: Medicare HMO

## 2021-03-04 ENCOUNTER — Other Ambulatory Visit: Payer: Self-pay

## 2021-03-04 DIAGNOSIS — M810 Age-related osteoporosis without current pathological fracture: Secondary | ICD-10-CM | POA: Insufficient documentation

## 2021-03-04 MED ORDER — DENOSUMAB 60 MG/ML ~~LOC~~ SOSY: 60.0000 mg | PREFILLED_SYRINGE | Freq: Once | SUBCUTANEOUS | Status: AC

## 2021-03-04 MED ORDER — DENOSUMAB 60 MG/ML ~~LOC~~ SOSY
PREFILLED_SYRINGE | SUBCUTANEOUS | Status: AC
  Administered 2021-03-04: 60 mg via SUBCUTANEOUS
  Filled 2021-03-04: qty 1

## 2021-03-05 DIAGNOSIS — Z1231 Encounter for screening mammogram for malignant neoplasm of breast: Secondary | ICD-10-CM | POA: Diagnosis not present

## 2021-03-22 DIAGNOSIS — R5383 Other fatigue: Secondary | ICD-10-CM | POA: Diagnosis not present

## 2021-03-22 DIAGNOSIS — R531 Weakness: Secondary | ICD-10-CM | POA: Diagnosis not present

## 2021-03-22 DIAGNOSIS — Z23 Encounter for immunization: Secondary | ICD-10-CM | POA: Diagnosis not present

## 2021-03-22 DIAGNOSIS — R1010 Upper abdominal pain, unspecified: Secondary | ICD-10-CM | POA: Diagnosis not present

## 2021-03-24 ENCOUNTER — Other Ambulatory Visit: Payer: Self-pay | Admitting: Internal Medicine

## 2021-03-24 DIAGNOSIS — R1010 Upper abdominal pain, unspecified: Secondary | ICD-10-CM

## 2021-03-24 DIAGNOSIS — R5383 Other fatigue: Secondary | ICD-10-CM

## 2021-04-29 ENCOUNTER — Other Ambulatory Visit: Payer: Medicare HMO

## 2021-05-11 DIAGNOSIS — H353131 Nonexudative age-related macular degeneration, bilateral, early dry stage: Secondary | ICD-10-CM | POA: Diagnosis not present

## 2021-05-25 DIAGNOSIS — Z0100 Encounter for examination of eyes and vision without abnormal findings: Secondary | ICD-10-CM | POA: Diagnosis not present

## 2021-06-23 DIAGNOSIS — M810 Age-related osteoporosis without current pathological fracture: Secondary | ICD-10-CM | POA: Diagnosis not present

## 2021-06-23 DIAGNOSIS — E559 Vitamin D deficiency, unspecified: Secondary | ICD-10-CM | POA: Diagnosis not present

## 2021-07-08 DIAGNOSIS — K589 Irritable bowel syndrome without diarrhea: Secondary | ICD-10-CM | POA: Diagnosis not present

## 2021-07-08 DIAGNOSIS — K219 Gastro-esophageal reflux disease without esophagitis: Secondary | ICD-10-CM | POA: Diagnosis not present

## 2021-07-14 ENCOUNTER — Telehealth: Payer: Self-pay | Admitting: Cardiology

## 2021-07-14 NOTE — Telephone Encounter (Signed)
Patient c/o Palpitations:  High priority if patient c/o lightheadedness, shortness of breath, or chest pain  How long have you had palpitations/irregular HR/ Afib? Are you having the symptoms now? Couple of weeks, no  Are you currently experiencing lightheadedness, SOB or CP? no  Do you have a history of afib (atrial fibrillation) or irregular heart rhythm? yes  Have you checked your BP or HR? (document readings if available): last night her HR was 102 and this morning her BP was 143/95  Are you experiencing any other symptoms? no   Patient states she has been having heart fluttering for in the last couple of weeks. She says she has gotten some SOB, when going up the stairs but not every time. She says last night her HR was 102 and this morning her BP was 143/95. She says she won't be home today until after 4 pm, or tomorrow morning before 12 pm.

## 2021-07-14 NOTE — Telephone Encounter (Signed)
Called patient back in regards to her palpitations. She states for the last few weeks she has had issues with the feeling of her heart racing. She states anytime she checks her HR is seems fine, the highest was last night it got to 102. Patient states her BP has been finem but yesterday it was 143/95. She states she normally feels these episodes first thing in the morning, or after meals. She states they can last up to around 10 minutes or so, and will go away. She denies chest pain, or chest tightness, but does mention having SOB when going up the stairs at her apartment. Patient denies med changes, is still taking metoprolol 1.5 tablet every day. Per last note from 11/2020 with Dr.Hochrein he recommended to do [facility] device, patient did not get this because her symptoms got better. However I did recommend that she do research on this, and her son will help her as this would be good option for her during these episodes. Patient will do this. I also recommended that she stay hydrated, patient states she does drink water only in the mornings and nothing the rest of the day because it makes her go to the bathroom. Patient made an appointment on 03/08 with Dr.Hochrein- I advised that she keep this appointment, I would route message to MD and would call back with any other recommendations.

## 2021-07-18 DIAGNOSIS — F419 Anxiety disorder, unspecified: Secondary | ICD-10-CM | POA: Diagnosis not present

## 2021-07-18 DIAGNOSIS — M810 Age-related osteoporosis without current pathological fracture: Secondary | ICD-10-CM | POA: Diagnosis not present

## 2021-07-18 DIAGNOSIS — I10 Essential (primary) hypertension: Secondary | ICD-10-CM | POA: Diagnosis not present

## 2021-07-19 NOTE — Progress Notes (Signed)
[facility] Note  07/20/2021     CHIEF COMPLAINT Patient presents for Retina Follow Up   HISTORY OF PRESENT ILLNESS: Michelle Keith is a 88 y.o. female who presents to the clinic today for:  HPI     Retina Follow Up   Patient presents with  Wet AMD.  In both eyes.  Severity is moderate.  Duration of 6 months.  Since onset it is gradually worsening.  I, the attending physician,  performed the HPI with the patient and updated documentation appropriately.        Comments   6 month retina follow up for Wet Armd ou. Patient states she thinks vision is getting little worse      Last edited by Bernarda Caffey, MD on 07/24/2021 12:29 AM.    Pt states since she was here last, she contracted covid, but it was not a bad case, pt saw Dr. Laban Emperor in November to get new glasses, she states she doesn't feel like she is seeing as well   Referring physician: Calvert Cantor, MD [address] [place],  [place] [address]  HISTORICAL INFORMATION:   Selected notes from the MEDICAL RECORD NUMBER Referred by Dr. Bing Plume for evaluation of AMD OU   CURRENT MEDICATIONS: No current outpatient medications on file. (Ophthalmic Drugs)   No current facility-administered medications for this visit. (Ophthalmic Drugs)   Current Outpatient Medications (Other)  Medication Sig   Calcium-Vitamin D-Vitamin K (VIACTIV PO) Take 1 tablet by mouth 2 (two) times daily.   cholecalciferol (VITAMIN D) 1000 UNITS tablet Take 2,000 Units by mouth daily.    cholecalciferol (VITAMIN D) 1000 units tablet Vitamin D   conjugated estrogens (PREMARIN) vaginal cream Premarin 0.625 mg/gram vaginal cream  Insert 0.5 g every 3 weeks by vaginal route.   denosumab (PROLIA) 60 MG/ML SOSY injection Inject 60 mg into the skin once. 2 times a year   metoprolol tartrate (LOPRESSOR) 25 MG tablet Pt takes 1 1/2 tablet by mouth daily   Multiple Vitamins-Minerals (PRESERVISION AREDS 2 PO) Take by mouth.    pantoprazole (PROTONIX) 40 MG tablet Take 40 mg by mouth daily as needed (heartburn). Patient states she takes it every other day prn   valACYclovir (VALTREX) 500 MG tablet Take 500 mg by mouth as needed.   zolpidem (AMBIEN) 10 MG tablet Take 2.5 mg by mouth daily as needed for sleep.    No current facility-administered medications for this visit. (Other)   REVIEW OF SYSTEMS: ROS   Positive for: Gastrointestinal, Musculoskeletal, Cardiovascular, Eyes Negative for: Constitutional, Neurological, Skin, Genitourinary, HENT, Endocrine, Respiratory, Psychiatric, Allergic/Imm, Heme/Lymph Last edited by Elmore Guise, COT on 07/20/2021  1:05 PM.     ALLERGIES Allergies  Allergen Reactions   Epinephrine Anaphylaxis   Lidocaine Anaphylaxis   Aspirin    Atenolol    Clarithromycin    Cortisone Other (See Comments)   Omeprazole Magnesium Other (See Comments)    Ask at office visit   Penicillins    Pindolol Diarrhea    Fatigue, hip pain   Pseudoephedrine-Guaifenesin Other (See Comments)   Sulfonamide Derivatives    Tetracycline Hives   Glycol Stearate Palpitations   Valsartan Palpitations   PAST MEDICAL HISTORY Past Medical History:  Diagnosis Date   Dysautonomia ([place])    Esophageal stricture    GERD (gastroesophageal reflux disease)    Hiatal hernia    IBS (irritable bowel syndrome)    Macular degeneration  Dry OU   Osteopenia    Osteoporosis    Right thyroid nodule    Squamous cell skin cancer    Syncope    Past Surgical History:  Procedure Laterality Date   C-EYE SURGERY PROCEDURE     30 yrs ago   Cataract extraction     CATARACT EXTRACTION Bilateral 2001   Dr. Herbert Deaner   COLONOSCOPY  2009   Also 2002   ESOPHAGEAL DILATION     x 4   ESOPHAGEAL MANOMETRY     ESOPHAGOGASTRODUODENOSCOPY  Multiple   Exploratory laparoscopy     EYE SURGERY Bilateral    Cat Sx - Dr. Dorene Ar FUNDOPLICATION     TOTAL ABDOMINAL HYSTERECTOMY     FAMILY HISTORY Family History   Problem Relation Age of Onset   Multiple myeloma Father        died age 5   Heart failure Mother        died age 65   SOCIAL HISTORY Social History   Tobacco Use   Smoking status: Never   Smokeless tobacco: Never  Vaping Use   Vaping Use: Never used  Substance Use Topics   Alcohol use: No    Alcohol/week: 0.0 standard drinks   Drug use: No       OPHTHALMIC EXAM:  Base Eye Exam     Visual Acuity (Snellen - Linear)       Right Left   Dist cc 20/25 20/30-3   Dist ph cc 20/NI 20/NI    Correction: Glasses         Tonometry (Tonopen, 1:07 PM)       Right Left   Pressure 7 7         Pupils       Dark Light Shape React APD   Right 3 2 Round Minimal None   Left 3 2 Round Minimal None         Visual Fields (Counting fingers)       Left Right    Full Full         Extraocular Movement       Right Left    Full, Ortho Full, Ortho         Neuro/Psych     Oriented x3: Yes   Mood/Affect: Normal         Dilation     Both eyes: 1.0% Mydriacyl, 2.5% Phenylephrine @ 1:07 PM           Slit Lamp and Fundus Exam     External Exam       Right Left   External Normal Normal         Slit Lamp Exam       Right Left   Lids/Lashes Dermatochalasis - upper lid Dermatochalasis - upper lid   Conjunctiva/Sclera White and quiet White and quiet   Cornea arcus, 1+ inferior peripheral Punctate epithelial erosions, trace Debris in tear film Arcus; 2+Punctate epithelial erosions, +Debris in tear film, EBMD, corneal haze   Anterior Chamber Deep and quiet Deep and quiet   Iris Round, dilated Round, dilated   Lens PCIOL; open PC PCIOL; 1+ PCO non central   Anterior Vitreous Vitreous syneresis, PVD, vitreous condensations Vitreous syneresis, PVD, vitreous condensations         Fundus Exam       Right Left   Disc compact, trace Peripapillary atrophy inferiorly, Pink and Sharp compact, Pink and Sharp   C/D Ratio 0.1 0.1  Macula Flat, Blunted  foveal reflex, RPE mottling and clumping; drusen; no heme or edema flat, Blunted foveal reflex, RPE mottling and clumping, ERM - stable; drusen; no heme or edema   Vessels mild attenuated, mild Tortuousity attenuated, Tortuous   Periphery Attached, paving stone at 0700, mild Reticular degeneration attached           Refraction     Wearing Rx       Sphere Cylinder Axis Add   Right [place]   +2.75   Left -1.00 +0.50 178 +2.75         Manifest Refraction (Auto)       Sphere Cylinder Axis Dist VA   Right +0.25 +1.00 124 20/20-3   Left -1.75 +1.00 016 20/30-2            IMAGING AND PROCEDURES  Imaging and Procedures for 08/29/17  OCT, Retina - OU - Both Eyes       Right Eye Quality was good. Central Foveal Thickness: 346. Progression has been stable. Findings include no IRF, no SRF, retinal drusen , normal foveal contour (Persistent drusen - stable from prior, blunted foveal countour).   Left Eye Quality was good. Central Foveal Thickness: 405. Progression has been stable. Findings include no IRF, no SRF, abnormal foveal contour, retinal drusen , epiretinal membrane.   Notes Images taken, stored on drive  Diagnosis / Impression:   nonexudative ARMD OU -- persistent drusen -- stable ERM OS -- mild -- stable from prior  Clinical management:  See below  Abbreviations: NFP - Normal foveal profile. CME - cystoid macular edema. PED - pigment epithelial detachment. IRF - intraretinal fluid. SRF - subretinal fluid. EZ - ellipsoid zone. ERM - epiretinal membrane. ORA - outer retinal atrophy. ORT - outer retinal tubulation. SRHM - subretinal hyper-reflective material             ASSESSMENT/PLAN:    ICD-10-CM   1. Intermediate stage nonexudative age-related macular degeneration of both eyes  H35.3132 OCT, Retina - OU - Both Eyes    2. Epiretinal membrane (ERM) of left eye  H35.372     3. Pseudophakia of both eyes  Z96.1     4. Dry eyes  H04.123      1. Age  related macular degeneration, non-exudative, both eyes  - mild-intermediate stage OU -- stable drusen on OCT  - The incidence, anatomy, and pathology of dry AMD, risk of progression, and the AREDS and AREDS 2 study including smoking risks discussed with patient.  - continue amsler grid monitoring  - recommend f/u 9-12 months, sooner prn -- DFE/OCT  2. Epiretinal membrane, left eye   - very mild ERM, BCVA 20/25+ with no metamorphopsia OS  - OCT and exam stable from prior -- reviewed all OCTs dating back to 2018 -- no significant change  - no surgical intervention indicated at this time  - monitor  - f/u in 9-12 mos  3. Pseudophakia OU  - s/p CE/IOL OU w/ Dr. Herbert Deaner  - beautiful surgeries, doing well  - monitor  4. Dry Eyes OU  - BCVA remains 20/20 OD, OS improved to 20/25  - exam shows persistent PEE (1-2+), ABMD, decreased TBUT and tear film debris  - recommend hot compresses OU BID  - recommend artificial tears and lubricating ointment as needed   Ophthalmic Meds Ordered this visit:  No orders of the defined types were placed in this encounter.    Return for f/u 9-12 months, non-exu ARMD OU, DFE,  OCT.  There are no Patient Instructions on file for this visit.  This document serves as a record of services personally performed by Gardiner Sleeper, MD, PhD. It was created on their behalf by Estill Bakes, COT an ophthalmic technician. The creation of this record is the provider's dictation and/or activities during the visit.    Electronically signed by: Estill Bakes, COT 1.30.23 @ 12:31 AM   This document serves as a record of services personally performed by Gardiner Sleeper, MD, PhD. It was created on their behalf by San Jetty. Owens Shark, OA an ophthalmic technician. The creation of this record is the provider's dictation and/or activities during the visit.    Electronically signed by: San Jetty. Owens Shark, [place] 01.31.2023 12:31 AM   Gardiner Sleeper, M.D., Ph.D. Diseases & Surgery of  the Retina and Vitreous [facility]  I have reviewed the above documentation for accuracy and completeness, and I agree with the above. Gardiner Sleeper, M.D., Ph.D. 07/24/21 12:33 AM   Abbreviations: M myopia (nearsighted); A astigmatism; H hyperopia (farsighted); P presbyopia; Mrx spectacle prescription;  CTL contact lenses; OD right eye; OS left eye; OU both eyes  XT exotropia; ET esotropia; PEK punctate epithelial keratitis; PEE punctate epithelial erosions; DES dry eye syndrome; MGD meibomian gland dysfunction; ATs artificial tears; PFAT's preservative free artificial tears; [place] nuclear sclerotic cataract; PSC posterior subcapsular cataract; ERM epi-retinal membrane; PVD posterior vitreous detachment; RD retinal detachment; DM diabetes mellitus; DR diabetic retinopathy; NPDR non-proliferative diabetic retinopathy; PDR proliferative diabetic retinopathy; CSME clinically significant macular edema; DME diabetic macular edema; dbh dot blot hemorrhages; CWS cotton wool spot; POAG primary open angle glaucoma; C/D cup-to-disc ratio; HVF humphrey visual field; GVF goldmann visual field; OCT optical coherence tomography; IOP intraocular pressure; BRVO Branch retinal vein occlusion; CRVO central retinal vein occlusion; CRAO central retinal artery occlusion; BRAO branch retinal artery occlusion; RT retinal tear; SB scleral buckle; PPV pars plana vitrectomy; VH Vitreous hemorrhage; PRP panretinal laser photocoagulation; IVK intravitreal kenalog; VMT vitreomacular traction; MH Macular hole;  NVD neovascularization of the disc; NVE neovascularization elsewhere; AREDS age related eye disease study; ARMD age related macular degeneration; POAG primary open angle glaucoma; EBMD epithelial/anterior basement membrane dystrophy; ACIOL anterior chamber intraocular lens; IOL intraocular lens; PCIOL posterior chamber intraocular lens; Phaco/IOL phacoemulsification with intraocular lens placement; [place]  photorefractive keratectomy; LASIK laser assisted in situ keratomileusis; HTN hypertension; DM diabetes mellitus; COPD chronic obstructive pulmonary disease

## 2021-07-20 ENCOUNTER — Encounter (INDEPENDENT_AMBULATORY_CARE_PROVIDER_SITE_OTHER): Payer: Self-pay | Admitting: Ophthalmology

## 2021-07-20 ENCOUNTER — Ambulatory Visit (INDEPENDENT_AMBULATORY_CARE_PROVIDER_SITE_OTHER): Payer: Medicare Other | Admitting: Ophthalmology

## 2021-07-20 ENCOUNTER — Other Ambulatory Visit: Payer: Self-pay

## 2021-07-20 DIAGNOSIS — H04123 Dry eye syndrome of bilateral lacrimal glands: Secondary | ICD-10-CM

## 2021-07-20 DIAGNOSIS — Z961 Presence of intraocular lens: Secondary | ICD-10-CM | POA: Diagnosis not present

## 2021-07-20 DIAGNOSIS — H353132 Nonexudative age-related macular degeneration, bilateral, intermediate dry stage: Secondary | ICD-10-CM

## 2021-07-20 DIAGNOSIS — H35372 Puckering of macula, left eye: Secondary | ICD-10-CM | POA: Diagnosis not present

## 2021-07-24 ENCOUNTER — Encounter (INDEPENDENT_AMBULATORY_CARE_PROVIDER_SITE_OTHER): Payer: Self-pay | Admitting: Ophthalmology

## 2021-08-17 DIAGNOSIS — H524 Presbyopia: Secondary | ICD-10-CM | POA: Diagnosis not present

## 2021-08-25 ENCOUNTER — Ambulatory Visit: Payer: Medicare HMO | Admitting: Cardiology

## 2021-08-31 ENCOUNTER — Other Ambulatory Visit (HOSPITAL_COMMUNITY): Payer: Self-pay | Admitting: *Deleted

## 2021-09-01 ENCOUNTER — Other Ambulatory Visit: Payer: Self-pay

## 2021-09-01 ENCOUNTER — Ambulatory Visit (HOSPITAL_COMMUNITY)
Admission: RE | Admit: 2021-09-01 | Discharge: 2021-09-01 | Disposition: A | Discharge status: Home / Self Care | Payer: Medicare Other | Source: Ambulatory Visit | Attending: Internal Medicine | Admitting: Internal Medicine

## 2021-09-01 DIAGNOSIS — M810 Age-related osteoporosis without current pathological fracture: Secondary | ICD-10-CM | POA: Insufficient documentation

## 2021-09-01 MED ORDER — DENOSUMAB 60 MG/ML ~~LOC~~ SOSY
60.0000 mg | PREFILLED_SYRINGE | Freq: Once | SUBCUTANEOUS | Status: AC
  Administered 2021-09-01: 60 mg via SUBCUTANEOUS

## 2021-09-01 MED ORDER — DENOSUMAB 60 MG/ML ~~LOC~~ SOSY
PREFILLED_SYRINGE | SUBCUTANEOUS | Status: AC
  Filled 2021-09-01: qty 1

## 2021-09-07 DIAGNOSIS — M810 Age-related osteoporosis without current pathological fracture: Secondary | ICD-10-CM | POA: Diagnosis not present

## 2021-09-07 DIAGNOSIS — R5383 Other fatigue: Secondary | ICD-10-CM | POA: Diagnosis not present

## 2021-09-07 DIAGNOSIS — I10 Essential (primary) hypertension: Secondary | ICD-10-CM | POA: Diagnosis not present

## 2021-09-07 DIAGNOSIS — K219 Gastro-esophageal reflux disease without esophagitis: Secondary | ICD-10-CM | POA: Diagnosis not present

## 2021-10-08 ENCOUNTER — Other Ambulatory Visit: Payer: Self-pay | Admitting: Gastroenterology

## 2021-10-08 DIAGNOSIS — R109 Unspecified abdominal pain: Secondary | ICD-10-CM

## 2021-10-08 DIAGNOSIS — R194 Change in bowel habit: Secondary | ICD-10-CM

## 2021-10-08 DIAGNOSIS — K589 Irritable bowel syndrome without diarrhea: Secondary | ICD-10-CM | POA: Diagnosis not present

## 2021-10-20 NOTE — Progress Notes (Signed)
?  ?Cardiology Office Note ? ? ?Date:  10/21/2021  ? ?ID:  Michelle Keith, DOB Nov 10, 1931, MRN [id number] ? ?PCP:  [name], MD ?Cardiologist:   Minus Breeding, MD  ? ?Chief Complaint  ?Patient presents with  ? Palpitations  ? ? ?  ?History of Present Illness: ?Michelle Keith is a 88 y.o. female who presents for follow up of chest pain. She did have a history of syncope. She's had a workup that has included a negative stress perfusion study in 2016. Of note there is mention of a possible VSD in her primary care notes. However, I reviewed the echo results in 2007 and don't see mention of this. She has  had palpitations and has been intolerant of calcium channel blockers and beta blockers.     She wore a Holter in  2017.  There were no significant arrhythmias.  Since the last visit she called in January.  She was feeling some palpitations.  She had a couple of episodes of hypertension.  It was suggested that she get a Chad mobile device. ? ?She said that she does get her palpitations in the morning when she first gets up.  She has to sit on the side of the bed.  However, if she does not jump up and move quickly she does okay.  Her blood pressures are higher in the morning to but when reviewing this with her it seems like her average is typically in the 130s.  She did not tolerate higher titration of her beta-blocker in the she has not had any presyncope or syncope.  She has not had any new PND or orthopnea.  She will be short of breath if she climbs an incline but she walks in her hallway and takes the stairs when she can and she can do this without shortness of breath. ? ? ?Past Medical History:  ?Diagnosis Date  ? Dysautonomia ([place])   ? Esophageal stricture   ? GERD (gastroesophageal reflux disease)   ? Hiatal hernia   ? IBS (irritable bowel syndrome)   ? Macular degeneration   ? Dry OU  ? Osteopenia   ? Osteoporosis   ? Right thyroid nodule   ? Squamous cell skin cancer   ? Syncope   ? ? ?Past Surgical History:   ?Procedure Laterality Date  ? C-EYE SURGERY PROCEDURE    ? 30 yrs ago  ? Cataract extraction    ? CATARACT EXTRACTION Bilateral 2001  ? Dr. Herbert Deaner  ? COLONOSCOPY  2009  ? Also 2002  ? ESOPHAGEAL DILATION    ? x 4  ? ESOPHAGEAL MANOMETRY    ? ESOPHAGOGASTRODUODENOSCOPY  Multiple  ? Exploratory laparoscopy    ? EYE SURGERY Bilateral   ? Cat Sx - Dr. Herbert Deaner  ? NISSEN FUNDOPLICATION    ? TOTAL ABDOMINAL HYSTERECTOMY    ? ? ? ?Current Outpatient Medications  ?Medication Sig Dispense Refill  ? Calcium-Vitamin D-Vitamin K (VIACTIV PO) Take 1 tablet by mouth 2 (two) times daily.    ? cholecalciferol (VITAMIN D) 1000 units tablet Vitamin D    ? cyanocobalamin 1000 MCG tablet Take 1,000 mcg by mouth daily.    ? denosumab (PROLIA) 60 MG/ML SOSY injection Inject 60 mg into the skin once. 2 times a year    ? metoprolol tartrate (LOPRESSOR) 25 MG tablet Pt takes 1 1/2 tablet by mouth daily    ? Multiple Vitamins-Minerals (PRESERVISION AREDS 2 PO) Take by mouth.    ?  pantoprazole (PROTONIX) 40 MG tablet Take 40 mg by mouth daily as needed (heartburn). Patient states she takes it every other day prn    ? valACYclovir (VALTREX) 500 MG tablet Take 500 mg by mouth as needed.    ? zolpidem (AMBIEN) 10 MG tablet Take 2.5 mg by mouth daily as needed for sleep.     ? ?No current facility-administered medications for this visit.  ? ? ?Allergies:   Epinephrine, Lidocaine, Aspirin, Atenolol, Clarithromycin, Cortisone, Omeprazole magnesium, Penicillins, Pindolol, Pseudoephedrine-guaifenesin, Sulfonamide derivatives, Tetracycline, Glycol stearate, and Valsartan  ? ? ?ROS:  Please see the history of present illness.   Otherwise, review of systems are positive for none.  All other systems are reviewed and negative.  ? ? ?PHYSICAL EXAM: ?VS:  BP (!) 160/66   Pulse (!) 59   Ht '5\' 5"'$  (1.651 m)   Wt 125 lb 3.2 oz (56.8 kg)   SpO2 99%   BMI 20.83 kg/m?  , BMI Body mass index is 20.83 kg/m?.  ?GENERAL:  Well appearing ?NECK:  No jugular venous  distention, waveform within normal limits, carotid upstroke brisk and symmetric, no bruits, no thyromegaly ?LUNGS:  Clear to auscultation bilaterally ?CHEST:  Unremarkable ?HEART:  PMI not displaced or sustained,S1 and S2 within normal limits, no S3, no S4, no clicks, no rubs, no murmurs ?ABD:  Flat, positive bowel sounds normal in frequency in pitch, no bruits, no rebound, no guarding, no midline pulsatile mass, no hepatomegaly, no splenomegaly ?EXT:  2 plus pulses throughout, mild ankle left edema, no cyanosis no clubbing ? ?EKG:  EKG is   ordered today. ?Sinus rhythm, rate 59, axis within normal limits, intervals within normal limits, no acute ST-T wave changes. PACs.  ? ?Recent Labs: ?02/14/2021: ALT 14 ?02/20/2021: BUN 8; Creatinine, Ser 0.75; Hemoglobin 12.2; Platelets 349; Potassium 4.2; Sodium 139  ? ? ?Lipid Panel ?No results found for: CHOL, TRIG, HDL, CHOLHDL, VLDL, LDLCALC, LDLDIRECT ?  ? ?Wt Readings from Last 3 Encounters:  ?10/21/21 125 lb 3.2 oz (56.8 kg)  ?02/14/21 125 lb 10.6 oz (57 kg)  ?12/17/20 127 lb 6.4 oz (57.8 kg)  ?  ?Lab Results  ?Component Value Date  ? TSH 3.05 01/13/2016  ? ? ? ?Other studies Reviewed: ?Additional studies/ records that were reviewed today include: Labs ?Review of the above records demonstrates:    See elsewhere ? ? ?ASSESSMENT AND PLAN: ? ? ?PALPITATIONS: She is going to put up with a few palpitations in the morning we talked about precautions so that she does not jump up to have a drop in her blood pressure.  However, I do not think there is any need for further evaluation with testing or up titration of her meds.  She is up-to-date with blood work and had normal electrolytes earlier this year.  TSH was normal. ? ?HTN:   Her blood pressure is labile but overall controlled.  She will continue the meds as listed.  She is sensitive to medications.  No change in therapy.  ? ?Current medicines are reviewed at length with the patient today.  The patient does not have concerns  regarding medicines. ? ?The following changes have been made: None ? ?Labs/ tests ordered today include:    None ? ?No orders of the defined types were placed in this encounter. ? ? ? ? ?Disposition:   FU with me in 12 months per her request.   ? ? ?Signed, ?Minus Breeding, MD  ?10/21/2021 12:11 PM    ?[facility] ?

## 2021-10-21 ENCOUNTER — Ambulatory Visit: Payer: Medicare Other | Admitting: Cardiology

## 2021-10-21 ENCOUNTER — Encounter: Payer: Self-pay | Admitting: Cardiology

## 2021-10-21 VITALS — BP 160/66 | HR 59 | Ht 65.0 in | Wt 125.2 lb

## 2021-10-21 DIAGNOSIS — I10 Essential (primary) hypertension: Secondary | ICD-10-CM

## 2021-10-21 DIAGNOSIS — R002 Palpitations: Secondary | ICD-10-CM | POA: Diagnosis not present

## 2021-10-21 NOTE — Patient Instructions (Signed)
Medication Instructions:  ?Continue same medications ?*If you need a refill on your cardiac medications before your next appointment, please call your pharmacy* ? ? ?Lab Work: ?None ordered ? ? ?Testing/Procedures: ?None ordered ? ? ?Follow-Up: ?At [facility], you and your health needs are our priority.  As part of our continuing mission to provide you with exceptional heart care, we have created designated Provider Care Teams.  These Care Teams include your primary Cardiologist (physician) and Advanced Practice Providers (APPs -  Physician Assistants and Nurse Practitioners) who all work together to provide you with the care you need, when you need it. ? ?We recommend signing up for the patient portal called "MyChart".  Sign up information is provided on this After Visit Summary.  MyChart is used to connect with patients for Virtual Visits (Telemedicine).  Patients are able to view lab/test results, encounter notes, upcoming appointments, etc.  Non-urgent messages can be sent to your provider as well.   ?To learn more about what you can do with MyChart, go to [email or web address].   ? ?Your next appointment:  1 year ?  ? ?The format for your next appointment: Office ? ? ?Provider:  Dr.Hocherein ? ? ?Important Information About Sugar ? ? ? ? ? ? ?

## 2021-11-08 ENCOUNTER — Other Ambulatory Visit: Payer: Medicare Other

## 2021-11-24 ENCOUNTER — Other Ambulatory Visit: Payer: Medicare Other

## 2021-12-16 DIAGNOSIS — K589 Irritable bowel syndrome without diarrhea: Secondary | ICD-10-CM | POA: Diagnosis not present

## 2021-12-20 NOTE — Progress Notes (Signed)
[facility] Note  12/28/2021     CHIEF COMPLAINT Patient presents for Eye Problem   HISTORY OF PRESENT ILLNESS: Michelle Keith is a 88 y.o. female who presents to the clinic today for:  HPI   Patient has had her glasses remade a few times because her vision has not been clear.  She has these special "sick spells" for several years off and on.  For the past 2-3 months she will see colors in vision when she has the spells, OU. Also, when she turns the lights off at night she will see gray spots in vision. Denies FOLs.  Last edited by Leonie Douglas, COA on 12/28/2021  1:08 PM.     Patient states she has not been seeing fairly well but got new glasses and feels that the vision is improved with glasses. She feels that she needs to wear the glasses to drive. Sick spells consist of needing to lay down and sees colors.   Referring physician: [name], MD [place],   [id number]  HISTORICAL INFORMATION:   Selected notes from the MEDICAL RECORD NUMBER Referred by Dr. Bing Plume for evaluation of AMD OU   CURRENT MEDICATIONS: No current outpatient medications on file. (Ophthalmic Drugs)   No current facility-administered medications for this visit. (Ophthalmic Drugs)   Current Outpatient Medications (Other)  Medication Sig   Calcium-Vitamin D-Vitamin K (VIACTIV PO) Take 1 tablet by mouth 2 (two) times daily.   cholecalciferol (VITAMIN D) 1000 units tablet Vitamin D   cyanocobalamin 1000 MCG tablet Take 1,000 mcg by mouth daily.   denosumab (PROLIA) 60 MG/ML SOSY injection Inject 60 mg into the skin once. 2 times a year   metoprolol tartrate (LOPRESSOR) 25 MG tablet Pt takes 1 1/2 tablet by mouth daily   Multiple Vitamins-Minerals (PRESERVISION AREDS 2 PO) Take by mouth.   pantoprazole (PROTONIX) 40 MG tablet Take 40 mg by mouth daily as needed (heartburn). Patient states she takes it every other day prn   valACYclovir (VALTREX) 500 MG tablet  Take 500 mg by mouth as needed.   zolpidem (AMBIEN) 10 MG tablet Take 2.5 mg by mouth daily as needed for sleep.    No current facility-administered medications for this visit. (Other)   REVIEW OF SYSTEMS: ROS   Positive for: Gastrointestinal, Musculoskeletal, Cardiovascular, Eyes Negative for: Constitutional, Neurological, Skin, Genitourinary, HENT, Endocrine, Respiratory, Psychiatric, Allergic/Imm, Heme/Lymph Last edited by Leonie Douglas, COA on 12/28/2021  1:08 PM.      ALLERGIES Allergies  Allergen Reactions   Epinephrine Anaphylaxis   Lidocaine Anaphylaxis   Aspirin    Atenolol    Clarithromycin    Cortisone Other (See Comments)   Omeprazole Magnesium Other (See Comments)    Ask at office visit   Penicillins    Pindolol Diarrhea    Fatigue, hip pain   Pseudoephedrine-Guaifenesin Other (See Comments)   Sulfonamide Derivatives    Tetracycline Hives   Glycol Stearate Palpitations   Valsartan Palpitations   PAST MEDICAL HISTORY Past Medical History:  Diagnosis Date   Dysautonomia ([place])    Esophageal stricture    GERD (gastroesophageal reflux disease)    Hiatal hernia    IBS (irritable bowel syndrome)    Macular degeneration    Dry OU   Osteopenia    Osteoporosis    Right thyroid nodule    Squamous cell skin cancer    Syncope    Past Surgical History:  Procedure Laterality Date  C-EYE SURGERY PROCEDURE     30 yrs ago   Cataract extraction     CATARACT EXTRACTION Bilateral 2001   Dr. Herbert Deaner   COLONOSCOPY  2009   Also 2002   ESOPHAGEAL DILATION     x 4   ESOPHAGEAL MANOMETRY     ESOPHAGOGASTRODUODENOSCOPY  Multiple   Exploratory laparoscopy     EYE SURGERY Bilateral    Cat Sx - Dr. Dorene Ar FUNDOPLICATION     TOTAL ABDOMINAL HYSTERECTOMY     FAMILY HISTORY Family History  Problem Relation Age of Onset   Multiple myeloma Father        died age 57   Heart failure Mother        died age 70   SOCIAL HISTORY Social History   Tobacco Use    Smoking status: Never   Smokeless tobacco: Never  Vaping Use   Vaping Use: Never used  Substance Use Topics   Alcohol use: No    Alcohol/week: 0.0 standard drinks of alcohol   Drug use: No       OPHTHALMIC EXAM:  Base Eye Exam     Visual Acuity (Snellen - Linear)       Right Left   Dist cc 20/25 +1 20/30 -2   Dist ph cc NI NI         Tonometry (Tonopen, 1:28 PM)       Right Left   Pressure 16 16         Pupils       Dark Light Shape React APD   Right 3 2 Round Brisk None   Left 3 2 Round Brisk None         Visual Fields (Counting fingers)       Left Right    Full Full         Neuro/Psych     Oriented x3: Yes   Mood/Affect: Normal         Dilation     Both eyes: 1.0% Mydriacyl, 2.5% Phenylephrine @ 1:28 PM           Slit Lamp and Fundus Exam     External Exam       Right Left   External Normal Normal         Slit Lamp Exam       Right Left   Lids/Lashes Dermatochalasis - upper lid Dermatochalasis - upper lid   Conjunctiva/Sclera White and quiet White and quiet   Cornea arcus, 1+ inferior peripheral Punctate epithelial erosions, trace Debris in tear film Arcus; 2+Punctate epithelial erosions, +Debris in tear film, EBMD, peripheral corneal haze   Anterior Chamber Deep and quiet Deep and quiet   Iris Round, dilated Round, dilated   Lens PCIOL; open PC PCIOL; 1+ PCO non central   Anterior Vitreous Vitreous syneresis, PVD, vitreous condensations Vitreous syneresis, PVD, vitreous condensations         Fundus Exam       Right Left   Disc compact, trace peripapillary atrophy inferiorly, Pink and Sharp compact, Pink and Sharp   C/D Ratio 0.1 0.1   Macula Flat, Blunted foveal reflex, RPE mottling and clumping; drusen; no heme or edema flat, Blunted foveal reflex, RPE mottling and clumping, ERM - stable; drusen; no heme or edema   Vessels mild attenuated, mild tortuousity attenuated, Tortuous   Periphery Attached, paving stone at  0700, mild Reticular degeneration attached  Refraction     Wearing Rx       Sphere Cylinder Axis Add   Right +1.00 +0.50 146 +2.75   Left -1.25 +0.75 003 +2.75         Manifest Refraction       Sphere Cylinder Axis Dist VA   Right +1.00 +1.25 165 20/20   Left -1.50 +1.25 005 NI            IMAGING AND PROCEDURES  Imaging and Procedures for 08/29/17  OCT, Retina - OU - Both Eyes       Right Eye Quality was good. Central Foveal Thickness: 341. Progression has been stable. Findings include normal foveal contour, no IRF, no SRF, retinal drusen (Persistent drusen - stable from prior, blunted foveal countour).   Left Eye Quality was good. Central Foveal Thickness: 408. Progression has been stable. Findings include no IRF, no SRF, abnormal foveal contour, retinal drusen , epiretinal membrane.   Notes Images taken, stored on drive  Diagnosis / Impression:   nonexudative ARMD OU -- persistent drusen -- stable ERM OS -- mild -- stable from prior  Clinical management:  See below  Abbreviations: NFP - Normal foveal profile. CME - cystoid macular edema. PED - pigment epithelial detachment. IRF - intraretinal fluid. SRF - subretinal fluid. EZ - ellipsoid zone. ERM - epiretinal membrane. ORA - outer retinal atrophy. ORT - outer retinal tubulation. SRHM - subretinal hyper-reflective material              ASSESSMENT/PLAN:    ICD-10-CM   1. Intermediate stage nonexudative age-related macular degeneration of both eyes  H35.3132 OCT, Retina - OU - Both Eyes    2. Epiretinal membrane (ERM) of left eye  H35.372     3. Pseudophakia of both eyes  Z96.1     4. Dry eyes  H04.123       1. Age related macular degeneration, non-exudative, both eyes  - mild-intermediate stage OU -- stable drusen on OCT  - The incidence, anatomy, and pathology of dry AMD, risk of progression, and the AREDS and AREDS 2 study including smoking risks discussed with patient.  -  patient was instructed to continue amsler grid monitoring  - recommend f/u 8 months, sooner prn -- DFE/OCT  2. Epiretinal membrane, left eye   - very mild ERM, BCVA 20/30+ with no metamorphopsia OS  - OCT and exam stable from prior -- reviewed all OCTs dating back to 2018 -- no significant change  - no surgical intervention indicated at this time  - will continue to monitor  - recommend f/u 8 months, sooner prn -- DFE/OCT  3. Pseudophakia OU  - s/p CE/IOL OU w/ Dr. Herbert Deaner  - beautiful surgeries, doing well  - monitor  4. Dry Eyes OU  - BCVA remains 20/20 OD, OS 20/30  - exam shows persistent PEE (1-2+), ABMD, decreased TBUT and tear film debris  - recommend hot compresses OU BID  - recommend artificial tears and lubricating ointment as needed. Patient was instructed to use Thera tears three times a day.   Ophthalmic Meds Ordered this visit:  No orders of the defined types were placed in this encounter.    Return for 8 months, DFE, OCT.  There are no Patient Instructions on file for this visit.  This document serves as a record of services personally performed by Gardiner Sleeper, MD, PhD. It was created on their behalf by Renaldo Reel, [place] an ophthalmic technician. The creation of this  record is the provider's dictation and/or activities during the visit.    Electronically signed by:  Renaldo Reel, COT 12/20/21  1:56 PM  This document serves as a record of services personally performed by Gardiner Sleeper, MD, PhD. It was created on their behalf by Renaldo Reel, [place] an ophthalmic technician. The creation of this record is the provider's dictation and/or activities during the visit.    Electronically signed by:  Renaldo Reel, COT  12/28/21 1:56 PM     Gardiner Sleeper, M.D., Ph.D. Diseases & Surgery of the Retina and Vitreous [facility]: M myopia (nearsighted); A astigmatism; H hyperopia (farsighted); P  presbyopia; Mrx spectacle prescription;  CTL contact lenses; OD right eye; OS left eye; OU both eyes  XT exotropia; ET esotropia; PEK punctate epithelial keratitis; PEE punctate epithelial erosions; DES dry eye syndrome; MGD meibomian gland dysfunction; ATs artificial tears; PFAT's preservative free artificial tears; [place] nuclear sclerotic cataract; PSC posterior subcapsular cataract; ERM epi-retinal membrane; PVD posterior vitreous detachment; RD retinal detachment; DM diabetes mellitus; DR diabetic retinopathy; NPDR non-proliferative diabetic retinopathy; PDR proliferative diabetic retinopathy; CSME clinically significant macular edema; DME diabetic macular edema; dbh dot blot hemorrhages; CWS cotton wool spot; POAG primary open angle glaucoma; C/D cup-to-disc ratio; HVF humphrey visual field; GVF goldmann visual field; OCT optical coherence tomography; IOP intraocular pressure; BRVO Branch retinal vein occlusion; CRVO central retinal vein occlusion; CRAO central retinal artery occlusion; BRAO branch retinal artery occlusion; RT retinal tear; SB scleral buckle; PPV pars plana vitrectomy; VH Vitreous hemorrhage; PRP panretinal laser photocoagulation; IVK intravitreal kenalog; VMT vitreomacular traction; MH Macular hole;  NVD neovascularization of the disc; NVE neovascularization elsewhere; AREDS age related eye disease study; ARMD age related macular degeneration; POAG primary open angle glaucoma; EBMD epithelial/anterior basement membrane dystrophy; ACIOL anterior chamber intraocular lens; IOL intraocular lens; PCIOL posterior chamber intraocular lens; Phaco/IOL phacoemulsification with intraocular lens placement; [place] photorefractive keratectomy; LASIK laser assisted in situ keratomileusis; HTN hypertension; DM diabetes mellitus; COPD chronic obstructive pulmonary disease

## 2021-12-27 ENCOUNTER — Other Ambulatory Visit: Payer: Medicare Other

## 2021-12-28 ENCOUNTER — Encounter (INDEPENDENT_AMBULATORY_CARE_PROVIDER_SITE_OTHER): Payer: Self-pay | Admitting: Ophthalmology

## 2021-12-28 ENCOUNTER — Ambulatory Visit (INDEPENDENT_AMBULATORY_CARE_PROVIDER_SITE_OTHER): Payer: Medicare Other | Admitting: Ophthalmology

## 2021-12-28 DIAGNOSIS — Z961 Presence of intraocular lens: Secondary | ICD-10-CM

## 2021-12-28 DIAGNOSIS — H04123 Dry eye syndrome of bilateral lacrimal glands: Secondary | ICD-10-CM | POA: Diagnosis not present

## 2021-12-28 DIAGNOSIS — H353132 Nonexudative age-related macular degeneration, bilateral, intermediate dry stage: Secondary | ICD-10-CM

## 2021-12-28 DIAGNOSIS — H35372 Puckering of macula, left eye: Secondary | ICD-10-CM

## 2021-12-29 ENCOUNTER — Encounter (INDEPENDENT_AMBULATORY_CARE_PROVIDER_SITE_OTHER): Payer: Self-pay | Admitting: Ophthalmology

## 2022-01-10 ENCOUNTER — Encounter: Payer: Self-pay | Admitting: Cardiology

## 2022-01-10 ENCOUNTER — Emergency Department (HOSPITAL_COMMUNITY): Payer: Medicare Other

## 2022-01-10 ENCOUNTER — Ambulatory Visit (INDEPENDENT_AMBULATORY_CARE_PROVIDER_SITE_OTHER): Payer: Medicare Other | Admitting: Cardiology

## 2022-01-10 ENCOUNTER — Encounter (HOSPITAL_COMMUNITY): Payer: Self-pay | Admitting: Emergency Medicine

## 2022-01-10 ENCOUNTER — Telehealth: Payer: Self-pay | Admitting: Cardiology

## 2022-01-10 ENCOUNTER — Emergency Department (HOSPITAL_COMMUNITY)
Admission: EM | Admit: 2022-01-10 | Discharge: 2022-01-10 | Disposition: A | Discharge status: Home / Self Care | Payer: Medicare Other | Attending: Emergency Medicine | Admitting: Emergency Medicine

## 2022-01-10 VITALS — BP 187/84 | HR 56 | Ht 65.0 in | Wt 128.0 lb

## 2022-01-10 DIAGNOSIS — I10 Essential (primary) hypertension: Secondary | ICD-10-CM | POA: Diagnosis not present

## 2022-01-10 DIAGNOSIS — R079 Chest pain, unspecified: Secondary | ICD-10-CM

## 2022-01-10 DIAGNOSIS — R0789 Other chest pain: Secondary | ICD-10-CM | POA: Diagnosis not present

## 2022-01-10 LAB — CBC
HCT: 40.2 % (ref 36.0–46.0)
Hemoglobin: 13.1 g/dL (ref 12.0–15.0)
MCH: 30.8 pg (ref 26.0–34.0)
MCHC: 32.6 g/dL (ref 30.0–36.0)
MCV: 94.4 fL (ref 80.0–100.0)
Platelets: 274 10*3/uL (ref 150–400)
RBC: 4.26 MIL/uL (ref 3.87–5.11)
RDW: 13.5 % (ref 11.5–15.5)
WBC: 7.1 10*3/uL (ref 4.0–10.5)
nRBC: 0 % (ref 0.0–0.2)

## 2022-01-10 LAB — BASIC METABOLIC PANEL
Anion gap: 6 (ref 5–15)
BUN: 10 mg/dL (ref 8–23)
CO2: 26 mmol/L (ref 22–32)
Calcium: 10.3 mg/dL (ref 8.9–10.3)
Chloride: 103 mmol/L (ref 98–111)
Creatinine, Ser: 0.82 mg/dL (ref 0.44–1.00)
GFR, Estimated: >60 mL/min (ref >60)
Glucose, Bld: 187 mg/dL — ABNORMAL HIGH (ref 70–99)
Potassium: 4.4 mmol/L (ref 3.5–5.1)
Sodium: 135 mmol/L (ref 135–145)

## 2022-01-10 LAB — TROPONIN I (HIGH SENSITIVITY): Troponin I (High Sensitivity): 3 ng/L (ref <18)

## 2022-01-10 LAB — I-STAT CREATININE, ED: Creatinine, Ser: 0.8 mg/dL (ref 0.44–1.00)

## 2022-01-10 MED ORDER — IOHEXOL 350 MG/ML SOLN
100.0000 mL | Freq: Once | INTRAVENOUS | Status: DC | PRN
Start: 2022-01-10 — End: 2022-01-10

## 2022-01-10 NOTE — ED Provider Notes (Signed)
[facility] DEPT Provider Note   CSN: [id number] Arrival date & time: 01/10/22  1338     History  Chief Complaint  Patient presents with   Chest Pain    Michelle Keith is a 88 y.o. female.  HPI 88 year old female presents for evaluation of chest pain.  Last night she had about 3 hours of chest pressure which briefly went to her back and then also subsequently went to both shoulders and upper arms.  No diaphoresis, nausea, vomiting, shortness of breath.  Felt generally weak and this persisted.  This morning she feels a little bit weak and off but her chest pain seems to have resolved.  Went to cardiology and did not see her regular cardiologist but saw another.  Due to significant hypertension they told her to come to the ER to rule out MI and get a TEE to rule out dissection.  Patient has chronic left lower extremity swelling that is unchanged.  She has a history of hypertension that is currently treated.  Denies smoking history, diabetes, hyperlipidemia.  Home Medications Prior to Admission medications   Medication Sig Start Date End Date Taking? Authorizing Provider  Calcium-Vitamin D-Vitamin K (VIACTIV PO) Take 1 tablet by mouth 2 (two) times daily.    [provider]  cholecalciferol (VITAMIN D) 1000 units tablet Vitamin D    [provider]  cyanocobalamin 1000 MCG tablet Take 1,000 mcg by mouth daily.    [provider]  denosumab (PROLIA) 60 MG/ML SOSY injection Inject 60 mg into the skin once. 2 times a year    [provider]  metoprolol tartrate (LOPRESSOR) 25 MG tablet Pt takes 1 1/2 tablet by mouth daily    [provider]  Multiple Vitamins-Minerals (PRESERVISION AREDS 2 PO) Take by mouth.    [provider]  pantoprazole (PROTONIX) 40 MG tablet Take 40 mg by mouth daily as needed (heartburn). Patient states she takes it every other day prn    [provider]  valACYclovir (VALTREX)  500 MG tablet Take 500 mg by mouth as needed.    [provider]  zolpidem (AMBIEN) 10 MG tablet Take 2.5 mg by mouth daily as needed for sleep.     [provider]      Allergies    Epinephrine, Lidocaine, Aspirin, Atenolol, Clarithromycin, Cortisone, Omeprazole magnesium, Penicillins, Pindolol, Pseudoephedrine-guaifenesin, Sulfonamide derivatives, Tetracycline, Glycol stearate, and Valsartan    Review of Systems   Review of Systems  Constitutional:  Negative for diaphoresis.  Respiratory:  Negative for shortness of breath.   Cardiovascular:  Positive for chest pain.  Gastrointestinal:  Negative for abdominal pain and vomiting.  Musculoskeletal:  Positive for back pain.    Physical Exam Updated Vital Signs BP (!) 151/60 (BP Location: Right Arm)   Pulse 65   Temp 97.6 F (36.4 C) (Oral)   Resp 12   Ht '5\' 5"'$  (1.651 m)   Wt 55.1 kg   SpO2 98%   BMI 20.21 kg/m  Physical Exam Vitals and nursing note reviewed.  Constitutional:      General: She is not in acute distress.    Appearance: She is well-developed. She is not ill-appearing or diaphoretic.  HENT:     Head: Normocephalic and atraumatic.  Cardiovascular:     Rate and Rhythm: Normal rate and regular rhythm.     Pulses:          Radial pulses are 2+ on the right side and 2+  on the left side.     Heart sounds: Normal heart sounds.  Pulmonary:     Effort: Pulmonary effort is normal.     Breath sounds: Normal breath sounds.  Abdominal:     Palpations: Abdomen is soft.     Tenderness: There is no abdominal tenderness.  Skin:    General: Skin is warm and dry.  Neurological:     Mental Status: She is alert.     ED Results / Procedures / Treatments   Labs (all labs ordered are listed, but only abnormal results are displayed) Labs Reviewed  BASIC METABOLIC PANEL - Abnormal; Notable for the following components:      Result Value   Glucose, Bld 187 (*)    All other components within normal limits   CBC  I-STAT CREATININE, ED  TROPONIN I (HIGH SENSITIVITY)  TROPONIN I (HIGH SENSITIVITY)    EKG EKG Interpretation  Date/Time:  Monday January 10 2022 13:50:44 EDT Ventricular Rate:  65 PR Interval:  137 QRS Duration: 98 QT Interval:  421 QTC Calculation: 438 R Axis:   -2 Text Interpretation: Sinus rhythm Consider left atrial enlargement Abnormal R-wave progression, early transition Confirmed by Sherwood Gambler [phone]) on 01/10/2022 2:10:34 PM  Radiology No results found.  Procedures Procedures   Medications Ordered in ED Medications  iohexol (OMNIPAQUE) 350 MG/ML injection 100 mL (has no administration in time range)    ED Course/ Medical Decision Making/ A&P                           Medical Decision Making Amount and/or Complexity of Data Reviewed Labs: ordered. Radiology: ordered.  Risk Prescription drug management.   Labs are unremarkable. Trop negative. Chest pain was last night, though she feels fatigued today. Based on cardiology note and assessment, there was concern for possible aortic dissection. I had multiple discussions with family.  Patient is uneasy about getting a CT with contrast as she has had friends passed away from receiving contrast.  She herself has never had an issue.  She was pretty hypertensive on arrival though this is steadily improving.  Son think she had anxiety she has had issues with that before.  I discussed there is not really a great test besides the CTA to emergently rule out aortic dissection.  We discussed that while blood pressure differences in the 2 arms can be a normal variant, in the setting of chest pain, back pain, hypertension it is also raising concern for dissection.  After multiple discussions, she and son want to not do the CT.  I discussed that while she probably does not have a dissection we cannot rule this out and they understand this as well as the potential consequences including death.   As far as the chest pain, her  initial ECG is overall unremarkable.  Second 1 seems to have limb lead misplacement.  She has declined x-ray as well.  For the chest pain she does have risk factors with her age and hypertension we discussed that she could have had angina without MI.  Offered admission and further expedited work-up but she declines.  She wants to follow back up with cardiology.  We discussed that if at any point her symptoms recur or new symptoms develop she should return to the ER or call 911.        Final Clinical Impression(s) / ED Diagnoses Final diagnoses:  Nonspecific chest pain    Rx / DC  Orders ED Discharge Orders          Ordered    Ambulatory referral to Cardiology       Comments: If you have not heard from the Cardiology office within the next 72 hours please call [phone].   01/10/22 1530              Sherwood Gambler, MD 01/10/22 1552

## 2022-01-10 NOTE — ED Provider Triage Note (Signed)
Emergency Medicine Provider Triage Evaluation Note  Michelle Keith , a 88 y.o. female  was evaluated in triage.  Pt complains of chest heaviness.  Patient states that symptoms began when she was making dinner last night.  She describes chest heaviness with radiation to her upper back as well as bilateral upper extremities.  She notes episode lasted approximately 3 hours.  She took no medication for said episode.  She saw her cardiologist earlier this morning who told her to come to the emergency department for further evaluation of symptoms.  She is currently not experiencing chest pain.  She denies associated shortness of breath, fever, chills, night sweats, abdominal pain, nausea, vomiting.  She does not note "feeling off since symptoms started.".  Review of Systems  Positive: See above Negative:   Physical Exam  BP (!) 160/93 (BP Location: Left Arm)   Pulse 68   Temp 98 F (36.7 C) (Oral)   Resp 18   Ht '5\' 5"'$  (1.651 m)   Wt 55.1 kg   SpO2 100%   BMI 20.21 kg/m  Gen:   Awake, no distress   Resp:  Normal effort  MSK:   Moves extremities without difficulty  Other:  No increase in lower extremity edema.   Medical Decision Making  Medically screening exam initiated at 2:05 PM.  Appropriate orders placed.  [name] was informed that the remainder of the evaluation will be completed by another provider, this initial triage assessment does not replace that evaluation, and the importance of remaining in the ED until their evaluation is complete.     Wilnette Kales, [place] 01/10/22 1406

## 2022-01-10 NOTE — Discharge Instructions (Signed)
If you develop recurrent, continued, or worsening chest pain, shortness of breath, fever, vomiting, abdominal or back pain, or any other new/concerning symptoms then return to the ER for evaluation.  

## 2022-01-10 NOTE — Telephone Encounter (Signed)
Pt c/o of Chest Pain: STAT if CP now or developed within 24 hours  1. Are you having CP right now? No   2. Are you experiencing any other symptoms (ex. SOB, nausea, vomiting, sweating)? No; BP was 179/105; 81  3. How long have you been experiencing CP? Last night; chest heaviness like pawn sitting on chest  4. Is your CP continuous or coming and going? continuous  5. Have you taken Nitroglycerin? No  ?

## 2022-01-10 NOTE — Progress Notes (Signed)
Cardiology Office Note:    Date:  01/10/2022   ID:  PENIEL BIEL, DOB Aug 05, 1931, MRN [id number]  PCP:  [name], MD  Cardiologist:  None  Electrophysiologist:  None   Referring MD: [name], MD   Chief Complaint  Patient presents with   Chest Pain    History of Present Illness:    Michelle Keith is a 88 y.o. female with a hx of esophageal stricture, GERD, hiatal hernia, IBS, squamous cell carcinoma who presents for acute visit with chest pain.  She reports she was making dinner last night and felt weak.  Started having heaviness in chest, radiated to her back.  Lasted for about 3 hours.  Was 3 out of 10 in intensity last night.  Reports currently 2 out of 10 in intensity.  Underwent Lexiscan Myoview 12/2014 for chest pain, showed normal perfusion.   Past Medical History:  Diagnosis Date   Dysautonomia ([place])    Esophageal stricture    GERD (gastroesophageal reflux disease)    Hiatal hernia    IBS (irritable bowel syndrome)    Macular degeneration    Dry OU   Osteopenia    Osteoporosis    Right thyroid nodule    Squamous cell skin cancer    Syncope     Past Surgical History:  Procedure Laterality Date   C-EYE SURGERY PROCEDURE     30 yrs ago   Cataract extraction     CATARACT EXTRACTION Bilateral 2001   Dr. Herbert Deaner   COLONOSCOPY  2009   Also 2002   ESOPHAGEAL DILATION     x 4   ESOPHAGEAL MANOMETRY     ESOPHAGOGASTRODUODENOSCOPY  Multiple   Exploratory laparoscopy     EYE SURGERY Bilateral    Cat Sx - Dr. Dorene Ar FUNDOPLICATION     TOTAL ABDOMINAL HYSTERECTOMY      Current Medications: No outpatient medications have been marked as taking for the 01/10/22 encounter (Office Visit) with Donato Heinz, MD.     Allergies:   Epinephrine, Lidocaine, Aspirin, Atenolol, Clarithromycin, Cortisone, Omeprazole magnesium, Penicillins, Pindolol, Pseudoephedrine-guaifenesin, Sulfonamide derivatives, Tetracycline, Glycol stearate, and Valsartan    Social History   Socioeconomic History   Marital status: Widowed    Spouse name: Not on file   Number of children: 2   Years of education: Not on file   Highest education level: Not on file  Occupational History   Occupation: Retired  Tobacco Use   Smoking status: Never   Smokeless tobacco: Never  Vaping Use   Vaping Use: Never used  Substance and Sexual Activity   Alcohol use: No    Alcohol/week: 0.0 standard drinks of alcohol   Drug use: No   Sexual activity: Not on file  Other Topics Concern   Not on file  Social History Narrative   Lives alone.  Husband in a nursing home after a stroke.        1 son one daughter   Social Determinants of Radio broadcast assistant Strain: Not on file  Food Insecurity: Not on file  Transportation Needs: Not on file  Physical Activity: Not on file  Stress: Not on file  Social Connections: Not on file     Family History: The patient's family history includes Heart failure in her mother; Multiple myeloma in her father.  ROS:   Please see the history of present illness.     All other systems reviewed and are negative.  EKGs/Labs/Other Studies  Reviewed:    The following studies were reviewed today:   EKG:   01/10/2022: Sinus bradycardia, rate 56, no ST abnormality  Recent Labs: 02/14/2021: ALT 14 02/20/2021: BUN 8; Creatinine, Ser 0.75; Hemoglobin 12.2; Platelets 349; Potassium 4.2; Sodium 139  Recent Lipid Panel No results found for: "CHOL", "TRIG", "HDL", "CHOLHDL", "VLDL", "LDLCALC", "LDLDIRECT"  Physical Exam:    VS:  BP (!) 187/84   Pulse (!) 56   Ht 5' 5" (1.651 m)   Wt 128 lb (58.1 kg)   SpO2 98%   BMI 21.30 kg/m     Wt Readings from Last 3 Encounters:  01/10/22 128 lb (58.1 kg)  10/21/21 125 lb 3.2 oz (56.8 kg)  02/14/21 125 lb 10.6 oz (57 kg)     GEN:  in no acute distress HEENT: Normal NECK: No JVD; No carotid bruits LYMPHATICS: No lymphadenopathy CARDIAC: RRR, no murmurs, rubs,  gallops RESPIRATORY:  Clear to auscultation without rales, wheezing or rhonchi  ABDOMEN: Soft, non-tender, non-distended MUSCULOSKELETAL:  No edema; No deformity  SKIN: Warm and dry NEUROLOGIC:  Alert and oriented x 3 PSYCHIATRIC:  Normal affect   ASSESSMENT:    1. Chest pain of uncertain etiology    PLAN:    Chest pain: She reported onset of chest heaviness last night that radiated to back, initially 3/10 in intensity, reports currently 2/10.  Her BP is significantly elevated, L arm 187/84 and R arm 204/81.  EKG unremarkable. -Given ongoing chest discomfort and considering significantly elevated blood pressure, recommend evaluation in the ED.  Recommend checking cardiac enzymes to rule out MI.  Also recommend CTA chest to r/o dissection given reported pain radiated to back, elevated BP, and BP differential between arms.  Patient is agreeable, will go to ED   Medication Adjustments/Labs and Tests Ordered: Current medicines are reviewed at length with the patient today.  Concerns regarding medicines are outlined above.  Orders Placed This Encounter  Procedures   EKG 12-Lead   No orders of the defined types were placed in this encounter.   Patient Instructions  Recommend proceeding to [facility]   Follow-Up: Your next appointment:   2 week(s)  The format for your next appointment:   In Person  Provider:   Dr. Hochrein or NP/PA      Signed, Christopher L Schumann, MD  01/10/2022 12:29 PM    [facility] 

## 2022-01-10 NOTE — ED Triage Notes (Signed)
Patient reports chest heaviness radiating to bilateral arms and back last night for approximately three hours. States was feeling weak this morning and saw cardiology. Reports cardiology sent for further evaluation of elevated BP. Patient denies chest pains at this time.

## 2022-01-10 NOTE — Patient Instructions (Signed)
Recommend proceeding to [facility]   Follow-Up: Your next appointment:   2 week(s)  The format for your next appointment:   In Person  Provider:   Dr. Percival Spanish or NP/PA

## 2022-01-10 NOTE — Telephone Encounter (Signed)
Received a call from patient she stated she had a episode of chest heaviness yesterday while preparing her supper.Stated she became very weak.Chest heaviness radiated into back and down into both upper arms.Episode lasted appox 3 hours.No chest heaviness this morning,she feels very weak.Appointment scheduled with DOD Dr.Schumann this morning at 11:30 am.

## 2022-01-11 ENCOUNTER — Ambulatory Visit (HOSPITAL_BASED_OUTPATIENT_CLINIC_OR_DEPARTMENT_OTHER): Payer: Medicare Other | Admitting: Family

## 2022-01-31 ENCOUNTER — Ambulatory Visit (INDEPENDENT_AMBULATORY_CARE_PROVIDER_SITE_OTHER): Payer: Medicare Other | Admitting: Physician Assistant

## 2022-01-31 ENCOUNTER — Encounter: Payer: Self-pay | Admitting: Physician Assistant

## 2022-01-31 VITALS — BP 160/80 | HR 60 | Ht 65.0 in | Wt 124.0 lb

## 2022-01-31 DIAGNOSIS — R0989 Other specified symptoms and signs involving the circulatory and respiratory systems: Secondary | ICD-10-CM

## 2022-01-31 DIAGNOSIS — R0789 Other chest pain: Secondary | ICD-10-CM

## 2022-01-31 NOTE — Patient Instructions (Addendum)
Medication Instructions:  TAKE Metoprolol Tartrate (Lopressor) 37.5 (1.5 tablets) at night and 12.5 mg in the mornings as needed if Systolic (top number) of blood pressure is greater than 160  *If you need a refill on your cardiac medications before your next appointment, please call your pharmacy*  Lab Work: NONE ordered at this time of appointment   If you have labs (blood work) drawn today and your tests are completely normal, you will receive your results only by: [place] (if you have MyChart) OR A paper copy in the mail If you have any lab test that is abnormal or we need to change your treatment, we will call you to review the results.  Testing/Procedures: Your physician has requested that you have an echocardiogram. Echocardiography is a painless test that uses sound waves to create images of your heart. It provides your doctor with information about the size and shape of your heart and how well your heart's chambers and valves are working. This procedure takes approximately one hour. There are no restrictions for this procedure.  Follow-Up: At [facility], you and your health needs are our priority.  As part of our continuing mission to provide you with exceptional heart care, we have created designated Provider Care Teams.  These Care Teams include your primary Cardiologist (physician) and Advanced Practice Providers (APPs -  Physician Assistants and Nurse Practitioners) who all work together to provide you with the care you need, when you need it.  Your next appointment:   4-6 week(s)  The format for your next appointment:   In Person  Provider:   App on a day Dr. Percival Spanish is in the office        Other Instructions CONTINUE to monitor blood pressure at home.   Important Information About Sugar

## 2022-01-31 NOTE — Progress Notes (Unsigned)
Cardiology Office Note:    Date:  02/03/2022   ID:  Michelle Keith, DOB 03/12/1932, MRN [id number]  PCP:  [name], [place] [facility] Cardiologist:  Minus Breeding, MD     Referring MD: [name], MD   Chief Complaint  Patient presents with   Follow-up    Seen for Dr. Percival Spanish    History of Present Illness:    Michelle Keith is a 88 y.o. female with a hx of esophageal stricture, GERD, hiatal hernia, IBS, squamous cell carcinoma and a history of syncope.  Myoview in 2016 was negative.  Although there was mention of possible VSD on her primary care doctor's note, however echocardiogram in 2007 did not mention any VSD.  She has a history of palpitation and has been intolerant of calcium channel blocker and beta-blockers.  Holter monitor in 2017 showed no significant arrhythmia.  Recent TSH was normal.  Patient was last seen by Dr. Percival Spanish on 10/21/2021.  Since then, patient was seen by Dr. Gardiner Rhyme for evaluation of chest pain on 01/10/2022, blood pressure was elevated at the time, with ongoing chest discomfort, patient was referred to the emergency room.  High-sensitivity troponin was negative x1.  Basic metabolic panel shows stable renal function and electrolyte. Initial cardiology recommendation is to get a CT to rule out dissection, however family was quite hesitant to obtain a CT with contrast as one of her friends died after receiving contrast and ultimately decided not to proceed with CT.  ED physician offer admission for further work-up however she also declined.  She was ultimately discharged from the emergency room.  Patient presents today for follow-up along with her daughter.  Her blood pressure remains elevated.  I ultimately increased her metoprolol to 37.5 mg twice a day and night and 12.5 mg in the morning.  Apparently her blood pressure typically is runs very high early in the morning however settles down by afternoon.  Therefore we will let her have a  slightly higher dose of metoprolol at nighttime to help cover for morning blood pressure.  As far as her atypical chest pain, I recommended echocardiogram.  She does not wish to have a CT with contrast.  I plan to reassess her symptoms in 4 to 6 weeks.  Past Medical History:  Diagnosis Date   Dysautonomia ([place])    Esophageal stricture    GERD (gastroesophageal reflux disease)    Hiatal hernia    IBS (irritable bowel syndrome)    Macular degeneration    Dry OU   Osteopenia    Osteoporosis    Right thyroid nodule    Squamous cell skin cancer    Syncope     Past Surgical History:  Procedure Laterality Date   C-EYE SURGERY PROCEDURE     30 yrs ago   Cataract extraction     CATARACT EXTRACTION Bilateral 2001   Dr. Herbert Deaner   COLONOSCOPY  2009   Also 2002   ESOPHAGEAL DILATION     x 4   ESOPHAGEAL MANOMETRY     ESOPHAGOGASTRODUODENOSCOPY  Multiple   Exploratory laparoscopy     EYE SURGERY Bilateral    Cat Sx - Dr. Dorene Ar FUNDOPLICATION     TOTAL ABDOMINAL HYSTERECTOMY      Current Medications: Current Meds  Medication Sig   Calcium-Vitamin D-Vitamin K (VIACTIV PO) Take 1 tablet by mouth 2 (two) times daily.   cholecalciferol (VITAMIN D) 1000 units tablet Vitamin D  cyanocobalamin 1000 MCG tablet Take 1,000 mcg by mouth daily.   denosumab (PROLIA) 60 MG/ML SOSY injection Inject 60 mg into the skin once. 2 times a year   metoprolol tartrate (LOPRESSOR) 25 MG tablet Take 37.5 mg (1.5 tablets) at night. May take 12.5 mg (half tablet) as needed if blood pressure is greater than 160   Multiple Vitamins-Minerals (PRESERVISION AREDS 2 PO) Take by mouth.   pantoprazole (PROTONIX) 40 MG tablet Take 40 mg by mouth daily as needed (heartburn). Patient states she takes it every other day prn   valACYclovir (VALTREX) 500 MG tablet Take 500 mg by mouth as needed.   zolpidem (AMBIEN) 10 MG tablet Take 2.5 mg by mouth daily as needed for sleep.      Allergies:   Epinephrine,  Lidocaine, Aspirin, Atenolol, Clarithromycin, Cortisone, Omeprazole magnesium, Penicillins, Pindolol, Pseudoephedrine-guaifenesin, Sulfonamide derivatives, Tetracycline, Glycol stearate, and Valsartan   Social History   Socioeconomic History   Marital status: Widowed    Spouse name: Not on file   Number of children: 2   Years of education: Not on file   Highest education level: Not on file  Occupational History   Occupation: Retired  Tobacco Use   Smoking status: Never   Smokeless tobacco: Never  Vaping Use   Vaping Use: Never used  Substance and Sexual Activity   Alcohol use: No    Alcohol/week: 0.0 standard drinks of alcohol   Drug use: No   Sexual activity: Not on file  Other Topics Concern   Not on file  Social History Narrative   Lives alone.  Husband in a nursing home after a stroke.        1 son one daughter   Social Determinants of Radio broadcast assistant Strain: Not on file  Food Insecurity: Not on file  Transportation Needs: Not on file  Physical Activity: Not on file  Stress: Not on file  Social Connections: Not on file     Family History: The patient's family history includes Heart failure in her mother; Multiple myeloma in her father.  ROS:   Please see the history of present illness.     All other systems reviewed and are negative.  EKGs/Labs/Other Studies Reviewed:    The following studies were reviewed today:  N/A  EKG:  EKG is  ordered today.  The ekg ordered today demonstrates normal sinus rhythm, no significant ST-T wave changes heart rate 59 bpm.  Occasional PACs  Recent Labs: 02/14/2021: ALT 14 01/10/2022: BUN 10; Creatinine, Ser 0.80; Hemoglobin 13.1; Platelets 274; Potassium 4.4; Sodium 135  Recent Lipid Panel No results found for: "CHOL", "TRIG", "HDL", "CHOLHDL", "VLDL", "LDLCALC", "LDLDIRECT"   Risk Assessment/Calculations:           Physical Exam:    VS:  BP (!) 160/80   Pulse 60   Ht _0  (1.651 m)   Wt 124 lb (56.2  kg)   SpO2 98%   BMI 20.63 kg/m        Wt Readings from Last 3 Encounters:  01/31/22 124 lb (56.2 kg)  01/10/22 121 lb 7 oz (55.1 kg)  01/10/22 128 lb (58.1 kg)     GEN:  Well nourished, well developed in no acute distress HEENT: Normal NECK: No JVD; No carotid bruits LYMPHATICS: No lymphadenopathy CARDIAC: RRR, no murmurs, rubs, gallops RESPIRATORY:  Clear to auscultation without rales, wheezing or rhonchi  ABDOMEN: Soft, non-tender, non-distended MUSCULOSKELETAL:  No edema; No deformity  SKIN: Warm and dry  NEUROLOGIC:  Alert and oriented x 3 PSYCHIATRIC:  Normal affect   ASSESSMENT:    1. Atypical chest pain    PLAN:    In order of problems listed above:  Atypical chest pain: She refused CT angiogram of the chest to rule out dissection.  She does not wish to have any contrast.  Suspicion for dissection is fairly low at this time.  We will obtain echocardiogram to assess wall motion and ejection fraction.  Labile hypertension: Her blood pressure has been labile recently, her blood pressure is the highest in the morning and become normal by afternoon time.  I recommended 37.5 mg of metoprolol at nighttime and 12.5 mg in the morning.           Medication Adjustments/Labs and Tests Ordered: Current medicines are reviewed at length with the patient today.  Concerns regarding medicines are outlined above.  Orders Placed This Encounter  Procedures   EKG 12-Lead   ECHOCARDIOGRAM COMPLETE   No orders of the defined types were placed in this encounter.   Patient Instructions  Medication Instructions:  TAKE Metoprolol Tartrate (Lopressor) 37.5 (1.5 tablets) at night and 12.5 mg in the mornings as needed if Systolic (top number) of blood pressure is greater than 160  *If you need a refill on your cardiac medications before your next appointment, please call your pharmacy*  Lab Work: NONE ordered at this time of appointment   If you have labs (blood work) drawn today  and your tests are completely normal, you will receive your results only by: [place] (if you have MyChart) OR A paper copy in the mail If you have any lab test that is abnormal or we need to change your treatment, we will call you to review the results.  Testing/Procedures: Your physician has requested that you have an echocardiogram. Echocardiography is a painless test that uses sound waves to create images of your heart. It provides your doctor with information about the size and shape of your heart and how well your heart's chambers and valves are working. This procedure takes approximately one hour. There are no restrictions for this procedure.  Follow-Up: At [facility], you and your health needs are our priority.  As part of our continuing mission to provide you with exceptional heart care, we have created designated Provider Care Teams.  These Care Teams include your primary Cardiologist (physician) and Advanced Practice Providers (APPs -  Physician Assistants and Nurse Practitioners) who all work together to provide you with the care you need, when you need it.  Your next appointment:   4-6 week(s)  The format for your next appointment:   In Person  Provider:   App on a day Dr. Percival Spanish is in the office        Other Instructions CONTINUE to monitor blood pressure at home.   Important Information About Sugar         Hilbert Corrigan, [place]  02/03/2022 12:00 AM    [place]

## 2022-02-02 ENCOUNTER — Encounter: Payer: Self-pay | Admitting: Physician Assistant

## 2022-02-11 DIAGNOSIS — M810 Age-related osteoporosis without current pathological fracture: Secondary | ICD-10-CM | POA: Diagnosis not present

## 2022-02-11 DIAGNOSIS — R5383 Other fatigue: Secondary | ICD-10-CM | POA: Diagnosis not present

## 2022-02-11 DIAGNOSIS — I10 Essential (primary) hypertension: Secondary | ICD-10-CM | POA: Diagnosis not present

## 2022-02-11 DIAGNOSIS — R55 Syncope and collapse: Secondary | ICD-10-CM | POA: Diagnosis not present

## 2022-02-14 ENCOUNTER — Ambulatory Visit (HOSPITAL_COMMUNITY): Payer: Medicare Other | Attending: Cardiology

## 2022-02-14 DIAGNOSIS — R0789 Other chest pain: Secondary | ICD-10-CM | POA: Insufficient documentation

## 2022-02-14 LAB — ECHOCARDIOGRAM COMPLETE
Area-P 1/2: 4.6 cm2
S' Lateral: 2.5 cm

## 2022-03-08 ENCOUNTER — Other Ambulatory Visit (HOSPITAL_COMMUNITY): Payer: Self-pay | Admitting: *Deleted

## 2022-03-10 ENCOUNTER — Encounter (HOSPITAL_COMMUNITY)
Admission: RE | Admit: 2022-03-10 | Discharge: 2022-03-10 | Disposition: A | Discharge status: Home / Self Care | Payer: Medicare Other | Source: Ambulatory Visit | Attending: Internal Medicine | Admitting: Internal Medicine

## 2022-03-10 DIAGNOSIS — M810 Age-related osteoporosis without current pathological fracture: Secondary | ICD-10-CM | POA: Insufficient documentation

## 2022-03-10 DIAGNOSIS — N644 Mastodynia: Secondary | ICD-10-CM | POA: Diagnosis not present

## 2022-03-10 DIAGNOSIS — R928 Other abnormal and inconclusive findings on diagnostic imaging of breast: Secondary | ICD-10-CM | POA: Diagnosis not present

## 2022-03-10 MED ORDER — DENOSUMAB 60 MG/ML ~~LOC~~ SOSY
PREFILLED_SYRINGE | SUBCUTANEOUS | Status: AC
  Filled 2022-03-10: qty 1

## 2022-03-10 MED ORDER — DENOSUMAB 60 MG/ML ~~LOC~~ SOSY
60.0000 mg | PREFILLED_SYRINGE | Freq: Once | SUBCUTANEOUS | Status: AC
  Administered 2022-03-10: 60 mg via SUBCUTANEOUS

## 2022-03-14 ENCOUNTER — Ambulatory Visit: Payer: Medicare Other | Attending: Physician Assistant | Admitting: Physician Assistant

## 2022-03-14 ENCOUNTER — Encounter: Payer: Self-pay | Admitting: Physician Assistant

## 2022-03-14 VITALS — BP 164/72 | HR 62 | Ht 65.0 in | Wt 121.8 lb

## 2022-03-14 DIAGNOSIS — R55 Syncope and collapse: Secondary | ICD-10-CM | POA: Diagnosis not present

## 2022-03-14 DIAGNOSIS — I10 Essential (primary) hypertension: Secondary | ICD-10-CM | POA: Diagnosis not present

## 2022-03-14 MED ORDER — AMLODIPINE BESYLATE 2.5 MG PO TABS: 2.5000 mg | ORAL_TABLET | Freq: Every day | ORAL | 5 refills | Status: DC

## 2022-03-14 NOTE — Patient Instructions (Signed)
Medication Instructions:  Your physician has recommended you make the following change in your medication: START: Amlodipine 2.'5mg'$  daily at bedtime.  *If you need a refill on your cardiac medications before your next appointment, please call your pharmacy*   Lab Work: NONE If you have labs (blood work) drawn today and your tests are completely normal, you will receive your results only by: [place] (if you have MyChart) OR A paper copy in the mail If you have any lab test that is abnormal or we need to change your treatment, we will call you to review the results.   Testing/Procedures: NONE   Follow-Up: At [facility], you and your health needs are our priority.  As part of our continuing mission to provide you with exceptional heart care, we have created designated Provider Care Teams.  These Care Teams include your primary Cardiologist (physician) and Advanced Practice Providers (APPs -  Physician Assistants and Nurse Practitioners) who all work together to provide you with the care you need, when you need it.  We recommend signing up for the patient portal called "MyChart".  Sign up information is provided on this After Visit Summary.  MyChart is used to connect with patients for Virtual Visits (Telemedicine).  Patients are able to view lab/test results, encounter notes, upcoming appointments, etc.  Non-urgent messages can be sent to your provider as well.   To learn more about what you can do with MyChart, go to [email or web address].    Your next appointment:   3 month(s)  The format for your next appointment:   In Person  Provider:   Minus Breeding, MD

## 2022-03-14 NOTE — Progress Notes (Signed)
Cardiology Office Note:    Date:  03/16/2022   ID:  [name], DOB 1931/12/03, MRN [id number]  PCP:  Michelle Keith Providers Cardiologist:  Michelle Breeding, MD     Referring MD: Michelle Infante, MD   Chief Complaint  Patient presents with   Follow-up    Seen for Michelle Keith    History of Present Illness:    Michelle Keith is a 88 y.o. female with a hx of esophageal stricture, GERD, hiatal hernia, IBS, squamous cell carcinoma and a history of syncope.  Myoview in 2016 was negative.  Although there was mention of possible VSD on her primary care doctor's note, however echocardiogram in 2007 did not mention any VSD.  She has a history of palpitation and has been intolerant of calcium channel blocker and beta-blockers.  Holter monitor in 2017 showed no significant arrhythmia.  Recent TSH was normal.  Patient was last seen by Michelle Keith on 10/21/2021.  Since then, patient was seen by Dr. Gardiner Keith for evaluation of chest pain on 01/10/2022, blood pressure was elevated at the time, with ongoing chest discomfort, patient was referred to the emergency room.  High-sensitivity troponin was negative x1.  Basic metabolic panel shows stable renal function and electrolyte. Initial cardiology recommendation is to get a CT to rule out dissection, however family was quite hesitant to obtain a CT with contrast as one of her friends died after receiving contrast and ultimately decided not to proceed with CT.  ED physician offer admission for further work-up however she also declined.  She was ultimately discharged from the emergency room.  I saw the patient on 01/31/2022 at which time her blood pressure remain elevated.  I increased her metoprolol to 37.5 mg twice a day and night and 12.5 mg in the morning.  As for her atypical chest pain, I recommended echocardiogram.  She did not have wish to have a CT with contrast.  She presents today for reassessment of her symptom.   Echocardiogram obtained on 02/06/2022 showed EF 55 to 60%, no regional wall motion abnormality, mild LVH, normal RV, mild MR.  Her blood pressure remains elevated at home.  She is accompanied by daughter to today's visit.  At this time, I think it is time to add a additional blood pressure medication at night.  Unfortunately patient has a lot of intolerances and allergies.  After discussing various options, and discussing the case with Michelle Keith, we recommended starting the patient on the lowest dose of 2.5 of amlodipine at night.  I explained to the patient that amlodipine is a vasodilator.  Her previous syncope was more in the setting of diarrhea, there was one episode of syncope that was associated with orthostatic/autonomic dysfunction.  I do not think this would exclude amlodipine as a potential regimen to lower her blood pressure.  She is willing to give amlodipine a try.  Past Medical History:  Diagnosis Date   Dysautonomia ([place])    Esophageal stricture    GERD (gastroesophageal reflux disease)    Hiatal hernia    IBS (irritable bowel syndrome)    Macular degeneration    Dry OU   Osteopenia    Osteoporosis    Right thyroid nodule    Squamous cell skin cancer    Syncope     Past Surgical History:  Procedure Laterality Date   C-EYE SURGERY PROCEDURE     30 yrs ago   Cataract extraction  CATARACT EXTRACTION Bilateral 2001   Dr. Herbert Deaner   COLONOSCOPY  2009   Also 2002   ESOPHAGEAL DILATION     x 4   ESOPHAGEAL MANOMETRY     ESOPHAGOGASTRODUODENOSCOPY  Multiple   Exploratory laparoscopy     EYE SURGERY Bilateral    Cat Sx - Dr. Dorene Ar FUNDOPLICATION     TOTAL ABDOMINAL HYSTERECTOMY      Current Medications: Current Meds  Medication Sig   amLODipine (NORVASC) 2.5 MG tablet Take 1 tablet (2.5 mg total) by mouth at bedtime.   Calcium-Vitamin D-Vitamin K (VIACTIV PO) Take 1 tablet by mouth 2 (two) times daily.   cholecalciferol (VITAMIN D) 1000 units tablet  Vitamin D   cyanocobalamin 1000 MCG tablet Take 1,000 mcg by mouth daily.   denosumab (PROLIA) 60 MG/ML SOSY injection Inject 60 mg into the skin once. 2 times a year   metoprolol tartrate (LOPRESSOR) 25 MG tablet Take 37.5 mg (1.5 tablets) at night. May take 12.5 mg (half tablet) as needed if blood pressure is greater than 160   Multiple Vitamins-Minerals (PRESERVISION AREDS 2 PO) Take by mouth.   pantoprazole (PROTONIX) 40 MG tablet Take 40 mg by mouth daily as needed (heartburn). Patient states she takes it every other day prn   valACYclovir (VALTREX) 500 MG tablet Take 500 mg by mouth as needed.   zolpidem (AMBIEN) 10 MG tablet Take 2.5 mg by mouth daily as needed for sleep.      Allergies:   Epinephrine, Lidocaine, Aspirin, Atenolol, Clarithromycin, Cortisone, Omeprazole magnesium, Penicillins, Pindolol, Pseudoephedrine-guaifenesin, Sulfonamide derivatives, Tetracycline, Glycol stearate, and Valsartan   Social History   Socioeconomic History   Marital status: Widowed    Spouse name: Not on file   Number of children: 2   Years of education: Not on file   Highest education level: Not on file  Occupational History   Occupation: Retired  Tobacco Use   Smoking status: Never   Smokeless tobacco: Never  Vaping Use   Vaping Use: Never used  Substance and Sexual Activity   Alcohol use: No    Alcohol/week: 0.0 standard drinks of alcohol   Drug use: No   Sexual activity: Not on file  Other Topics Concern   Not on file  Social History Narrative   Lives alone.  Husband in a nursing home after a stroke.        1 son one daughter   Social Determinants of Radio broadcast assistant Strain: Not on file  Food Insecurity: Not on file  Transportation Needs: Not on file  Physical Activity: Not on file  Stress: Not on file  Social Connections: Not on file     Family History: The patient's family history includes Heart failure in her mother; Multiple myeloma in her father.  ROS:    Please see the history of present illness.     All other systems reviewed and are negative.  EKGs/Labs/Other Studies Reviewed:    The following studies were reviewed today:  Echo 02/14/2022  1. Left ventricular ejection fraction, by estimation, is 55 to 60%. The  left ventricle has normal function. The left ventricle has no regional  wall motion abnormalities. There is mild left ventricular hypertrophy.  Left ventricular diastolic parameters  are indeterminate.   2. Right ventricular systolic function is normal. The right ventricular  size is normal. There is normal pulmonary artery systolic pressure. The  estimated right ventricular systolic pressure is 93.8 mmHg.   3.  The mitral valve is degenerative. Mild mitral valve regurgitation. No  evidence of mitral stenosis. Moderate mitral annular calcification.   4. The aortic valve is tricuspid. Aortic valve regurgitation is not  visualized. No aortic stenosis is present.   5. The inferior vena cava is normal in size with greater than 50%  respiratory variability, suggesting right atrial pressure of 3 mmHg.   EKG:  EKG is not ordered today.   Recent Labs: 01/10/2022: BUN 10; Creatinine, Ser 0.80; Hemoglobin 13.1; Platelets 274; Potassium 4.4; Sodium 135  Recent Lipid Panel No results found for: "CHOL", "TRIG", "HDL", "CHOLHDL", "VLDL", "LDLCALC", "LDLDIRECT"   Risk Assessment/Calculations:           Physical Exam:    VS:  BP (!) 164/72   Pulse 62   Ht 5' 5" (1.651 m)   Wt 121 lb 12.8 oz (55.2 kg)   SpO2 99%   BMI 20.27 kg/m        Wt Readings from Last 3 Encounters:  03/14/22 121 lb 12.8 oz (55.2 kg)  01/31/22 124 lb (56.2 kg)  01/10/22 121 lb 7 oz (55.1 kg)     GEN:  Well nourished, well developed in no acute distress HEENT: Normal NECK: No JVD; No carotid bruits LYMPHATICS: No lymphadenopathy CARDIAC: RRR, no murmurs, rubs, gallops RESPIRATORY:  Clear to auscultation without rales, wheezing or rhonchi   ABDOMEN: Soft, non-tender, non-distended MUSCULOSKELETAL:  No edema; No deformity  SKIN: Warm and dry NEUROLOGIC:  Alert and oriented x 3 PSYCHIATRIC:  Normal affect   ASSESSMENT:    1. Uncontrolled hypertension   2. Syncope, unspecified syncope type    PLAN:    In order of problems listed above:   Uncontrolled hypertension: Blood pressure remain uncontrolled despite 20 increased metoprolol before.  We discussed various options.  Patient has multiple allergies.  She has never tried amlodipine, I recommended start on 2.5 mg of amlodipine every night.  I discussed the case with Michelle Keith at the request of the patient and her daughter who is also agreeable with the decision.  She is willing to give it a try  History of syncope: She has multiple syncope in the past, majority of which occurred during diarrhea and dehydration.  There was only one episode that occurred in the past that was related to orthostatic/autonomic dysfunction, this was based on Dr. Olin Pia assessment.           Medication Adjustments/Labs and Tests Ordered: Current medicines are reviewed at length with the patient today.  Concerns regarding medicines are outlined above.  No orders of the defined types were placed in this encounter.  Meds ordered this encounter  Medications   amLODipine (NORVASC) 2.5 MG tablet    Sig: Take 1 tablet (2.5 mg total) by mouth at bedtime.    Dispense:  30 tablet    Refill:  5    Patient Instructions  Medication Instructions:  Your physician has recommended you make the following change in your medication: START: Amlodipine 2.77m daily at bedtime.  *If you need a refill on your cardiac medications before your next appointment, please call your pharmacy*   Lab Work: NONE If you have labs (blood work) drawn today and your tests are completely normal, you will receive your results only by: [place](if you have MyChart) OR A paper copy in the mail If you have any  lab test that is abnormal or we need to change your treatment, we will call you to review the  results.   Testing/Procedures: NONE   Follow-Up: At [facility], you and your health needs are our priority.  As part of our continuing mission to provide you with exceptional heart care, we have created designated Provider Care Teams.  These Care Teams include your primary Cardiologist (physician) and Advanced Practice Providers (APPs -  Physician Assistants and Nurse Practitioners) who all work together to provide you with the care you need, when you need it.  We recommend signing up for the patient portal called "MyChart".  Sign up information is provided on this After Visit Summary.  MyChart is used to connect with patients for Virtual Visits (Telemedicine).  Patients are able to view lab/test results, encounter notes, upcoming appointments, etc.  Non-urgent messages can be sent to your provider as well.   To learn more about what you can do with MyChart, go to [email or web address].    Your next appointment:   3 month(s)  The format for your next appointment:   In Person  Provider:   Minus Breeding, MD     Signed, [name], [place]  03/16/2022 11:38 PM    [place]

## 2022-03-15 ENCOUNTER — Telehealth: Payer: Self-pay | Admitting: Cardiology

## 2022-03-15 NOTE — Telephone Encounter (Signed)
Spoke to patient she stated she started taking Amlodipine 2.5 mg .She took her first dose last night.Stated she woke up around 1:00 am her chest just did not feel right.No chest pain.No fast heart beat.B/P 192/107 pulse 95.She had stiffness in back of neck.She is concerned about neck stiffness.She is worried about having a stroke.Her husband had neck stiffness before he had a stroke.Her B/P at present 162/90 pulse 71.No neck stiffness at present.Her chest feels ok. Spoke to [employer] PA he advised to try taking Amlodipine 2.5 mg again tonight and if she cannot take to stop. Stated she would like to ask Dr.Hochrein his advice.Advised Dr.Hochrein is not in office this morning.I will send message to him.

## 2022-03-15 NOTE — Telephone Encounter (Signed)
Pt c/o medication issue:  1. Name of Medication:   2. How are you currently taking this medication (dosage and times per day)? amLODipine (NORVASC) 2.5 MG tablet  3. Are you having a reaction (difficulty breathing--STAT)? Yes  4. What is your medication issue? 192/107 HR 95 when pt took readings last night because she felt like her heart wasn't feeling normal. She states that the change of medication seems to be causing the issue and does not want to take it anymore. Requesting call back to discuss this.

## 2022-03-15 NOTE — Telephone Encounter (Signed)
Spoke to patient Dr.Hochrein's advice given.Appointment scheduled with Dr.Hochrein 9/29 at 9:00 am.

## 2022-03-16 ENCOUNTER — Encounter: Payer: Self-pay | Admitting: Physician Assistant

## 2022-03-17 NOTE — Progress Notes (Signed)
Cardiology Office Note   Date:  03/18/2022   ID:  Michelle Keith, Michelle Keith April 09, 1932, MRN [id number]  PCP:  [name], MD Cardiologist:   Minus Breeding, MD   Chief Complaint  Patient presents with   Nausea      History of Present Illness: Michelle Keith is a 88 y.o. female who presents for follow up of chest pain. She did have a history of syncope. She's had a workup that has included a negative stress perfusion study in 2016. Of note there is mention of a possible VSD in her primary care notes. However, I reviewed the echo results in 2007 and don't see mention of this. She has  had palpitations and has been intolerant of calcium channel blockers and beta blockers.     She wore a Holter in  2017.  There were no significant arrhythmias.  She has been having labile BPs.    She was seen on 01/31/2022 and her blood pressure was still elevated.  Her beta-blocker was increased.  Echocardiogram demonstrated well-preserved ejection fraction with no significant valvular abnormalities.  She was having elevated blood pressures again last week when she saw  Michelle Keith PAC.  He started a very low-dose of nighttime Norvasc.  However, she called having had symptoms with this.  Of note she was in the hospital in July with some atypical chest pain and there was conversation of a work-up for dissection but she did not want a CT.  She was added to my schedule today.  She has she just did not tolerate the Norvasc.  She has been taking an extra metoprolol most days.  Her blood pressures have been 174Y to 814G systolic.  They been rarely in the 130s.  Past Medical History:  Diagnosis Date   Dysautonomia ([place])    Esophageal stricture    GERD (gastroesophageal reflux disease)    Hiatal hernia    IBS (irritable bowel syndrome)    Macular degeneration    Dry OU   Osteopenia    Osteoporosis    Right thyroid nodule    Squamous cell skin cancer    Syncope     Past Surgical History:  Procedure Laterality  Date   C-EYE SURGERY PROCEDURE     30 yrs ago   Cataract extraction     CATARACT EXTRACTION Bilateral 2001   Dr. Herbert Deaner   COLONOSCOPY  2009   Also 2002   ESOPHAGEAL DILATION     x 4   ESOPHAGEAL MANOMETRY     ESOPHAGOGASTRODUODENOSCOPY  Multiple   Exploratory laparoscopy     EYE SURGERY Bilateral    Cat Sx - Dr. Dorene Ar FUNDOPLICATION     TOTAL ABDOMINAL HYSTERECTOMY       Current Outpatient Medications  Medication Sig Dispense Refill   Calcium-Vitamin D-Vitamin K (VIACTIV PO) Take 1 tablet by mouth 2 (two) times daily.     cholecalciferol (VITAMIN D) 1000 units tablet Vitamin D     cyanocobalamin 1000 MCG tablet Take 1,000 mcg by mouth daily.     denosumab (PROLIA) 60 MG/ML SOSY injection Inject 60 mg into the skin once. 2 times a year     metoprolol tartrate (LOPRESSOR) 25 MG tablet Take 37.5 mg (1.5 tablets) at night. May take 12.5 mg (half tablet) as needed if blood pressure is greater than 160     Multiple Vitamins-Minerals (PRESERVISION AREDS 2 PO) Take by mouth.     pantoprazole (PROTONIX) 40 MG tablet  Take 40 mg by mouth daily as needed (heartburn). Patient states she takes it every other day prn     valACYclovir (VALTREX) 500 MG tablet Take 500 mg by mouth as needed.     zolpidem (AMBIEN) 10 MG tablet Take 2.5 mg by mouth daily as needed for sleep.      No current facility-administered medications for this visit.    Allergies:   Epinephrine, Lidocaine, Aspirin, Atenolol, Clarithromycin, Cortisone, Norvasc [amlodipine], Omeprazole magnesium, Penicillins, Pindolol, Pseudoephedrine-guaifenesin, Sulfonamide derivatives, Tetracycline, Glycol stearate, and Valsartan    ROS:  Please see the history of present illness.   Otherwise, review of systems are positive for none.  All other systems are reviewed and negative.    PHYSICAL EXAM: VS:  BP (!) 156/84   Pulse 90   Ht '5\' 5"'$  (1.651 m)   Wt 121 lb 6.4 oz (55.1 kg)   SpO2 100%   BMI 20.20 kg/m  , BMI Body mass  index is 20.2 kg/m.  GENERAL:  Well appearing NECK:  No jugular venous distention, waveform within normal limits, carotid upstroke brisk and symmetric, no bruits, no thyromegaly LUNGS:  Clear to auscultation bilaterally CHEST:  Unremarkable HEART:  PMI not displaced or sustained,S1 and S2 within normal limits, no S3, no S4, no clicks, no rubs, no murmurs ABD:  Flat, positive bowel sounds normal in frequency in pitch, no bruits, no rebound, no guarding, no midline pulsatile mass, no hepatomegaly, no splenomegaly EXT:  2 plus pulses throughout, no edema, no cyanosis no clubbing   EKG:  EKG is   ordered today. Sinus rhythm, rate 59, axis within normal limits, intervals within normal limits, no acute ST-T wave changes. PACs.   Recent Labs: 01/10/2022: BUN 10; Creatinine, Ser 0.80; Hemoglobin 13.1; Platelets 274; Potassium 4.4; Sodium 135    Lipid Panel No results found for: "CHOL", "TRIG", "HDL", "CHOLHDL", "VLDL", "LDLCALC", "LDLDIRECT"    Wt Readings from Last 3 Encounters:  03/18/22 121 lb 6.4 oz (55.1 kg)  03/14/22 121 lb 12.8 oz (55.2 kg)  01/31/22 124 lb (56.2 kg)    Lab Results  Component Value Date   TSH 3.05 01/13/2016     Other studies Reviewed: Additional studies/ records that were reviewed today include: Labs Review of the above records demonstrates:    See elsewhere   ASSESSMENT AND PLAN:   PALPITATIONS: These have not been particularly problematic.  No change in therapy.  HTN:   Her blood pressure is labile and she is sensitive to medications.  We agreed that she can take an extra 12 and half milligrams of metoprolol every morning and then as needed through the afternoon.  We will slowly adjust her beta-blocker only since it is the one medicine she seems to tolerate.   Current medicines are reviewed at length with the patient today.  The patient does not have concerns regarding medicines.  The following changes have been made: As above  Labs/ tests ordered  today include:    None  No orders of the defined types were placed in this encounter.     Disposition:   FU with me in 3 months   Signed, Minus Breeding, MD  03/18/2022 10:03 AM    [place]

## 2022-03-18 ENCOUNTER — Encounter (HOSPITAL_COMMUNITY): Payer: Medicare Other

## 2022-03-18 ENCOUNTER — Encounter: Payer: Self-pay | Admitting: Cardiology

## 2022-03-18 ENCOUNTER — Ambulatory Visit: Payer: Medicare Other | Attending: Cardiology | Admitting: Cardiology

## 2022-03-18 VITALS — BP 156/84 | HR 90 | Ht 65.0 in | Wt 121.4 lb

## 2022-03-18 DIAGNOSIS — R002 Palpitations: Secondary | ICD-10-CM

## 2022-03-18 DIAGNOSIS — I10 Essential (primary) hypertension: Secondary | ICD-10-CM

## 2022-03-18 NOTE — Patient Instructions (Signed)
Medication Instructions:  The current medical regimen is effective;  continue present plan and medications.  *If you need a refill on your cardiac medications before your next appointment, please call your pharmacy*   Follow-Up: At [facility], you and your health needs are our priority.  As part of our continuing mission to provide you with exceptional heart care, we have created designated Provider Care Teams.  These Care Teams include your primary Cardiologist (physician) and Advanced Practice Providers (APPs -  Physician Assistants and Nurse Practitioners) who all work together to provide you with the care you need, when you need it.  We recommend signing up for the patient portal called "MyChart".  Sign up information is provided on this After Visit Summary.  MyChart is used to connect with patients for Virtual Visits (Telemedicine).  Patients are able to view lab/test results, encounter notes, upcoming appointments, etc.  Non-urgent messages can be sent to your provider as well.   To learn more about what you can do with MyChart, go to [email or web address].    Your next appointment:   3 month(s)  The format for your next appointment:   In Person  Provider:   Minus Breeding, MD

## 2022-03-21 ENCOUNTER — Telehealth: Payer: Self-pay | Admitting: Cardiology

## 2022-03-21 NOTE — Telephone Encounter (Signed)
Patient stated her SBP is int the 170s-180s. This AM 181/105. After walking for about 45 minutes, BP 144/80. She has "slight" frontal headache. Denies chest pain, sob. She is taking metoprolol tartrate 37.37m each night, 12.5 mg each morning, and 12.581mas needed in the afternoons. She is asking if she could take 2558mach morning. She stated she is tolerating the medication. Please advise on morning dose of met tartrate.

## 2022-03-21 NOTE — Telephone Encounter (Signed)
  Pt c/o BP issue: STAT if pt c/o blurred vision, one-sided weakness or slurred speech  1. What are your last 5 BP readings? 181/105  2. Are you having any other symptoms (ex. Dizziness, headache, blurred vision, passed out)? A little headache   3. What is your BP issue? Pt said, She was told by Dr.Hochrein she can take an extra hafl tablet of her metoprolol if her BP is elevated. She said she did take extra half pill yesterday and today but her BP is still elevated. Her BP this morning is 181/105 and feeling a little headache. She wanted to know if she can take a whole tablet instead or any recommendation from Dr. Percival Spanish

## 2022-03-22 ENCOUNTER — Telehealth: Payer: Self-pay | Admitting: Cardiology

## 2022-03-22 MED ORDER — METOPROLOL TARTRATE 25 MG PO TABS: 12.5000 mg | ORAL_TABLET | Freq: Two times a day (BID) | ORAL | 3 refills | Status: DC

## 2022-03-22 MED ORDER — METOPROLOL TARTRATE 25 MG PO TABS: ORAL_TABLET | ORAL | 3 refills | Status: DC

## 2022-03-22 NOTE — Telephone Encounter (Signed)
Patient called to report her 4 pm BP was 134/77. Prescription refills sent to her preferred pharmacy.

## 2022-03-22 NOTE — Telephone Encounter (Signed)
Informed patient she can take metoprolol tartrate '25mg'$  each morning. At 6 am, she took 12.5 mg. BP 159/89. Recommended she take the other half tablet now. She will call clinic between 3-4 pm to advise of BP before her afternoon prn dose.

## 2022-03-22 NOTE — Telephone Encounter (Signed)
*  STAT* If patient is at the pharmacy, call can be transferred to refill team.   1. Which medications need to be refilled? (please list name of each medication and dose if known) metoprolol tartrate (LOPRESSOR) 25 MG tablet  2. Which pharmacy/location (including street and city if local pharmacy) is medication to be sent to? [facility] [place], [place]  3. Do they need a 30 day or 90 day supply? [place]

## 2022-03-22 NOTE — Addendum Note (Signed)
Addended by: Betha Loa F on: 03/22/2022 04:47 PM   Modules accepted: Orders

## 2022-03-22 NOTE — Telephone Encounter (Signed)
Pt is calling back to give bp readings. Transferred to Judson Roch, Therapist, sports.

## 2022-03-28 ENCOUNTER — Telehealth: Payer: Self-pay | Admitting: Cardiology

## 2022-03-28 MED ORDER — METOPROLOL TARTRATE 25 MG PO TABS: ORAL_TABLET | ORAL | 3 refills | Status: DC

## 2022-03-28 NOTE — Telephone Encounter (Signed)
Spoke to patient Dr.Hochrein's advice given.She will take Metoprolol 25 mg 1&1/2 tablets twice a day.She can take a extra 25 mg if systolic 037 or greater.Advised to continue to monitor and call back if B/P elevated.

## 2022-03-28 NOTE — Telephone Encounter (Signed)
Spoke to patient she stated her B/P is still elevated.Reading listed below.Pulse ranges 57 to 78.She increased Metoprolol 25 mg to 1 tablet in am and 1&1/2 tablets in pm 1 week ago.She was told to take a extra Metoprolol if systolic B/P greater that 160.She wanted to know if she can take 25 mg 1&1/2 tablets twice a day.I will send message to Dr.Hochrein for advice.

## 2022-03-28 NOTE — Telephone Encounter (Signed)
Pt c/o BP issue: STAT if pt c/o blurred vision, one-sided weakness or slurred speech  1. What are your last 5 BP readings?   10:00 am  172/91 8:05 am  178/95 6:40 am   180/99 6:15 pm   161/90 5:49 pm    156/84  2. Are you having any other symptoms (ex. Dizziness, headache, blurred vision, passed out)?   Slight headache and diarrhea  3. What is your BP issue?   Patient stated she had medication (metoprolol tartrate (LOPRESSOR) 25 MG tablet) increased on medication but it has not helped, her BP is still reading high.

## 2022-03-29 DIAGNOSIS — Z23 Encounter for immunization: Secondary | ICD-10-CM | POA: Diagnosis not present

## 2022-03-29 DIAGNOSIS — E559 Vitamin D deficiency, unspecified: Secondary | ICD-10-CM | POA: Diagnosis not present

## 2022-03-29 DIAGNOSIS — F329 Major depressive disorder, single episode, unspecified: Secondary | ICD-10-CM | POA: Diagnosis not present

## 2022-03-29 DIAGNOSIS — R82998 Other abnormal findings in urine: Secondary | ICD-10-CM | POA: Diagnosis not present

## 2022-03-29 DIAGNOSIS — I10 Essential (primary) hypertension: Secondary | ICD-10-CM | POA: Diagnosis not present

## 2022-03-29 DIAGNOSIS — F419 Anxiety disorder, unspecified: Secondary | ICD-10-CM | POA: Diagnosis not present

## 2022-03-29 DIAGNOSIS — E538 Deficiency of other specified B group vitamins: Secondary | ICD-10-CM | POA: Diagnosis not present

## 2022-03-29 DIAGNOSIS — R5383 Other fatigue: Secondary | ICD-10-CM | POA: Diagnosis not present

## 2022-03-29 DIAGNOSIS — Z0000 Encounter for general adult medical examination without abnormal findings: Secondary | ICD-10-CM | POA: Diagnosis not present

## 2022-03-29 DIAGNOSIS — Z1331 Encounter for screening for depression: Secondary | ICD-10-CM | POA: Diagnosis not present

## 2022-03-29 DIAGNOSIS — Z1339 Encounter for screening examination for other mental health and behavioral disorders: Secondary | ICD-10-CM | POA: Diagnosis not present

## 2022-04-04 ENCOUNTER — Telehealth: Payer: Self-pay | Admitting: Cardiology

## 2022-04-04 NOTE — Telephone Encounter (Signed)
Returned call to patient left message on personal voice mail to call back. 

## 2022-04-04 NOTE — Telephone Encounter (Signed)
Received a call from patient.She was calling to report her B/P readings 162/97,166/91,144/77,158/91,154/89,154/84, 155/89.Pulse 60 to 70's.Stated she feels ok.No complaints.Advised I will send message to Dr.Hochrein to review.

## 2022-04-04 NOTE — Telephone Encounter (Signed)
Patient states she going in to report her bp and she wanted to speak to the nurse because they know her situation. Please advise

## 2022-04-04 NOTE — Telephone Encounter (Signed)
Spoke to patient Dr.Hochrein's advice given.

## 2022-04-05 ENCOUNTER — Ambulatory Visit
Admission: RE | Admit: 2022-04-05 | Discharge: 2022-04-05 | Disposition: A | Discharge status: Home / Self Care | Payer: Medicare Other | Source: Ambulatory Visit | Attending: Gastroenterology | Admitting: Gastroenterology

## 2022-04-05 DIAGNOSIS — R188 Other ascites: Secondary | ICD-10-CM | POA: Diagnosis not present

## 2022-04-05 DIAGNOSIS — R197 Diarrhea, unspecified: Secondary | ICD-10-CM | POA: Diagnosis not present

## 2022-04-05 DIAGNOSIS — R194 Change in bowel habit: Secondary | ICD-10-CM

## 2022-04-05 DIAGNOSIS — R109 Unspecified abdominal pain: Secondary | ICD-10-CM

## 2022-04-05 DIAGNOSIS — K551 Chronic vascular disorders of intestine: Secondary | ICD-10-CM | POA: Diagnosis not present

## 2022-04-05 DIAGNOSIS — K5730 Diverticulosis of large intestine without perforation or abscess without bleeding: Secondary | ICD-10-CM | POA: Diagnosis not present

## 2022-04-05 MED ORDER — IOPAMIDOL (ISOVUE-300) INJECTION 61%
100.0000 mL | Freq: Once | INTRAVENOUS | Status: AC | PRN
  Administered 2022-04-05: 100 mL via INTRAVENOUS

## 2022-04-07 DIAGNOSIS — H6123 Impacted cerumen, bilateral: Secondary | ICD-10-CM | POA: Diagnosis not present

## 2022-04-13 ENCOUNTER — Other Ambulatory Visit: Payer: Self-pay | Admitting: Gastroenterology

## 2022-04-13 DIAGNOSIS — R932 Abnormal findings on diagnostic imaging of liver and biliary tract: Secondary | ICD-10-CM

## 2022-04-18 ENCOUNTER — Telehealth: Payer: Self-pay | Admitting: Cardiology

## 2022-04-18 NOTE — Telephone Encounter (Signed)
Returned the call to the patient. She stated that she is taking her Metoprolol Tartrate 37.5 mg bid. She stated that her blood pressure is always highest in the morning and the pills are too hard for her to split.  She would like to take 25 mg in the morning and 50 mg in the evening. She has not had to take any extra for blood pressure over 160.

## 2022-04-18 NOTE — Telephone Encounter (Signed)
Pt c/o medication issue:  1. Name of Medication: metoprolol tartrate (LOPRESSOR) 25 MG tablet  2. How are you currently taking this medication (dosage and times per day)?   3. Are you having a reaction (difficulty breathing--STAT)?   4. What is your medication issue? Pt has a question about taking this medication

## 2022-04-19 DIAGNOSIS — Z1152 Encounter for screening for COVID-19: Secondary | ICD-10-CM | POA: Diagnosis not present

## 2022-04-19 DIAGNOSIS — M810 Age-related osteoporosis without current pathological fracture: Secondary | ICD-10-CM | POA: Diagnosis not present

## 2022-04-19 DIAGNOSIS — H9201 Otalgia, right ear: Secondary | ICD-10-CM | POA: Diagnosis not present

## 2022-04-19 DIAGNOSIS — R5383 Other fatigue: Secondary | ICD-10-CM | POA: Diagnosis not present

## 2022-04-19 MED ORDER — METOPROLOL TARTRATE 25 MG PO TABS: ORAL_TABLET | ORAL | 3 refills | Status: DC

## 2022-04-19 NOTE — Telephone Encounter (Signed)
Patient has been made aware:  Minus Breeding, MD  You 16 hours ago (5:34 PM)    Yes.  Sound fine to try this.  Thanks.  Maylon Cos

## 2022-04-20 ENCOUNTER — Encounter (INDEPENDENT_AMBULATORY_CARE_PROVIDER_SITE_OTHER): Payer: Medicare Other | Admitting: Ophthalmology

## 2022-04-24 IMAGING — CR DG CERVICAL SPINE COMPLETE 4+V
5 series · 5 of 5 positions shown · non-contrast
Comparison: None.

CLINICAL DATA: Neck pain for 4 days

EXAM:
CERVICAL SPINE - COMPLETE 4+ VIEW

[w cervical spine lat]
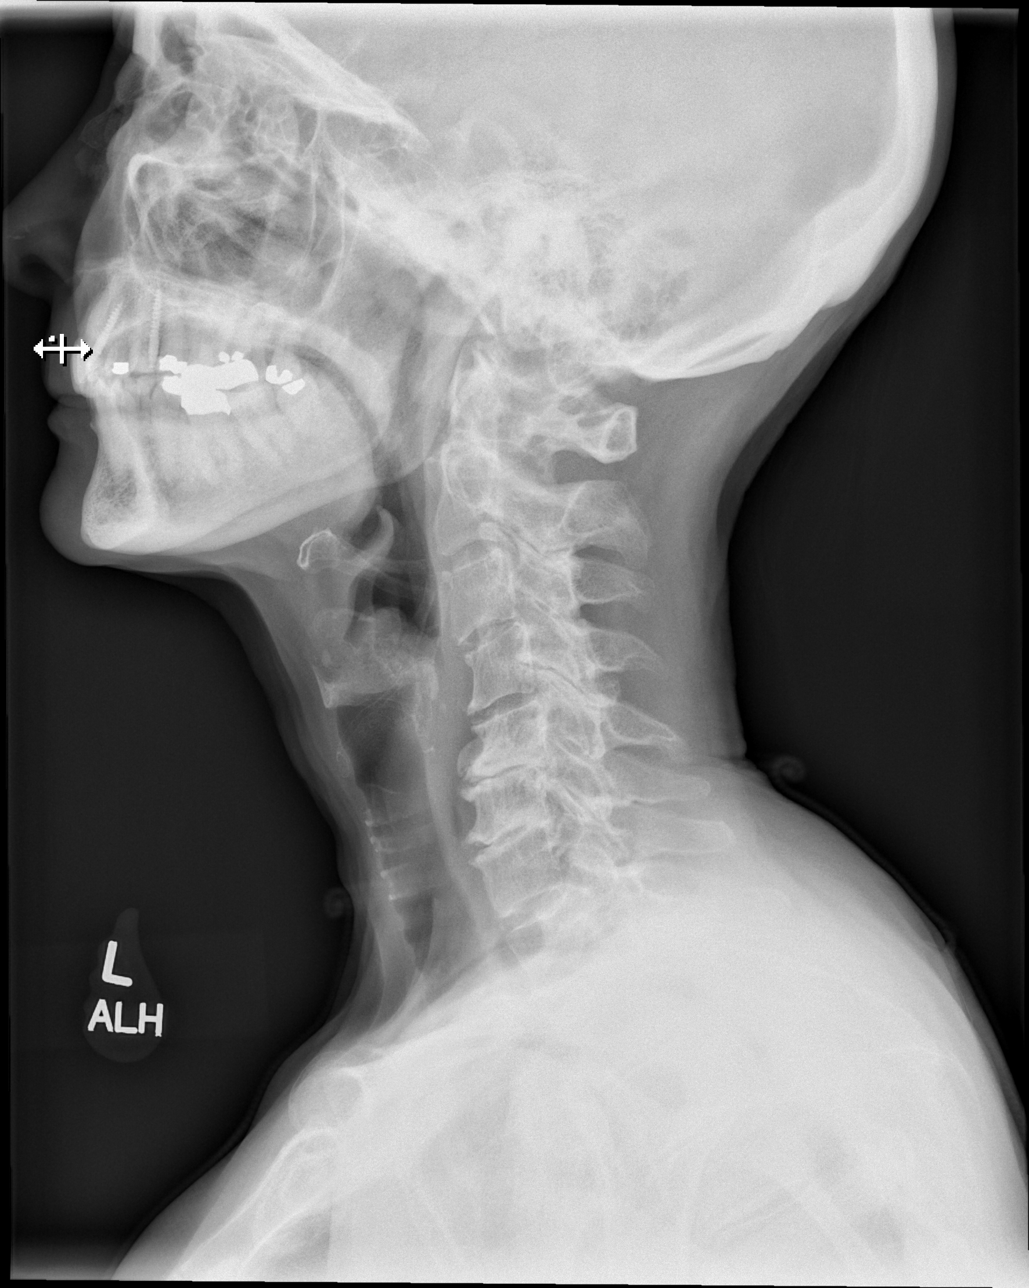

[w cervical spine ap_obl (1 of 2)]
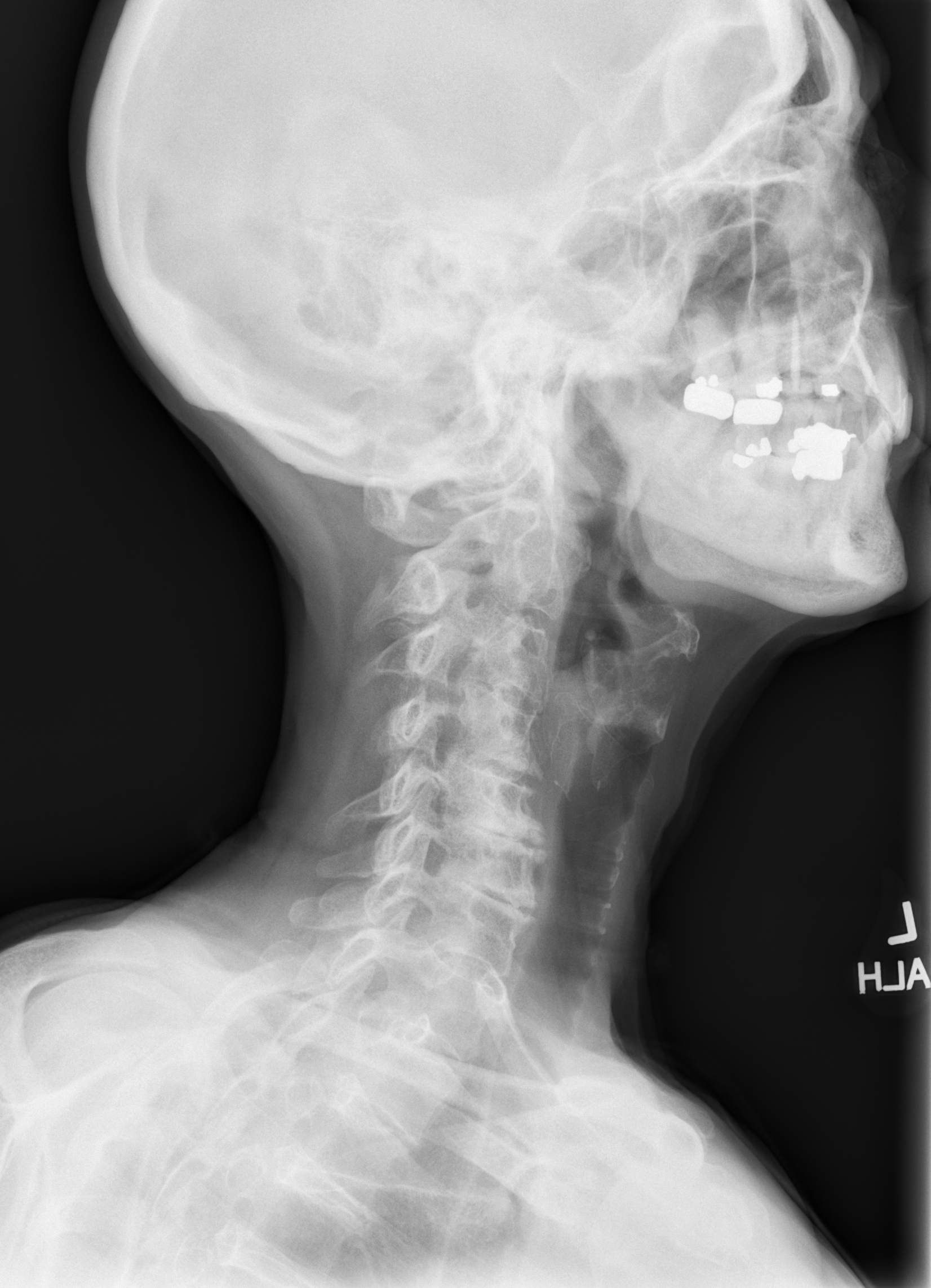

[w cervical spine ap_obl (2 of 2)]
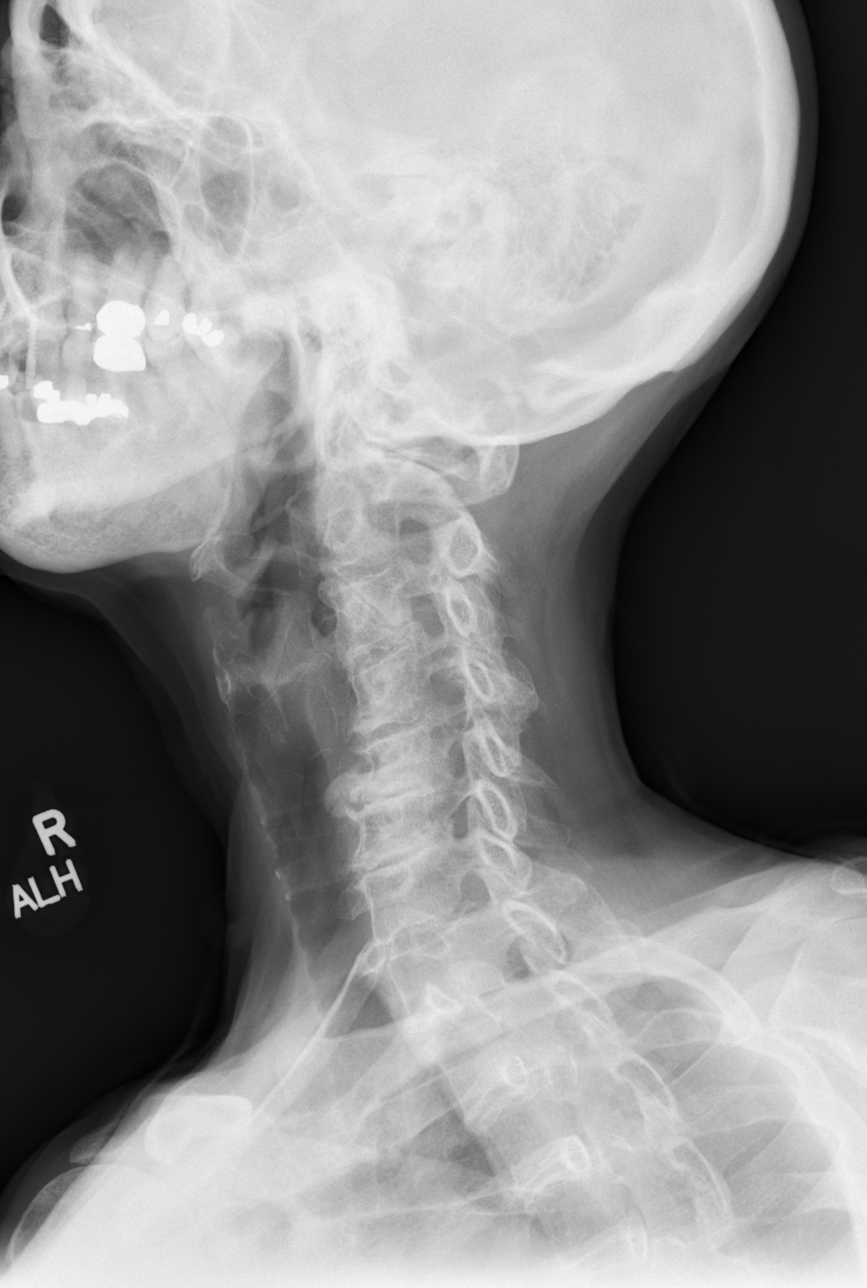

[w cervical spine ap]
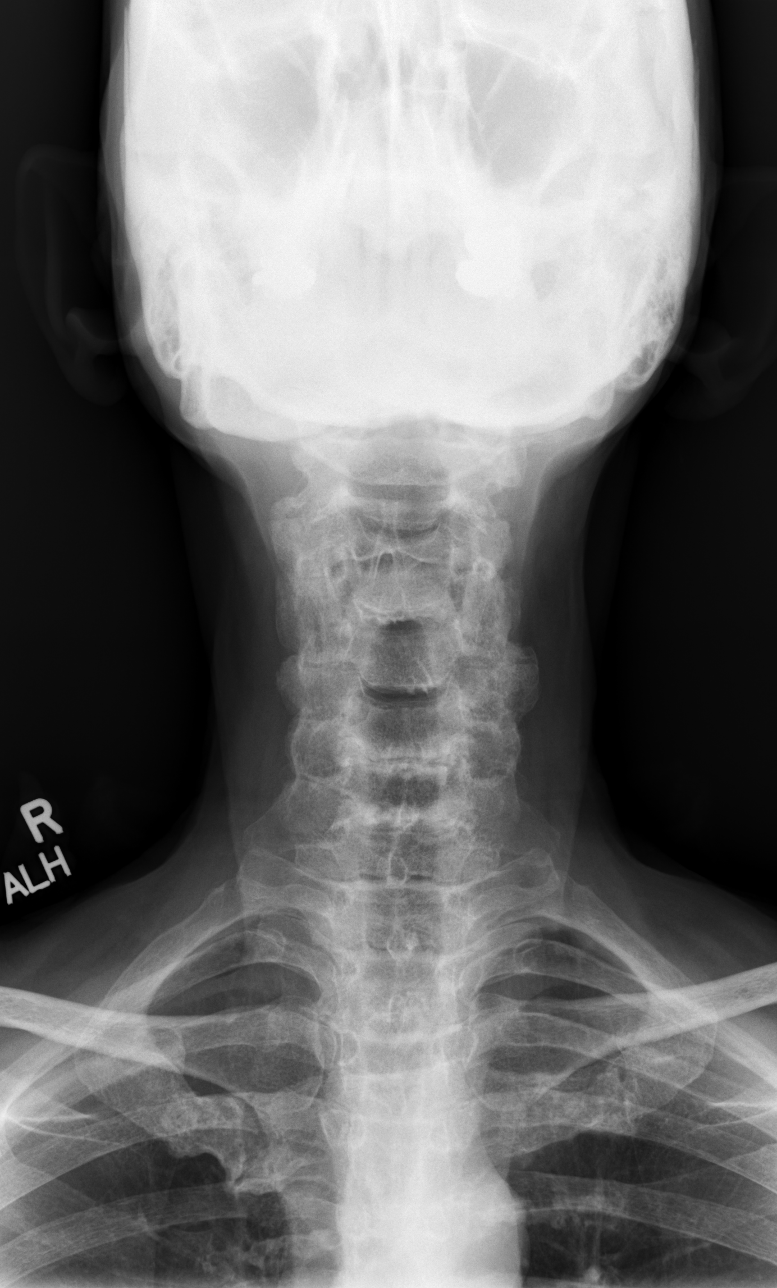

[w cervical spine odontoid]
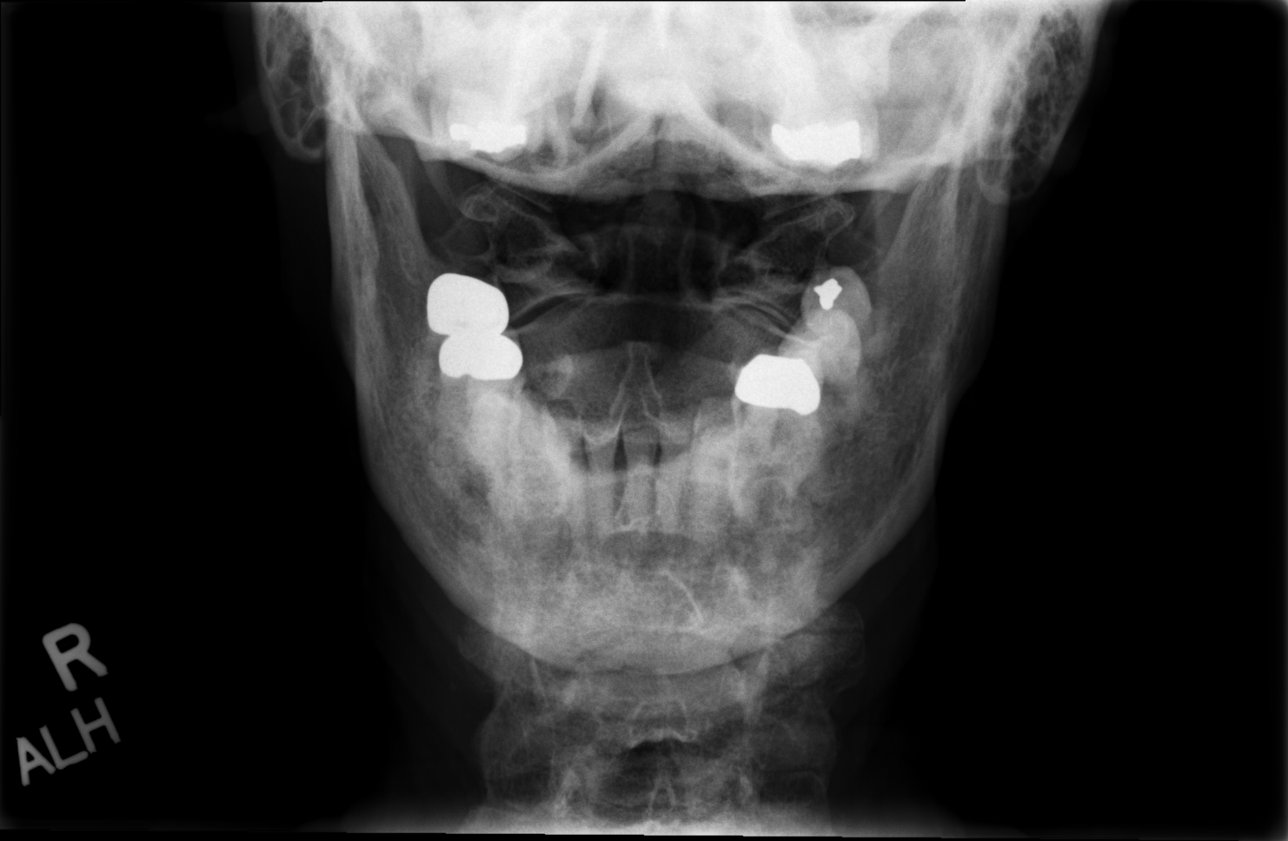

[5 of 5 positions shown; findings below may reference images not displayed]

FINDINGS: There is no evidence of cervical spine fracture or prevertebral soft
tissue swelling. Minimal anterolisthesis C3 on C4. Trace
retrolisthesis C5 on C6. Multilevel intervertebral disc height loss
most severe at C5-6 and C6-7. Advanced multilevel facet and
uncovertebral arthropathy resulting in bony foraminal narrowing most
pronounced at the C5-6 level on the right.
IMPRESSION: 1. No acute fracture.
2. Advanced multilevel degenerative changes, most pronounced at C5-6
and C6-7.

## 2022-05-04 ENCOUNTER — Ambulatory Visit
Admission: RE | Admit: 2022-05-04 | Discharge: 2022-05-04 | Disposition: A | Discharge status: Home / Self Care | Payer: Medicare Other | Source: Ambulatory Visit | Attending: Gastroenterology | Admitting: Gastroenterology

## 2022-05-04 DIAGNOSIS — R932 Abnormal findings on diagnostic imaging of liver and biliary tract: Secondary | ICD-10-CM

## 2022-05-04 DIAGNOSIS — K7689 Other specified diseases of liver: Secondary | ICD-10-CM | POA: Diagnosis not present

## 2022-05-04 MED ORDER — GADOPICLENOL 0.5 MMOL/ML IV SOLN
6.0000 mL | Freq: Once | INTRAVENOUS | Status: AC | PRN
  Administered 2022-05-04: 6 mL via INTRAVENOUS

## 2022-05-31 DIAGNOSIS — Z79899 Other long term (current) drug therapy: Secondary | ICD-10-CM | POA: Diagnosis not present

## 2022-05-31 DIAGNOSIS — I10 Essential (primary) hypertension: Secondary | ICD-10-CM | POA: Diagnosis not present

## 2022-06-28 NOTE — Progress Notes (Unsigned)
Cardiology Office Note   Date:  06/28/2022   ID:  Michelle Keith, DOB July 30, 1931, MRN [id number]  PCP:  Michelle Infante, MD Cardiologist:   Michelle Breeding, MD   No chief complaint on file.     History of Present Illness: Michelle Keith is a 89 y.o. female who presents for follow up of chest pain. She did have a history of syncope. She's had a workup that has included a negative stress perfusion study in 2016. Of note there is mention of a possible VSD in her primary care notes. However, I reviewed the echo results in 2007 and don't see mention of this. She has  had palpitations and has been intolerant of calcium channel blockers and beta blockers.     She wore a Holter in  2017.  There were no significant arrhythmias.  She has been having labile BPs.    Since I last saw her ***  ***  She was seen on 01/31/2022 and her blood pressure was still elevated.  Her beta-blocker was increased.  Echocardiogram demonstrated well-preserved ejection fraction with no significant valvular abnormalities.  She was having elevated blood pressures again last week when she saw  [name] PAC.  He started a very low-dose of nighttime Norvasc.  However, she called having had symptoms with this.  Of note she was in the hospital in July with some atypical chest pain and there was conversation of a work-up for dissection but she did not want a CT.  She was added to my schedule today.  She has she just did not tolerate the Norvasc.  She has been taking an extra metoprolol most days.  Her blood pressures have been 841L to 244W systolic.  They been rarely in the 130s.  Past Medical History:  Diagnosis Date   Dysautonomia ([place])    Esophageal stricture    GERD (gastroesophageal reflux disease)    Hiatal hernia    IBS (irritable bowel syndrome)    Macular degeneration    Dry OU   Osteopenia    Osteoporosis    Right thyroid nodule    Squamous cell skin cancer    Syncope     Past Surgical History:  Procedure  Laterality Date   C-EYE SURGERY PROCEDURE     30 yrs ago   Cataract extraction     CATARACT EXTRACTION Bilateral 2001   Dr. Herbert Deaner   COLONOSCOPY  2009   Also 2002   ESOPHAGEAL DILATION     x 4   ESOPHAGEAL MANOMETRY     ESOPHAGOGASTRODUODENOSCOPY  Multiple   Exploratory laparoscopy     EYE SURGERY Bilateral    Cat Sx - Dr. Dorene Ar FUNDOPLICATION     TOTAL ABDOMINAL HYSTERECTOMY       Current Outpatient Medications  Medication Sig Dispense Refill   Calcium-Vitamin D-Vitamin K (VIACTIV PO) Take 1 tablet by mouth 2 (two) times daily.     cholecalciferol (VITAMIN D) 1000 units tablet Vitamin D     cyanocobalamin 1000 MCG tablet Take 1,000 mcg by mouth daily.     denosumab (PROLIA) 60 MG/ML SOSY injection Inject 60 mg into the skin once. 2 times a year     metoprolol tartrate (LOPRESSOR) 25 MG tablet Take 25 mg in the AM and 50 mg in the PM. May take a extra 25 mg 1 tablet if Systolic B/P 102 or greater 360 tablet 3   Multiple Vitamins-Minerals (PRESERVISION AREDS 2 PO) Take by mouth.  pantoprazole (PROTONIX) 40 MG tablet Take 40 mg by mouth daily as needed (heartburn). Patient states she takes it every other day prn     valACYclovir (VALTREX) 500 MG tablet Take 500 mg by mouth as needed.     zolpidem (AMBIEN) 10 MG tablet Take 2.5 mg by mouth daily as needed for sleep.      No current facility-administered medications for this visit.    Allergies:   Epinephrine, Lidocaine, Aspirin, Atenolol, Clarithromycin, Cortisone, Norvasc [amlodipine], Omeprazole magnesium, Penicillins, Pindolol, Pseudoephedrine-guaifenesin, Sulfonamide derivatives, Tetracycline, Glycol stearate, and Valsartan    ROS:  Please see the history of present illness.   Otherwise, review of systems are positive for ***.  All other systems are reviewed and negative.    PHYSICAL EXAM: VS:  There were no vitals taken for this visit. , BMI There is no height or weight on file to calculate BMI.  GENERAL:   Well appearing NECK:  No jugular venous distention, waveform within normal limits, carotid upstroke brisk and symmetric, no bruits, no thyromegaly LUNGS:  Clear to auscultation bilaterally CHEST:  Unremarkable HEART:  PMI not displaced or sustained,S1 and S2 within normal limits, no S3, no S4, no clicks, no rubs, *** murmurs ABD:  Flat, positive bowel sounds normal in frequency in pitch, no bruits, no rebound, no guarding, no midline pulsatile mass, no hepatomegaly, no splenomegaly EXT:  2 plus pulses throughout, no edema, no cyanosis no clubbing    ***GENERAL:  Well appearing NECK:  No jugular venous distention, waveform within normal limits, carotid upstroke brisk and symmetric, no bruits, no thyromegaly LUNGS:  Clear to auscultation bilaterally CHEST:  Unremarkable HEART:  PMI not displaced or sustained,S1 and S2 within normal limits, no S3, no S4, no clicks, no rubs, no murmurs ABD:  Flat, positive bowel sounds normal in frequency in pitch, no bruits, no rebound, no guarding, no midline pulsatile mass, no hepatomegaly, no splenomegaly EXT:  2 plus pulses throughout, no edema, no cyanosis no clubbing   EKG:  EKG is *** ordered today. Sinus rhythm, rate ***, axis within normal limits, intervals within normal limits, no acute ST-T wave changes. PACs.   Recent Labs: 01/10/2022: BUN 10; Creatinine, Ser 0.80; Hemoglobin 13.1; Platelets 274; Potassium 4.4; Sodium 135    Lipid Panel No results found for: "CHOL", "TRIG", "HDL", "CHOLHDL", "VLDL", "LDLCALC", "LDLDIRECT"    Wt Readings from Last 3 Encounters:  03/18/22 121 lb 6.4 oz (55.1 kg)  03/14/22 121 lb 12.8 oz (55.2 kg)  01/31/22 124 lb (56.2 kg)    Lab Results  Component Value Date   TSH 3.05 01/13/2016     Other studies Reviewed: Additional studies/ records that were reviewed today include: *** Review of the above records demonstrates:    See elsewhere   ASSESSMENT AND PLAN:   PALPITATIONS: *** These have not been  particularly problematic.  No change in therapy.  HTN:   ***  Her blood pressure is labile and she is sensitive to medications.  We agreed that she can take an extra 12 and half milligrams of metoprolol every morning and then as needed through the afternoon.  We will slowly adjust her beta-blocker only since it is the one medicine she seems to tolerate.   Current medicines are reviewed at length with the patient today.  The patient does not have concerns regarding medicines.  The following changes have been made: ***  Labs/ tests ordered today include:    ***  No orders of the defined types  were placed in this encounter.    Disposition:   FU with me in *** months   Signed, Michelle Breeding, MD  06/28/2022 9:39 PM    [place]

## 2022-06-30 ENCOUNTER — Encounter: Payer: Self-pay | Admitting: Cardiology

## 2022-06-30 ENCOUNTER — Ambulatory Visit: Payer: Medicare Other | Attending: Cardiology | Admitting: Cardiology

## 2022-06-30 VITALS — BP 126/78 | HR 74 | Ht 65.0 in | Wt 124.8 lb

## 2022-06-30 DIAGNOSIS — R002 Palpitations: Secondary | ICD-10-CM

## 2022-06-30 DIAGNOSIS — I10 Essential (primary) hypertension: Secondary | ICD-10-CM | POA: Diagnosis not present

## 2022-06-30 NOTE — Patient Instructions (Signed)
  Follow-Up: At [facility], you and your health needs are our priority.  As part of our continuing mission to provide you with exceptional heart care, we have created designated Provider Care Teams.  These Care Teams include your primary Cardiologist (physician) and Advanced Practice Providers (APPs -  Physician Assistants and Nurse Practitioners) who all work together to provide you with the care you need, when you need it.  We recommend signing up for the patient portal called "MyChart".  Sign up information is provided on this After Visit Summary.  MyChart is used to connect with patients for Virtual Visits (Telemedicine).  Patients are able to view lab/test results, encounter notes, upcoming appointments, etc.  Non-urgent messages can be sent to your provider as well.   To learn more about what you can do with MyChart, go to [email or web address].    Your next appointment:   6 month(s)  Provider:   James Hochrein, MD      

## 2022-07-08 DIAGNOSIS — M25552 Pain in left hip: Secondary | ICD-10-CM | POA: Diagnosis not present

## 2022-07-08 DIAGNOSIS — M25551 Pain in right hip: Secondary | ICD-10-CM | POA: Diagnosis not present

## 2022-07-08 DIAGNOSIS — M545 Low back pain, unspecified: Secondary | ICD-10-CM | POA: Diagnosis not present

## 2022-07-19 ENCOUNTER — Ambulatory Visit: Payer: Medicare Other | Admitting: Sports Medicine

## 2022-08-16 NOTE — Progress Notes (Signed)
[facility] Note  08/30/2022     CHIEF COMPLAINT Patient presents for Retina Follow Up   HISTORY OF PRESENT ILLNESS: Michelle Keith is a 89 y.o. female who presents to the clinic today for:  HPI     Retina Follow Up   Patient presents with  Dry AMD.  In both eyes.  This started years ago.  Severity is moderate.  Duration of 8 months.  Since onset it is gradually worsening.  I, the attending physician,  performed the HPI with the patient and updated documentation appropriately.        Comments   Patient feels that the vision is getting worse, she feels that she can not see as well at a distance as she did. She is not using any eye drops at this time.       Last edited by Bernarda Caffey, MD on 08/30/2022  5:33 PM.    Patient feels like she is not seeing quite as well as she was last time she was here, she is supposed to get new glasses next week  Referring physician: [name], MD [place],  [place] [id number]  HISTORICAL INFORMATION:   Selected notes from the MEDICAL RECORD NUMBER Referred by Dr. Bing Plume for evaluation of AMD OU   CURRENT MEDICATIONS: No current outpatient medications on file. (Ophthalmic Drugs)   No current facility-administered medications for this visit. (Ophthalmic Drugs)   Current Outpatient Medications (Other)  Medication Sig   Calcium-Vitamin D-Vitamin K (VIACTIV PO) Take 1 tablet by mouth 2 (two) times daily.   cholecalciferol (VITAMIN D) 1000 units tablet Vitamin D   denosumab (PROLIA) 60 MG/ML SOSY injection Inject 60 mg into the skin once. 2 times a year   metoprolol tartrate (LOPRESSOR) 25 MG tablet Take 25 mg in the AM and 50 mg in the PM. May take a extra 25 mg 1 tablet if Systolic B/P 0000000 or greater   Multiple Vitamins-Minerals (PRESERVISION AREDS 2 PO) Take by mouth.   pantoprazole (PROTONIX) 40 MG tablet Take 40 mg by mouth daily as needed (heartburn). Patient states she takes it every other day  prn   valACYclovir (VALTREX) 500 MG tablet Take 500 mg by mouth as needed.   zolpidem (AMBIEN) 10 MG tablet Take 2.5 mg by mouth daily as needed for sleep.    No current facility-administered medications for this visit. (Other)   REVIEW OF SYSTEMS: ROS   Positive for: Gastrointestinal, Musculoskeletal, Cardiovascular, Eyes Negative for: Constitutional, Neurological, Skin, Genitourinary, HENT, Endocrine, Respiratory, Psychiatric, Allergic/Imm, Heme/Lymph Last edited by Annie Paras, COT on 08/30/2022  1:17 PM.      ALLERGIES Allergies  Allergen Reactions   Epinephrine Anaphylaxis   Lidocaine Anaphylaxis   Aspirin    Atenolol    Clarithromycin    Cortisone Other (See Comments)   Norvasc [Amlodipine] Other (See Comments)    headaches   Omeprazole Magnesium Other (See Comments)    Ask at office visit   Penicillins    Pindolol Diarrhea    Fatigue, hip pain   Pseudoephedrine-Guaifenesin Other (See Comments)   Sulfonamide Derivatives    Tetracycline Hives   Glycol Stearate Palpitations   Valsartan Palpitations   PAST MEDICAL HISTORY Past Medical History:  Diagnosis Date   Dysautonomia ([place])    Esophageal stricture    GERD (gastroesophageal reflux disease)    Hiatal hernia    IBS (irritable bowel syndrome)    Macular degeneration  Dry OU   Osteopenia    Osteoporosis    Right thyroid nodule    Squamous cell skin cancer    Syncope    Past Surgical History:  Procedure Laterality Date   C-EYE SURGERY PROCEDURE     30 yrs ago   Cataract extraction     CATARACT EXTRACTION Bilateral 2001   Dr. Herbert Deaner   COLONOSCOPY  2009   Also 2002   ESOPHAGEAL DILATION     x 4   ESOPHAGEAL MANOMETRY     ESOPHAGOGASTRODUODENOSCOPY  Multiple   Exploratory laparoscopy     EYE SURGERY Bilateral    Cat Sx - Dr. Dorene Ar FUNDOPLICATION     TOTAL ABDOMINAL HYSTERECTOMY     FAMILY HISTORY Family History  Problem Relation Age of Onset   Multiple myeloma Father         died age 62   Heart failure Mother        died age 68   SOCIAL HISTORY Social History   Tobacco Use   Smoking status: Never   Smokeless tobacco: Never  Vaping Use   Vaping Use: Never used  Substance Use Topics   Alcohol use: No    Alcohol/week: 0.0 standard drinks of alcohol   Drug use: No       OPHTHALMIC EXAM:  Base Eye Exam     Visual Acuity (Snellen - Linear)       Right Left   Dist [place] 20/20 +2 20/50   Dist cc 20/40 20/50   Dist ph cc NI NI    Correction: Glasses         Tonometry (Tonopen, 1:26 PM)       Right Left   Pressure 17 12         Pupils       Dark Light Shape React APD   Right 3 2 Round Brisk None   Left 3 2 Round Brisk None         Visual Fields       Left Right    Full Full         Extraocular Movement       Right Left    Full, Ortho Full, Ortho         Neuro/Psych     Oriented x3: Yes   Mood/Affect: Normal         Dilation     Both eyes: 1.0% Mydriacyl, 2.5% Phenylephrine @ 1:18 PM           Slit Lamp and Fundus Exam     External Exam       Right Left   External Normal Normal         Slit Lamp Exam       Right Left   Lids/Lashes Dermatochalasis - upper lid Dermatochalasis - upper lid, mild MGD   Conjunctiva/Sclera White and quiet White and quiet   Cornea arcus, 1+ inferior peripheral Punctate epithelial erosions Arcus; 2-3+inferior Punctate epithelial erosions, +tear film debris, EBMD, peripheral corneal haze   Anterior Chamber Deep and quiet Deep and quiet   Iris Round, dilated Round, dilated   Lens PCIOL; open PC PCIOL; 1+ PCO non central   Anterior Vitreous mild syneresis, PVD, vitreous condensations Mild syneresis, PVD, vitreous condensations         Fundus Exam       Right Left   Disc mild Pallor, Sharp rim, Compact mild Pallor, Sharp rim, Compact   C/D Ratio  0.1 0.1   Macula Flat, Blunted foveal reflex, RPE mottling and clumping; drusen; no heme or edema flat, Blunted foveal  reflex, RPE mottling and clumping, ERM - stable; drusen; no heme or edema   Vessels attenuated, Tortuous attenuated, Tortuous   Periphery Attached, paving stone at 0700, mild Reticular degeneration, No heme attached           Refraction     Wearing Rx       Sphere Cylinder Axis Add   Right +1.00 +0.50 146 +2.75   Left -1.25 +0.75 003 +2.75           IMAGING AND PROCEDURES  Imaging and Procedures for 08/29/17  OCT, Retina - OU - Both Eyes       Right Eye Quality was good. Central Foveal Thickness: 345. Progression has been stable. Findings include normal foveal contour, no IRF, no SRF, retinal drusen , pigment epithelial detachment (Persistent drusen - stable from prior, blunted foveal countour).   Left Eye Quality was good. Central Foveal Thickness: 409. Progression has been stable. Findings include no IRF, no SRF, abnormal foveal contour, retinal drusen , epiretinal membrane (ERM with central retinal thickening -- stable).   Notes Images taken, stored on drive  Diagnosis / Impression:   nonexudative ARMD OU -- persistent drusen -- stable ERM OS -- mild -- stable from prior  Clinical management:  See below  Abbreviations: NFP - Normal foveal profile. CME - cystoid macular edema. PED - pigment epithelial detachment. IRF - intraretinal fluid. SRF - subretinal fluid. EZ - ellipsoid zone. ERM - epiretinal membrane. ORA - outer retinal atrophy. ORT - outer retinal tubulation. SRHM - subretinal hyper-reflective material             ASSESSMENT/PLAN:    ICD-10-CM   1. Intermediate stage nonexudative age-related macular degeneration of both eyes  H35.3132 OCT, Retina - OU - Both Eyes    2. Epiretinal membrane (ERM) of left eye  H35.372     3. Pseudophakia of both eyes  Z96.1     4. Dry eyes  H04.123      1. Age related macular degeneration, non-exudative, both eyes  - mild-intermediate stage OU -- stable drusen on OCT  - The incidence, anatomy, and pathology  of dry AMD, risk of progression, and the AREDS and AREDS 2 study including smoking risks discussed with patient.  - patient was instructed to continue amsler grid monitoring  - recommend f/u 6-9 months, sooner prn -- DFE/OCT  2. Epiretinal membrane, left eye   - very mild ERM, BCVA 20/50 with no metamorphopsia OS  - OCT and exam stable from prior -- reviewed all OCTs dating back to 2018 -- no significant change  - no surgical intervention indicated at this time  - will continue to monitor  - recommend f/u 6-9 months, sooner prn -- DFE/OCT  3. Pseudophakia OU  - s/p CE/IOL OU w/ Dr. Herbert Deaner  - beautiful surgeries, doing well  - continue to monitor  4. Dry Eyes OU  - exam shows persistent PEE (1-2+), ABMD, decreased TBUT and tear film debris  - recommend hot compresses OU BID  - recommend artificial tears and lubricating ointment as needed. Patient was instructed to use Thera tears three times a day.   Ophthalmic Meds Ordered this visit:  No orders of the defined types were placed in this encounter.    Return for f/u 6-9 months, non exu ARMD OU, DFE, OCT.  There are no Patient Instructions on  file for this visit.  This document serves as a record of services personally performed by Gardiner Sleeper, MD, PhD. It was created on their behalf by Renaldo Reel, [place] an ophthalmic technician. The creation of this record is the provider's dictation and/or activities during the visit.    Electronically signed by:  Renaldo Reel, COT 02.27.24 5:34 PM  Gardiner Sleeper, M.D., Ph.D. Diseases & Surgery of the Retina and Vitreous [facility]  I have reviewed the above documentation for accuracy and completeness, and I agree with the above. Gardiner Sleeper, M.D., Ph.D. 08/30/22 5:35 PM   Abbreviations: M myopia (nearsighted); A astigmatism; H hyperopia (farsighted); P presbyopia; Mrx spectacle prescription;  CTL contact lenses; OD right eye; OS left eye; OU both  eyes  XT exotropia; ET esotropia; PEK punctate epithelial keratitis; PEE punctate epithelial erosions; DES dry eye syndrome; MGD meibomian gland dysfunction; ATs artificial tears; PFAT's preservative free artificial tears; [place] nuclear sclerotic cataract; PSC posterior subcapsular cataract; ERM epi-retinal membrane; PVD posterior vitreous detachment; RD retinal detachment; DM diabetes mellitus; DR diabetic retinopathy; NPDR non-proliferative diabetic retinopathy; PDR proliferative diabetic retinopathy; CSME clinically significant macular edema; DME diabetic macular edema; dbh dot blot hemorrhages; CWS cotton wool spot; POAG primary open angle glaucoma; C/D cup-to-disc ratio; HVF humphrey visual field; GVF goldmann visual field; OCT optical coherence tomography; IOP intraocular pressure; BRVO Branch retinal vein occlusion; CRVO central retinal vein occlusion; CRAO central retinal artery occlusion; BRAO branch retinal artery occlusion; RT retinal tear; SB scleral buckle; PPV pars plana vitrectomy; VH Vitreous hemorrhage; PRP panretinal laser photocoagulation; IVK intravitreal kenalog; VMT vitreomacular traction; MH Macular hole;  NVD neovascularization of the disc; NVE neovascularization elsewhere; AREDS age related eye disease study; ARMD age related macular degeneration; POAG primary open angle glaucoma; EBMD epithelial/anterior basement membrane dystrophy; ACIOL anterior chamber intraocular lens; IOL intraocular lens; PCIOL posterior chamber intraocular lens; Phaco/IOL phacoemulsification with intraocular lens placement; [place] photorefractive keratectomy; LASIK laser assisted in situ keratomileusis; HTN hypertension; DM diabetes mellitus; COPD chronic obstructive pulmonary disease

## 2022-08-30 ENCOUNTER — Ambulatory Visit (INDEPENDENT_AMBULATORY_CARE_PROVIDER_SITE_OTHER): Payer: Medicare Other | Admitting: Ophthalmology

## 2022-08-30 ENCOUNTER — Encounter (INDEPENDENT_AMBULATORY_CARE_PROVIDER_SITE_OTHER): Payer: Self-pay | Admitting: Ophthalmology

## 2022-08-30 DIAGNOSIS — L820 Inflamed seborrheic keratosis: Secondary | ICD-10-CM | POA: Diagnosis not present

## 2022-08-30 DIAGNOSIS — D225 Melanocytic nevi of trunk: Secondary | ICD-10-CM | POA: Diagnosis not present

## 2022-08-30 DIAGNOSIS — H353132 Nonexudative age-related macular degeneration, bilateral, intermediate dry stage: Secondary | ICD-10-CM

## 2022-08-30 DIAGNOSIS — H04123 Dry eye syndrome of bilateral lacrimal glands: Secondary | ICD-10-CM | POA: Diagnosis not present

## 2022-08-30 DIAGNOSIS — L570 Actinic keratosis: Secondary | ICD-10-CM | POA: Diagnosis not present

## 2022-08-30 DIAGNOSIS — H35372 Puckering of macula, left eye: Secondary | ICD-10-CM | POA: Diagnosis not present

## 2022-08-30 DIAGNOSIS — Z961 Presence of intraocular lens: Secondary | ICD-10-CM | POA: Diagnosis not present

## 2022-08-30 DIAGNOSIS — X32XXXA Exposure to sunlight, initial encounter: Secondary | ICD-10-CM | POA: Diagnosis not present

## 2022-09-06 DIAGNOSIS — H524 Presbyopia: Secondary | ICD-10-CM | POA: Diagnosis not present

## 2022-09-06 DIAGNOSIS — H35363 Drusen (degenerative) of macula, bilateral: Secondary | ICD-10-CM | POA: Diagnosis not present

## 2022-09-08 DIAGNOSIS — K080 Exfoliation of teeth due to systemic causes: Secondary | ICD-10-CM | POA: Diagnosis not present

## 2022-09-13 ENCOUNTER — Other Ambulatory Visit (HOSPITAL_COMMUNITY): Payer: Self-pay

## 2022-09-15 ENCOUNTER — Ambulatory Visit (HOSPITAL_COMMUNITY)
Admission: RE | Admit: 2022-09-15 | Discharge: 2022-09-15 | Disposition: A | Discharge status: Home / Self Care | Payer: Medicare Other | Source: Ambulatory Visit | Attending: Internal Medicine | Admitting: Internal Medicine

## 2022-09-15 DIAGNOSIS — M810 Age-related osteoporosis without current pathological fracture: Secondary | ICD-10-CM | POA: Diagnosis not present

## 2022-09-15 MED ORDER — DENOSUMAB 60 MG/ML ~~LOC~~ SOSY
PREFILLED_SYRINGE | SUBCUTANEOUS | Status: AC
  Filled 2022-09-15: qty 1

## 2022-09-15 MED ORDER — DENOSUMAB 60 MG/ML ~~LOC~~ SOSY
60.0000 mg | PREFILLED_SYRINGE | Freq: Once | SUBCUTANEOUS | Status: AC
  Administered 2022-09-15: 60 mg via SUBCUTANEOUS

## 2022-09-20 DIAGNOSIS — H524 Presbyopia: Secondary | ICD-10-CM | POA: Diagnosis not present

## 2022-11-09 DIAGNOSIS — I951 Orthostatic hypotension: Secondary | ICD-10-CM | POA: Diagnosis not present

## 2022-11-16 ENCOUNTER — Encounter: Payer: Self-pay | Admitting: Cardiology

## 2022-11-16 NOTE — Progress Notes (Unsigned)
  Cardiology Office Note:   Date:  11/17/2022  ID:  EMILYAH CRAWMER, DOB 1931/07/08, MRN [id number]  History of Present Illness:   Michelle Keith is a 89 y.o. female who presents for follow up of chest pain. She did have a history of syncope. She's had a workup that has included a negative stress perfusion study in 2016. Of note there is mention of a possible VSD in her primary care notes. However, I reviewed the echo results in 2007 and don't see mention of this. She has  had palpitations and has been intolerant of calcium channel blockers and beta blockers.     She wore a Holter in  2017.  There were no significant arrhythmias.  She has been having labile BPs.    Since I last saw her she feels like her "heart is tired in the morning."  However, she has not really had any symptoms that she can describe and is not describing new shortness of breath, chest pressure, neck or arm discomfort.  She has no palpitations, presyncope or syncope.  She walks routinely.  She brings me a blood pressure diary and it is labile but overall reasonably controlled.  She has been under a lot of stress because she had to buy a new car and her hip has been hurting.  ROS: As stated in the HPI and negative for all other systems.  Studies Reviewed:    EKG: Sinus rhythm, rate 53, axis 50 limits, intervals within normal limits, no acute ST-T wave changes.   Risk Assessment/Calculations:          Physical Exam:   VS:  BP (!) 144/94   Pulse (!) 53   Ht 5\' 5"  (1.651 m)   Wt 124 lb (56.2 kg)   BMI 20.63 kg/m    Wt Readings from Last 3 Encounters:  11/17/22 124 lb (56.2 kg)  06/30/22 124 lb 12.8 oz (56.6 kg)  03/18/22 121 lb 6.4 oz (55.1 kg)     GEN: Well nourished, well developed in no acute distress NECK: No JVD; No carotid bruits CARDIAC: RRR, no murmurs, rubs, gallops RESPIRATORY:  Clear to auscultation without rales, wheezing or rhonchi  ABDOMEN: Soft, non-tender, non-distended EXTREMITIES:  No edema; No  deformity   ASSESSMENT AND PLAN:    PALPITATIONS:    She is not experiencing palpitations.  No change in therapy.   HTN:   Her  BP is somewhat labile.  She rarely has to take the beta-blocker 25 mg extra dose but she does have that if she needs it.  We reviewed this.  No further testing.        Signed, Rollene Rotunda, MD

## 2022-11-16 NOTE — Telephone Encounter (Signed)
Error

## 2022-11-17 ENCOUNTER — Ambulatory Visit: Payer: Medicare Other | Admitting: Cardiology

## 2022-11-17 ENCOUNTER — Encounter: Payer: Self-pay | Admitting: Cardiology

## 2022-11-17 ENCOUNTER — Ambulatory Visit: Payer: Medicare Other | Attending: Cardiology | Admitting: Cardiology

## 2022-11-17 VITALS — BP 144/94 | HR 53 | Ht 65.0 in | Wt 124.0 lb

## 2022-11-17 DIAGNOSIS — R002 Palpitations: Secondary | ICD-10-CM | POA: Diagnosis not present

## 2022-11-17 DIAGNOSIS — I10 Essential (primary) hypertension: Secondary | ICD-10-CM

## 2022-11-17 NOTE — Patient Instructions (Signed)
Medication Instructions:  The current medical regimen is effective;  continue present plan and medications.  *If you need a refill on your cardiac medications before your next appointment, please call your pharmacy*   Follow-Up: At [facility], you and your health needs are our priority.  As part of our continuing mission to provide you with exceptional heart care, we have created designated Provider Care Teams.  These Care Teams include your primary Cardiologist (physician) and Advanced Practice Providers (APPs -  Physician Assistants and Nurse Practitioners) who all work together to provide you with the care you need, when you need it.  We recommend signing up for the patient portal called "MyChart".  Sign up information is provided on this After Visit Summary.  MyChart is used to connect with patients for Virtual Visits (Telemedicine).  Patients are able to view lab/test results, encounter notes, upcoming appointments, etc.  Non-urgent messages can be sent to your provider as well.   To learn more about what you can do with MyChart, go to [email or web address].    Your next appointment:   12 month(s)  Provider:   Rollene Rotunda, MD

## 2023-01-18 DIAGNOSIS — M71572 Other bursitis, not elsewhere classified, left ankle and foot: Secondary | ICD-10-CM | POA: Diagnosis not present

## 2023-01-18 DIAGNOSIS — L600 Ingrowing nail: Secondary | ICD-10-CM | POA: Diagnosis not present

## 2023-02-22 NOTE — Progress Notes (Signed)
[facility] - Clinic Note  03/07/2023     CHIEF COMPLAINT Patient presents for Retina Follow Up   HISTORY OF PRESENT ILLNESS: Michelle Keith is a 89 y.o. female who presents to the clinic today for:  HPI     Retina Follow Up   Patient presents with  Dry AMD.  In both eyes.  This started 6 months ago.  Duration of 6 months.  Since onset it is stable.  I, the attending physician,  performed the HPI with the patient and updated documentation appropriately.        Comments   6 month retina eval AMD OU pt is reporting vision is not as sharp she saw Dr Marena Chancy about 3 months ago and he gave her a new RX and told her that was as good as he could get it she did not bring glasses today       Last edited by Rennis Chris, MD on 03/09/2023  5:11 PM.    Patient states she has gotten new glasses since she was here last, but she didn't bring them bc she only uses them when she drives, she uses [employer] TID OU  Referring physician: Rodrigo Ran, MD [address] [place],  [place] [address]  HISTORICAL INFORMATION:   Selected notes from the MEDICAL RECORD NUMBER Referred by Dr. Hazle Quant for evaluation of AMD OU   CURRENT MEDICATIONS: No current outpatient medications on file. (Ophthalmic Drugs)   No current facility-administered medications for this visit. (Ophthalmic Drugs)   Current Outpatient Medications (Other)  Medication Sig   Calcium-Vitamin D-Vitamin K (VIACTIV PO) Take 1 tablet by mouth 2 (two) times daily.   cholecalciferol (VITAMIN D) 1000 units tablet Vitamin D   denosumab (PROLIA) 60 MG/ML SOSY injection Inject 60 mg into the skin once. 2 times a year   metoprolol tartrate (LOPRESSOR) 25 MG tablet Take 25 mg in the AM and 50 mg in the PM. May take a extra 25 mg 1 tablet if Systolic B/P 160 or greater   Multiple Vitamins-Minerals (PRESERVISION AREDS 2 PO) Take by mouth. (Patient not taking: Reported on 11/17/2022)   pantoprazole (PROTONIX) 40 MG tablet  Take 40 mg by mouth daily as needed (heartburn). Patient states she takes it every other day prn   valACYclovir (VALTREX) 500 MG tablet Take 500 mg by mouth as needed. (Patient not taking: Reported on 11/17/2022)   zolpidem (AMBIEN) 10 MG tablet Take 2.5 mg by mouth daily as needed for sleep.    No current facility-administered medications for this visit. (Other)   REVIEW OF SYSTEMS: ROS   Positive for: Gastrointestinal, Musculoskeletal, Cardiovascular, Eyes Negative for: Constitutional, Neurological, Skin, Genitourinary, HENT, Endocrine, Respiratory, Psychiatric, Allergic/Imm, Heme/Lymph Last edited by Etheleen Mayhew, COT on 03/07/2023  1:06 PM.       ALLERGIES Allergies  Allergen Reactions   Epinephrine Anaphylaxis   Lidocaine Anaphylaxis   Aspirin    Atenolol    Clarithromycin    Cortisone Other (See Comments)   Norvasc [Amlodipine] Other (See Comments)    headaches   Omeprazole Magnesium Other (See Comments)    Ask at office visit   Penicillins    Pindolol Diarrhea    Fatigue, hip pain   Pseudoephedrine-Guaifenesin Other (See Comments)   Sulfonamide Derivatives    Tetracycline Hives   Glycol Stearate Palpitations   Valsartan Palpitations   PAST MEDICAL HISTORY Past Medical History:  Diagnosis Date   Dysautonomia (HCC)    Esophageal stricture  GERD (gastroesophageal reflux disease)    Hiatal hernia    IBS (irritable bowel syndrome)    Macular degeneration    Dry OU   Osteopenia    Osteoporosis    Right thyroid nodule    Squamous cell skin cancer    Syncope    Past Surgical History:  Procedure Laterality Date   C-EYE SURGERY PROCEDURE     30 yrs ago   Cataract extraction     CATARACT EXTRACTION Bilateral 2001   Dr. Elmer Picker   COLONOSCOPY  2009   Also 2002   ESOPHAGEAL DILATION     x 4   ESOPHAGEAL MANOMETRY     ESOPHAGOGASTRODUODENOSCOPY  Multiple   Exploratory laparoscopy     EYE SURGERY Bilateral    Cat Sx - Dr. Astrid Divine  FUNDOPLICATION     TOTAL ABDOMINAL HYSTERECTOMY     FAMILY HISTORY Family History  Problem Relation Age of Onset   Multiple myeloma Father        died age 63   Heart failure Mother        died age 86   SOCIAL HISTORY Social History   Tobacco Use   Smoking status: Never   Smokeless tobacco: Never  Vaping Use   Vaping status: Never Used  Substance Use Topics   Alcohol use: No    Alcohol/week: 0.0 standard drinks of alcohol   Drug use: No       OPHTHALMIC EXAM:  Base Eye Exam     Visual Acuity (Snellen - Linear)       Right Left   Dist [place] 20/25 -2 20/50   Dist ph [place] NI NI    Correction: Glasses         Tonometry (Tonopen, 1:12 PM)       Right Left   Pressure 14 15         Pupils       Pupils Dark Light Shape React APD   Right PERRL 3 2 Round Brisk None   Left PERRL 3 2 Round Brisk None         Visual Fields       Left Right    Full Full         Extraocular Movement       Right Left    Full, Ortho Full, Ortho         Neuro/Psych     Oriented x3: Yes   Mood/Affect: Normal         Dilation     Both eyes: 2.5% Phenylephrine @ 1:12 PM           Slit Lamp and Fundus Exam     External Exam       Right Left   External Normal Normal         Slit Lamp Exam       Right Left   Lids/Lashes Dermatochalasis - upper lid, mild MGD Dermatochalasis - upper lid, mild MGD   Conjunctiva/Sclera White and quiet White and quiet   Cornea arcus, 1+ peripheral Punctate epithelial erosions, tear film debris Arcus; 2-3+inferior Punctate epithelial erosions, +tear film debris, EBMD, peripheral corneal haze   Anterior Chamber deep and clear deep and clear   Iris Round, dilated Round, dilated   Lens PCIOL; open PC PCIOL; 1+ PCO non central   Anterior Vitreous mild syneresis, PVD, vitreous condensations Mild syneresis, PVD, vitreous condensations         Fundus Exam  Right Left   Disc mild Pallor, Sharp rim, Compact mild Pallor,  Sharp rim, Compact   C/D Ratio 0.1 0.1   Macula Flat, Blunted foveal reflex, RPE mottling and clumping; drusen; no heme or edema flat, Blunted foveal reflex, RPE mottling and clumping, ERM with stable central thickening; drusen; no heme or edema   Vessels attenuated, mild tortuosity attenuated, Tortuous   Periphery Attached, paving stone at 0700, mild Reticular degeneration, No heme attached           Refraction     Wearing Rx       Sphere Cylinder Axis Add   Right +1.00 +0.50 146 +2.75   Left -1.25 +0.75 003 +2.75           IMAGING AND PROCEDURES  Imaging and Procedures for 08/29/17  OCT, Retina - OU - Both Eyes       Right Eye Quality was good. Central Foveal Thickness: 352. Progression has been stable. Findings include normal foveal contour, no IRF, no SRF, retinal drusen , pigment epithelial detachment (Persistent drusen - stable from prior, blunted foveal contour, interval development of inner retinal pucker nasal mac).   Left Eye Quality was good. Central Foveal Thickness: 424. Progression has been stable. Findings include no IRF, no SRF, abnormal foveal contour, retinal drusen , epiretinal membrane (ERM with central retinal thickening -- stable).   Notes Images taken, stored on drive  Diagnosis / Impression:   nonexudative ARMD OU -- persistent drusen -- stable ERM OS -- mild -- stable from prior  Clinical management:  See below  Abbreviations: NFP - Normal foveal profile. CME - cystoid macular edema. PED - pigment epithelial detachment. IRF - intraretinal fluid. SRF - subretinal fluid. EZ - ellipsoid zone. ERM - epiretinal membrane. ORA - outer retinal atrophy. ORT - outer retinal tubulation. SRHM - subretinal hyper-reflective material             ASSESSMENT/PLAN:    ICD-10-CM   1. Intermediate stage nonexudative age-related macular degeneration of both eyes  H35.3132 OCT, Retina - OU - Both Eyes    2. Epiretinal membrane (ERM) of left eye  H35.372      3. Pseudophakia of both eyes  Z96.1     4. Dry eyes  H04.123       1. Age related macular degeneration, non-exudative, both eyes  - mild-intermediate stage OU -- stable drusen on OCT  - The incidence, anatomy, and pathology of dry AMD, risk of progression, and the AREDS and AREDS 2 study including smoking risks discussed with patient.  - patient was instructed to continue amsler grid monitoring  - recommend f/u 6-9 months, sooner prn -- DFE/OCT  2. Epiretinal membrane, left eye   - very mild ERM, BCVA 20/50 with no metamorphopsia OS  - OCT and exam stable from prior -- reviewed all OCTs dating back to 2018 -- no significant change  - no surgical intervention indicated at this time  - will continue to monitor  - recommend f/u 6-9 months, sooner prn -- DFE/OCT  3. Pseudophakia OU  - s/p CE/IOL OU w/ Dr. Elmer Picker  - beautiful surgeries, doing well  - continue to monitor  4. Dry Eyes OU  - exam shows persistent PEE (1-2+), ABMD, decreased TBUT and tear film debris  - recommend hot compresses OU BID  - recommend artificial tears and lubricating ointment as needed. Patient was instructed to use Thera tears three times a day.   Ophthalmic Meds Ordered this visit:  No  orders of the defined types were placed in this encounter.    Return for f/u 6-9 months, non-exu ARMD OU, DFE, OCT.  There are no Patient Instructions on file for this visit.  This document serves as a record of services personally performed by Karie Chimera, MD, PhD. It was created on their behalf by Laurey Morale, COT an ophthalmic technician. The creation of this record is the provider's dictation and/or activities during the visit.    Electronically signed by:  Charlette Caffey, COT  03/09/23 5:12 PM  This document serves as a record of services personally performed by Karie Chimera, MD, PhD. It was created on their behalf by Glee Arvin. Manson Passey, OA an ophthalmic technician. The creation of this record is  the provider's dictation and/or activities during the visit.    Electronically signed by: Glee Arvin. Manson Passey, OA 03/09/23 5:12 PM  Karie Chimera, M.D., Ph.D. Diseases & Surgery of the Retina and Vitreous [facility]  I have reviewed the above documentation for accuracy and completeness, and I agree with the above. Karie Chimera, M.D., Ph.D. 03/09/23 5:13 PM  Abbreviations: M myopia (nearsighted); A astigmatism; H hyperopia (farsighted); P presbyopia; Mrx spectacle prescription;  CTL contact lenses; OD right eye; OS left eye; OU both eyes  XT exotropia; ET esotropia; PEK punctate epithelial keratitis; PEE punctate epithelial erosions; DES dry eye syndrome; MGD meibomian gland dysfunction; ATs artificial tears; PFAT's preservative free artificial tears; NSC nuclear sclerotic cataract; PSC posterior subcapsular cataract; ERM epi-retinal membrane; PVD posterior vitreous detachment; RD retinal detachment; DM diabetes mellitus; DR diabetic retinopathy; NPDR non-proliferative diabetic retinopathy; PDR proliferative diabetic retinopathy; CSME clinically significant macular edema; DME diabetic macular edema; dbh dot blot hemorrhages; CWS cotton wool spot; POAG primary open angle glaucoma; C/D cup-to-disc ratio; HVF humphrey visual field; GVF goldmann visual field; OCT optical coherence tomography; IOP intraocular pressure; BRVO Branch retinal vein occlusion; CRVO central retinal vein occlusion; CRAO central retinal artery occlusion; BRAO branch retinal artery occlusion; RT retinal tear; SB scleral buckle; PPV pars plana vitrectomy; VH Vitreous hemorrhage; PRP panretinal laser photocoagulation; IVK intravitreal kenalog; VMT vitreomacular traction; MH Macular hole;  NVD neovascularization of the disc; NVE neovascularization elsewhere; AREDS age related eye disease study; ARMD age related macular degeneration; POAG primary open angle glaucoma; EBMD epithelial/anterior basement membrane  dystrophy; ACIOL anterior chamber intraocular lens; IOL intraocular lens; PCIOL posterior chamber intraocular lens; Phaco/IOL phacoemulsification with intraocular lens placement; PRK photorefractive keratectomy; LASIK laser assisted in situ keratomileusis; HTN hypertension; DM diabetes mellitus; COPD chronic obstructive pulmonary disease

## 2023-02-23 ENCOUNTER — Ambulatory Visit: Payer: Medicare Other | Admitting: Cardiology

## 2023-03-07 ENCOUNTER — Encounter (INDEPENDENT_AMBULATORY_CARE_PROVIDER_SITE_OTHER): Payer: Self-pay | Admitting: Ophthalmology

## 2023-03-07 ENCOUNTER — Ambulatory Visit (INDEPENDENT_AMBULATORY_CARE_PROVIDER_SITE_OTHER): Payer: Medicare Other | Admitting: Ophthalmology

## 2023-03-07 DIAGNOSIS — H04123 Dry eye syndrome of bilateral lacrimal glands: Secondary | ICD-10-CM | POA: Diagnosis not present

## 2023-03-07 DIAGNOSIS — Z961 Presence of intraocular lens: Secondary | ICD-10-CM | POA: Diagnosis not present

## 2023-03-07 DIAGNOSIS — H353132 Nonexudative age-related macular degeneration, bilateral, intermediate dry stage: Secondary | ICD-10-CM | POA: Diagnosis not present

## 2023-03-07 DIAGNOSIS — H35372 Puckering of macula, left eye: Secondary | ICD-10-CM | POA: Diagnosis not present

## 2023-03-09 ENCOUNTER — Encounter (INDEPENDENT_AMBULATORY_CARE_PROVIDER_SITE_OTHER): Payer: Self-pay | Admitting: Ophthalmology

## 2023-03-22 ENCOUNTER — Other Ambulatory Visit (HOSPITAL_COMMUNITY): Payer: Self-pay | Admitting: *Deleted

## 2023-03-23 ENCOUNTER — Encounter (HOSPITAL_COMMUNITY): Payer: Medicare Other

## 2023-03-23 DIAGNOSIS — Z1231 Encounter for screening mammogram for malignant neoplasm of breast: Secondary | ICD-10-CM | POA: Diagnosis not present

## 2023-03-27 DIAGNOSIS — K080 Exfoliation of teeth due to systemic causes: Secondary | ICD-10-CM | POA: Diagnosis not present

## 2023-03-30 ENCOUNTER — Ambulatory Visit: Payer: Medicare Other | Admitting: Podiatry

## 2023-03-30 ENCOUNTER — Ambulatory Visit (HOSPITAL_COMMUNITY)
Admission: RE | Admit: 2023-03-30 | Discharge: 2023-03-30 | Disposition: A | Discharge status: Home / Self Care | Payer: Medicare Other | Source: Ambulatory Visit | Attending: Internal Medicine | Admitting: Internal Medicine

## 2023-03-30 ENCOUNTER — Telehealth: Payer: Self-pay | Admitting: Podiatry

## 2023-03-30 DIAGNOSIS — M79672 Pain in left foot: Secondary | ICD-10-CM | POA: Diagnosis not present

## 2023-03-30 DIAGNOSIS — G629 Polyneuropathy, unspecified: Secondary | ICD-10-CM | POA: Diagnosis not present

## 2023-03-30 DIAGNOSIS — M810 Age-related osteoporosis without current pathological fracture: Secondary | ICD-10-CM | POA: Insufficient documentation

## 2023-03-30 DIAGNOSIS — M79671 Pain in right foot: Secondary | ICD-10-CM | POA: Diagnosis not present

## 2023-03-30 MED ORDER — DENOSUMAB 60 MG/ML ~~LOC~~ SOSY
60.0000 mg | PREFILLED_SYRINGE | Freq: Once | SUBCUTANEOUS | Status: AC
  Administered 2023-03-30: 60 mg via SUBCUTANEOUS

## 2023-03-30 MED ORDER — CICLOPIROX 8 % EX SOLN: Freq: Every day | CUTANEOUS | 2 refills | Status: DC

## 2023-03-30 MED ORDER — DENOSUMAB 60 MG/ML ~~LOC~~ SOSY
PREFILLED_SYRINGE | SUBCUTANEOUS | Status: AC
  Filled 2023-03-30: qty 1

## 2023-03-30 NOTE — Telephone Encounter (Signed)
Pt called asking if the toe that you thought was possible fungus or ingrown and she is to be putting medication on it. Would that be contagious or spread to other areas like her eye if she was to have touched her eye after putting the medication on her toe.

## 2023-03-30 NOTE — Patient Instructions (Signed)
I would use capsicin cream on the foot as needed  --  If was nice to meet you today. If you have any questions or any further concerns, please feel fee to give me a call. You can call our office at [phone] or please feel fee to send me a message through MyChart.

## 2023-03-30 NOTE — Progress Notes (Signed)
Subjective:   Patient ID: Michelle Keith, female   DOB: 89 y.o.   MRN: [id number]   HPI No chief complaint on file.  89 year old female presents the office today noting that she has neuropathy.  She has a history of left ankle fracture.  She has numbness, burning to her foot.  She does not recall any recent injuries.  No swelling.  No recent treatment for neuropathy.  Review of Systems  All other systems reviewed and are negative.  Past Medical History:  Diagnosis Date   Dysautonomia (HCC)    Esophageal stricture    GERD (gastroesophageal reflux disease)    Hiatal hernia    IBS (irritable bowel syndrome)    Macular degeneration    Dry OU   Osteopenia    Osteoporosis    Right thyroid nodule    Squamous cell skin cancer    Syncope     Past Surgical History:  Procedure Laterality Date   C-EYE SURGERY PROCEDURE     30 yrs ago   Cataract extraction     CATARACT EXTRACTION Bilateral 2001   Dr. Elmer Picker   COLONOSCOPY  2009   Also 2002   ESOPHAGEAL DILATION     x 4   ESOPHAGEAL MANOMETRY     ESOPHAGOGASTRODUODENOSCOPY  Multiple   Exploratory laparoscopy     EYE SURGERY Bilateral    Cat Sx - Dr. Astrid Divine FUNDOPLICATION     TOTAL ABDOMINAL HYSTERECTOMY       Current Outpatient Medications:    ciclopirox (PENLAC) 8 % solution, Apply topically at bedtime. Apply over nail and surrounding skin. Apply daily over previous coat. After seven (7) days, may remove with alcohol and continue cycle., Disp: 6.6 mL, Rfl: 2   Calcium-Vitamin D-Vitamin K (VIACTIV PO), Take 1 tablet by mouth 2 (two) times daily., Disp: , Rfl:    cholecalciferol (VITAMIN D) 1000 units tablet, Vitamin D, Disp: , Rfl:    denosumab (PROLIA) 60 MG/ML SOSY injection, Inject 60 mg into the skin once. 2 times a year, Disp: , Rfl:    metoprolol tartrate (LOPRESSOR) 25 MG tablet, Take 25 mg in the AM and 50 mg in the PM. May take a extra 25 mg 1 tablet if Systolic B/P 160 or greater, Disp: 360 tablet, Rfl: 3    Multiple Vitamins-Minerals (PRESERVISION AREDS 2 PO), Take by mouth. (Patient not taking: Reported on 11/17/2022), Disp: , Rfl:    pantoprazole (PROTONIX) 40 MG tablet, Take 40 mg by mouth daily as needed (heartburn). Patient states she takes it every other day prn, Disp: , Rfl:    valACYclovir (VALTREX) 500 MG tablet, Take 500 mg by mouth as needed. (Patient not taking: Reported on 11/17/2022), Disp: , Rfl:    zolpidem (AMBIEN) 10 MG tablet, Take 2.5 mg by mouth daily as needed for sleep. , Disp: , Rfl:   Allergies  Allergen Reactions   Epinephrine Anaphylaxis   Lidocaine Anaphylaxis   Aspirin    Atenolol    Clarithromycin    Cortisone Other (See Comments)   Norvasc [Amlodipine] Other (See Comments)    headaches   Omeprazole Magnesium Other (See Comments)    Ask at office visit   Penicillins    Pindolol Diarrhea    Fatigue, hip pain   Pseudoephedrine-Guaifenesin Other (See Comments)   Sulfonamide Derivatives    Tetracycline Hives   Glycol Stearate Palpitations   Valsartan Palpitations          Objective:  Physical Exam  General: AAO x3, NAD  Dermatological: Skin is warm, dry and supple bilateral. There are no open sores, no preulcerative lesions, no rash or signs of infection present.  Vascular: Dorsalis Pedis artery and Posterior Tibial artery pedal pulses are 2/4 bilateral with immedate capillary fill time.There is no pain with calf compression, swelling, warmth, erythema.   Neruologic: Sensation appears to be intact with Phoebe Perch monofilament.  There is no sign.  Musculoskeletal: Digital contractures present.     Assessment:   Concern for neuropathy     Plan:  -Treatment options discussed including all alternatives, risks, and complications -Etiology of symptoms were discussed -Also intact sensation we discussed possible early stages of neuropathy.  Discussed B complex vitamin or alpha lipoic acid.  We also discussed topical medication such as capsaicin  cream.  Discussed supportive shoe gear as well.  Consider nerve conduction test.  Vivi Barrack DPM

## 2023-03-31 NOTE — Telephone Encounter (Signed)
Per Dr Ardelle Anton notified pt that it is not that contagious. But he did recommend her washing her hands after touching her toes.   She said thank you and that she does wash her hands but wanted to make sure it was nto that contagious.

## 2023-04-07 DIAGNOSIS — M545 Low back pain, unspecified: Secondary | ICD-10-CM

## 2023-04-07 DIAGNOSIS — M791 Myalgia, unspecified site: Secondary | ICD-10-CM | POA: Diagnosis not present

## 2023-04-07 HISTORY — DX: Low back pain, unspecified: M54.50

## 2023-04-11 DIAGNOSIS — H6123 Impacted cerumen, bilateral: Secondary | ICD-10-CM | POA: Diagnosis not present

## 2023-04-14 DIAGNOSIS — M791 Myalgia, unspecified site: Secondary | ICD-10-CM | POA: Insufficient documentation

## 2023-04-14 HISTORY — DX: Myalgia, unspecified site: M79.10

## 2023-04-17 DIAGNOSIS — K080 Exfoliation of teeth due to systemic causes: Secondary | ICD-10-CM | POA: Diagnosis not present

## 2023-05-04 ENCOUNTER — Telehealth: Payer: Self-pay | Admitting: Podiatry

## 2023-05-04 NOTE — Telephone Encounter (Signed)
Pt called is using medication for toenail fungus and she read an article in the paper and it said that tea tree oil cleared up there nail fungus quickly. She is asking if she could try the tea tree oil with the medication you gave her or should she wait to try the tea tree oil?

## 2023-05-30 DIAGNOSIS — I10 Essential (primary) hypertension: Secondary | ICD-10-CM | POA: Diagnosis not present

## 2023-05-30 DIAGNOSIS — Z0000 Encounter for general adult medical examination without abnormal findings: Secondary | ICD-10-CM | POA: Diagnosis not present

## 2023-05-30 DIAGNOSIS — Z1331 Encounter for screening for depression: Secondary | ICD-10-CM | POA: Diagnosis not present

## 2023-05-30 DIAGNOSIS — E538 Deficiency of other specified B group vitamins: Secondary | ICD-10-CM | POA: Diagnosis not present

## 2023-05-30 DIAGNOSIS — E559 Vitamin D deficiency, unspecified: Secondary | ICD-10-CM | POA: Diagnosis not present

## 2023-05-30 DIAGNOSIS — R82998 Other abnormal findings in urine: Secondary | ICD-10-CM | POA: Diagnosis not present

## 2023-06-20 ENCOUNTER — Encounter: Payer: Self-pay | Admitting: Podiatry

## 2023-06-20 ENCOUNTER — Ambulatory Visit: Payer: Medicare Other | Admitting: Podiatry

## 2023-06-20 DIAGNOSIS — M79674 Pain in right toe(s): Secondary | ICD-10-CM

## 2023-06-20 DIAGNOSIS — B351 Tinea unguium: Secondary | ICD-10-CM | POA: Diagnosis not present

## 2023-06-20 DIAGNOSIS — M79675 Pain in left toe(s): Secondary | ICD-10-CM | POA: Diagnosis not present

## 2023-06-20 NOTE — Progress Notes (Signed)
 Subjective: Chief Complaint  Patient presents with   RFC    RM#12 RFC nail trim bilateral big toe pain when walking.   89 year old female presents for the above concerns.  She states that left big toenail is turning dark.  She been putting Penlac  on it but she is not able to clean with alcohol  and the medication has not come off.  Other nails get thick and they cause discomfort.  No swelling redness or any drainage.  Objective: AAO x3, NAD DP/PT pulses palpable bilaterally, CRT less than 3 seconds Nails are hypertrophic, dystrophic and there is yellow, brown discoloration mostly on the second digit toenails and nails hypertrophic.  On the left hallux toenail there is quite a bit of ciclopirox  buildup on the nail and once I debrided this there was no hyperpigmentation that seem to be coming from the buildup of the medication.  There is no edema, erythema to the toenails.  There is tenderness all the nails 1-5 bilaterally. There is some dry skin present on the foot mostly on the heel. No pain with calf compression, swelling, warmth, erythema  Assessment: Symptomatic onychomycosis  Plan: -All treatment options discussed with the patient including all alternatives, risks, complications.  -Sharply debrided nails x  without any complications or bleeding.  She is continue with tea tree oil to the nail.  -Moisturizer for the feet daily.  -Daily foot inspection.  -Patient encouraged to call the office with any questions, concerns, change in symptoms.   Michelle Keith Fees DPM

## 2023-06-29 DIAGNOSIS — H00011 Hordeolum externum right upper eyelid: Secondary | ICD-10-CM | POA: Diagnosis not present

## 2023-06-29 DIAGNOSIS — H35363 Drusen (degenerative) of macula, bilateral: Secondary | ICD-10-CM | POA: Diagnosis not present

## 2023-06-29 DIAGNOSIS — H00014 Hordeolum externum left upper eyelid: Secondary | ICD-10-CM | POA: Diagnosis not present

## 2023-07-05 DIAGNOSIS — M791 Myalgia, unspecified site: Secondary | ICD-10-CM | POA: Diagnosis not present

## 2023-07-05 DIAGNOSIS — M25552 Pain in left hip: Secondary | ICD-10-CM | POA: Insufficient documentation

## 2023-07-05 HISTORY — DX: Pain in left hip: M25.552

## 2023-07-13 DIAGNOSIS — M47817 Spondylosis without myelopathy or radiculopathy, lumbosacral region: Secondary | ICD-10-CM | POA: Diagnosis not present

## 2023-07-13 DIAGNOSIS — R000 Tachycardia, unspecified: Secondary | ICD-10-CM | POA: Diagnosis not present

## 2023-07-18 DIAGNOSIS — M51369 Other intervertebral disc degeneration, lumbar region without mention of lumbar back pain or lower extremity pain: Secondary | ICD-10-CM | POA: Insufficient documentation

## 2023-07-18 HISTORY — DX: Other intervertebral disc degeneration, lumbar region without mention of lumbar back pain or lower extremity pain: M51.369

## 2023-07-27 ENCOUNTER — Other Ambulatory Visit: Payer: Self-pay

## 2023-07-27 ENCOUNTER — Encounter (HOSPITAL_BASED_OUTPATIENT_CLINIC_OR_DEPARTMENT_OTHER): Payer: Self-pay | Admitting: Emergency Medicine

## 2023-07-27 ENCOUNTER — Emergency Department (HOSPITAL_BASED_OUTPATIENT_CLINIC_OR_DEPARTMENT_OTHER): Payer: Medicare Other

## 2023-07-27 ENCOUNTER — Emergency Department (HOSPITAL_BASED_OUTPATIENT_CLINIC_OR_DEPARTMENT_OTHER)
Admission: EM | Admit: 2023-07-27 | Discharge: 2023-07-27 | Disposition: A | Discharge status: Home / Self Care | Payer: Medicare Other | Attending: Emergency Medicine | Admitting: Emergency Medicine

## 2023-07-27 DIAGNOSIS — R531 Weakness: Secondary | ICD-10-CM | POA: Insufficient documentation

## 2023-07-27 DIAGNOSIS — G939 Disorder of brain, unspecified: Secondary | ICD-10-CM | POA: Diagnosis not present

## 2023-07-27 DIAGNOSIS — Q283 Other malformations of cerebral vessels: Secondary | ICD-10-CM | POA: Insufficient documentation

## 2023-07-27 DIAGNOSIS — G319 Degenerative disease of nervous system, unspecified: Secondary | ICD-10-CM | POA: Diagnosis not present

## 2023-07-27 DIAGNOSIS — I10 Essential (primary) hypertension: Secondary | ICD-10-CM | POA: Diagnosis not present

## 2023-07-27 DIAGNOSIS — R197 Diarrhea, unspecified: Secondary | ICD-10-CM | POA: Insufficient documentation

## 2023-07-27 DIAGNOSIS — I619 Nontraumatic intracerebral hemorrhage, unspecified: Secondary | ICD-10-CM | POA: Diagnosis not present

## 2023-07-27 DIAGNOSIS — R29818 Other symptoms and signs involving the nervous system: Secondary | ICD-10-CM | POA: Diagnosis not present

## 2023-07-27 DIAGNOSIS — Z8673 Personal history of transient ischemic attack (TIA), and cerebral infarction without residual deficits: Secondary | ICD-10-CM | POA: Diagnosis not present

## 2023-07-27 DIAGNOSIS — D802 Selective deficiency of immunoglobulin A [IgA]: Secondary | ICD-10-CM | POA: Diagnosis not present

## 2023-07-27 DIAGNOSIS — R5383 Other fatigue: Secondary | ICD-10-CM | POA: Diagnosis not present

## 2023-07-27 DIAGNOSIS — I6782 Cerebral ischemia: Secondary | ICD-10-CM | POA: Diagnosis not present

## 2023-07-27 HISTORY — DX: Essential (primary) hypertension: I10

## 2023-07-27 LAB — BASIC METABOLIC PANEL
Anion gap: 6 (ref 5–15)
BUN: 14 mg/dL (ref 8–23)
CO2: 29 mmol/L (ref 22–32)
Calcium: 8.9 mg/dL (ref 8.9–10.3)
Chloride: 99 mmol/L (ref 98–111)
Creatinine, Ser: 0.79 mg/dL (ref 0.44–1.00)
GFR, Estimated: >60 mL/min (ref >60)
Glucose, Bld: 80 mg/dL (ref 70–99)
Potassium: 4 mmol/L (ref 3.5–5.1)
Sodium: 134 mmol/L — ABNORMAL LOW (ref 135–145)

## 2023-07-27 LAB — URINALYSIS, ROUTINE W REFLEX MICROSCOPIC
Bilirubin Urine: NEGATIVE
Glucose, UA: NEGATIVE mg/dL
Hgb urine dipstick: NEGATIVE
Ketones, ur: NEGATIVE mg/dL
Leukocytes,Ua: NEGATIVE
Nitrite: NEGATIVE
Protein, ur: NEGATIVE mg/dL
Specific Gravity, Urine: 1.007 (ref 1.005–1.030)
pH: 6.5 (ref 5.0–8.0)

## 2023-07-27 LAB — CBC
HCT: 39.7 % (ref 36.0–46.0)
Hemoglobin: 12.8 g/dL (ref 12.0–15.0)
MCH: 31.2 pg (ref 26.0–34.0)
MCHC: 32.2 g/dL (ref 30.0–36.0)
MCV: 96.8 fL (ref 80.0–100.0)
Platelets: 310 10*3/uL (ref 150–400)
RBC: 4.1 MIL/uL (ref 3.87–5.11)
RDW: 13.5 % (ref 11.5–15.5)
WBC: 6.5 10*3/uL (ref 4.0–10.5)
nRBC: 0 % (ref 0.0–0.2)

## 2023-07-27 MED ORDER — GADOBUTROL 1 MMOL/ML IV SOLN
5.0000 mL | Freq: Once | INTRAVENOUS | Status: AC | PRN
  Administered 2023-07-27: 5 mL via INTRAVENOUS

## 2023-07-27 MED ORDER — ONDANSETRON HCL 4 MG/2ML IJ SOLN: 4.0000 mg | Freq: Once | INTRAMUSCULAR | Status: DC

## 2023-07-27 MED ORDER — LORAZEPAM 1 MG PO TABS: 1.0000 mg | ORAL_TABLET | Freq: Once | ORAL | Status: DC | PRN

## 2023-07-27 MED ORDER — LABETALOL HCL 5 MG/ML IV SOLN
10.0000 mg | Freq: Once | INTRAVENOUS | Status: AC
  Administered 2023-07-27: 10 mg via INTRAVENOUS
  Filled 2023-07-27: qty 4

## 2023-07-27 NOTE — ED Notes (Signed)
 RN reviewed discharge instructions with pt. Pt verbalized understanding and had no further questions. VSS upon discharge.

## 2023-07-27 NOTE — Discharge Instructions (Signed)
 Please follow-up closely with your primary care doctor to follow-up on your cavernoma neurosurgical recommendations.  Additionally I recommend reaching back out to them regarding the generalized weakness that you have been experiencing for the last month.  Please return to the emergency department if you have sudden onset new or worsening headache, new numbness, weakness, vision changes, confusion. Continue taking your home blood pressure medications as prescribed.

## 2023-07-27 NOTE — ED Triage Notes (Signed)
 Pt endorses fatigue and weakness x 1 month, yesterday "my head felt numb".  Denies extremity weakness or confusion

## 2023-07-27 NOTE — Consult Note (Signed)
 Reason for Consult:45mm hemorrhage, possible cavernoma Referring Physician: Lenor clovis Michelle Keith is an 89 y.o. female.  HPI: whom yesterday while shopping felt numbness in her scalp and in the back of her neck. She was able to drive herself home. Came in today due to persistence of symptoms. Has lost ~8-10 lbs since December, and has generalized weakness over last 4-6 months. Called for evaluation.  Past Medical History:  Diagnosis Date   Dysautonomia (HCC)    Esophageal stricture    GERD (gastroesophageal reflux disease)    Hiatal hernia    Hypertension    IBS (irritable bowel syndrome)    Macular degeneration    Dry OU   Osteopenia    Osteoporosis    Right thyroid  nodule    Squamous cell skin cancer    Syncope     Past Surgical History:  Procedure Laterality Date   C-EYE SURGERY PROCEDURE     30 yrs ago   Cataract extraction     CATARACT EXTRACTION Bilateral 2001   Dr. Cleatus   COLONOSCOPY  2009   Also 2002   ESOPHAGEAL DILATION     x 4   ESOPHAGEAL MANOMETRY     ESOPHAGOGASTRODUODENOSCOPY  Multiple   Exploratory laparoscopy     EYE SURGERY Bilateral    Cat Sx - Dr. Cleatus BLASE FUNDOPLICATION     TOTAL ABDOMINAL HYSTERECTOMY      Family History  Problem Relation Age of Onset   Multiple myeloma Father        died age 65   Heart failure Mother        died age 52    Social History:  reports that she has never smoked. She has never used smokeless tobacco. She reports that she does not drink alcohol  and does not use drugs.  Allergies:  Allergies  Allergen Reactions   Epinephrine Anaphylaxis   Lidocaine Anaphylaxis   Aspirin    Atenolol    Clarithromycin    Cortisone Other (See Comments)   Hydrocodone Nausea And Vomiting   Norvasc  [Amlodipine ] Other (See Comments)    headaches   Omeprazole Magnesium Other (See Comments)    Ask at office visit   Penicillins    Pindolol  Diarrhea    Fatigue, hip pain   Pseudoephedrine-Guaifenesin Other (See  Comments)   Sulfonamide Derivatives    Tetracycline Hives   Glycol Stearate Palpitations   Tramadol  Palpitations   Valsartan Palpitations    Medications: I have reviewed the patient's current medications.  Results for orders placed or performed during the hospital encounter of 07/27/23 (from the past 48 hours)  Basic metabolic panel     Status: Abnormal   Collection Time: 07/27/23 10:02 AM  Result Value Ref Range   Sodium 134 (L) 135 - 145 mmol/L   Potassium 4.0 3.5 - 5.1 mmol/L   Chloride 99 98 - 111 mmol/L   CO2 29 22 - 32 mmol/L   Glucose, Bld 80 70 - 99 mg/dL    Comment: Glucose reference range applies only to samples taken after fasting for at least 8 hours.   BUN 14 8 - 23 mg/dL   Creatinine, Ser 9.20 0.44 - 1.00 mg/dL   Calcium 8.9 8.9 - 89.6 mg/dL   GFR, Estimated >39 >39 mL/min    Comment: (NOTE) Calculated using the CKD-EPI Creatinine Equation (2021)    Anion gap 6 5 - 15    Comment: Performed at [facility], [address]  [place], [place] [address]  CBC     Status: None   Collection Time: 07/27/23 10:02 AM  Result Value Ref Range   WBC 6.5 4.0 - 10.5 K/uL   RBC 4.10 3.87 - 5.11 MIL/uL   Hemoglobin 12.8 12.0 - 15.0 g/dL   HCT 60.2 63.9 - 53.9 %   MCV 96.8 80.0 - 100.0 fL   MCH 31.2 26.0 - 34.0 pg   MCHC 32.2 30.0 - 36.0 g/dL   RDW 86.4 88.4 - 84.4 %   Platelets 310 150 - 400 K/uL   nRBC 0.0 0.0 - 0.2 %    Comment: Performed at [facility], [address] [place], [place] [address]  Urinalysis, Routine w reflex microscopic -Urine, Clean Catch     Status: None   Collection Time: 07/27/23  5:19 PM  Result Value Ref Range   Color, Urine YELLOW YELLOW   APPearance CLEAR CLEAR   Specific Gravity, Urine 1.007 1.005 - 1.030   pH 6.5 5.0 - 8.0   Glucose, UA NEGATIVE NEGATIVE mg/dL   Hgb urine dipstick NEGATIVE NEGATIVE   Bilirubin Urine NEGATIVE NEGATIVE   Ketones, ur NEGATIVE NEGATIVE mg/dL   Protein, ur NEGATIVE  NEGATIVE mg/dL   Nitrite NEGATIVE NEGATIVE   Leukocytes,Ua NEGATIVE NEGATIVE    Comment: Performed at [facility], [address], [place], [place] [address]    MR Brain W and Wo Contrast Result Date: 07/27/2023 CLINICAL DATA:  Provided history: Neuro deficit, acute, stroke suspected. EXAM: MRI HEAD WITHOUT AND WITH CONTRAST TECHNIQUE: Multiplanar, multiecho pulse sequences of the brain and surrounding structures were obtained without and with intravenous contrast. CONTRAST:  5mL GADAVIST  GADOBUTROL  1 MMOL/ML IV SOLN COMPARISON:  Head CT 07/27/2023. FINDINGS: Brain: Generalized parenchymal atrophy. Round 8 mm lesion within the right corona radiata with associated susceptibility-weighted signal loss. Subtle enhancement of the lesion is questioned, although areas of pre-contrast T1 hyperintense signal limit evaluation. There is suggestion of a small adjacent developmental venous anomaly (for instance as seen on series 15, image 92). The 8 mm lesion is favored to reflect a cavernoma. However, a hemorrhagic metastasis is difficult to definitively exclude. There is T2 FLAIR hyperintense signal abnormality surrounding the lesion spanning 14 mm, which could potentially reflect edema related to recent hemorrhage (or related to a metastasis). Mild-to-moderate multifocal T2 FLAIR hyperintense signal abnormality elsewhere within the cerebral white matter, nonspecific but compatible with chronic small vessel ischemic disease. No cortical encephalomalacia is identified. There is no acute infarct. No extra-axial fluid collection. No midline shift. Vascular: Maintained flow voids within the proximal large arterial vessels. Skull and upper cervical spine: No focal worrisome marrow lesion. Sinuses/Orbits: No mass or acute finding within the imaged orbits. Prior right ocular lens replacement. No significant paranasal sinus disease. IMPRESSION: 1. 8 mm lesion within the right corona radiata, corresponding  with the finding described on the head CT performed earlier today. This is favored to reflect a cavernoma with a small adjacent developmental venous anomaly. There is surrounding T2 FLAIR hyperintense signal abnormality which could reflect edema related to recent hemorrhage. However, a hemorrhagic metastasis cannot be excluded and a short-interval follow-up brain MRI (with and without contrast) is recommended in 6-8 weeks for surveillance. 2. Background mild-to-moderate cerebral white matter chronic small vessel ischemic disease. 3. Mild generalized parenchymal atrophy. Electronically Signed   By: Rockey Childs D.O.   On: 07/27/2023 19:05   CT Head Wo Contrast Result Date: 07/27/2023 CLINICAL DATA:  Neuro deficit, acute, stroke suspected. Patient  reports fatigue and weakness for 1 month. Patient states that her head felt numb a ester day. EXAM: CT HEAD WITHOUT CONTRAST TECHNIQUE: Contiguous axial images were obtained from the base of the skull through the vertex without intravenous contrast. RADIATION DOSE REDUCTION: This exam was performed according to the departmental dose-optimization program which includes automated exposure control, adjustment of the mA and/or kV according to patient size and/or use of iterative reconstruction technique. COMPARISON:  None Available. FINDINGS: Brain: A 6 mm hemorrhagic focus present in the anterior right corona radiata. No other focal hemorrhagic lesions or other mass lesion is evident. Focal subcortical hypoattenuation is present in the external capsule bilaterally, right greater than left. Other scattered periventricular and subcortical white matter hypoattenuation is mildly advanced for age. Acute cortical infarct, hemorrhage or mass lesion is present. Vascular: No hyperdense vessel or unexpected calcification. Skull: Calvarium is intact. No focal lytic or blastic lesions are present. No significant extracranial soft tissue lesion is present. Sinuses/Orbits: The paranasal  sinuses and mastoid air cells are clear. The globes and orbits are within normal limits. IMPRESSION: 1. 6 mm hemorrhagic focus in the anterior right corona radiata. This is most concerning for a small hemorrhagic metastasis. The location and appearance is somewhat atypical for a hypertensive hemorrhage. 2. Focal subcortical hypoattenuation in the external capsule bilaterally, right greater than left. While this may be related to chronic microvascular ischemia, edema associated with other small metastases is also considered. Recommend MRI the brain without and with contrast for further evaluation. 3. Other scattered periventricular and subcortical white matter hypoattenuation is mildly advanced for age. This likely reflects the sequela of chronic microvascular ischemia. Critical Value/emergent results were called by telephone at the time of interpretation on 07/27/2023 at 1:42 pm to provider [facility] , who verbally acknowledged these results. Electronically Signed   By: Lonni Necessary M.D.   On: 07/27/2023 13:42    Review of Systems  Constitutional:  Positive for activity change and unexpected weight change.  HENT: Negative.    Eyes: Negative.   Respiratory: Negative.    Cardiovascular: Negative.   Gastrointestinal: Negative.   Endocrine: Negative.   Genitourinary: Negative.   Musculoskeletal: Negative.   Skin: Negative.   Allergic/Immunologic: Negative.   Neurological:  Positive for weakness and numbness.  Hematological: Negative.   Psychiatric/Behavioral: Negative.     Blood pressure (!) 166/87, pulse 86, temperature 97.8 F (36.6 C), temperature source Oral, resp. rate 16, weight 51.5 kg, SpO2 97%. Physical Exam Constitutional:      General: She is not in acute distress.    Appearance: Normal appearance. She is normal weight.  HENT:     Head: Normocephalic and atraumatic.     Right Ear: External ear normal.     Left Ear: External ear normal.     Nose: Nose normal.      Mouth/Throat:     Mouth: Mucous membranes are moist.     Pharynx: Oropharynx is clear.  Eyes:     Extraocular Movements: Extraocular movements intact.     Pupils: Pupils are equal, round, and reactive to light.  Cardiovascular:     Rate and Rhythm: Normal rate and regular rhythm.  Pulmonary:     Effort: Pulmonary effort is normal.  Abdominal:     General: Abdomen is flat.  Genitourinary:    General: Normal vulva.  Musculoskeletal:        General: Normal range of motion.     Cervical back: Normal range of motion.  Skin:  General: Skin is warm and dry.  Neurological:     General: No focal deficit present.     Mental Status: She is alert and oriented to person, place, and time. Mental status is at baseline.     Cranial Nerves: Cranial nerves 2-12 are intact.     Sensory: Sensation is intact.     Motor: Motor function is intact. No weakness.     Coordination: Coordination is intact.     Comments: Gait not assessed.  No drift on exam  Psychiatric:        Mood and Affect: Mood normal.        Behavior: Behavior normal.        Thought Content: Thought content normal.        Judgment: Judgment normal.     Assessment/Plan: Michelle Keith is a 89 y.o. female With a probable cavernoma with hemorrhage, lesion approximately 1cm. No shift, no evidence of other lesions. While metastatic tumor was mentioned I still believe cavernous Angioma. Mrs. Kuri has A normal exam. Hospitalization not needed. She is doing well, does not need follow up. She and her family were explicit in rejecting any operative treatment. The blood should resolve quickly.  Rockey Peru 07/27/2023, 8:21 PM

## 2023-07-27 NOTE — ED Provider Notes (Signed)
 [place] EMERGENCY DEPARTMENT AT [facility] Provider Note   CSN: [id number] Arrival date & time: 07/27/23  9062     History  Chief Complaint  Patient presents with   Fatigue    Michelle Keith is a 89 y.o. female past medical history significant for hypertension, heart palpitations, dysautonomia, IBS, osteoporosis who presents with concern for generalized fatigue and weakness for 1 month.  Yesterday while she was out shopping she reports some numbness at the posterior occiput that lasted for around 30 minutes before resolving spontaneously when she was able to lay down and rest.  She denies headache.  She denies any unilateral numbness, weakness.  She has some left greater than right neuropathy but reports no significant change from her baseline.  Denies any vision changes, double vision, confusion.  She does report that she has had some diarrhea recently but she generally drink some Pedialyte when she is having it.  No fever or chills at home.  HPI     Home Medications Prior to Admission medications   Medication Sig Start Date End Date Taking? Authorizing Provider  Calcium-Vitamin D -Vitamin K (VIACTIV PO) Take 1 tablet by mouth 2 (two) times daily.    [provider]  cholecalciferol  (VITAMIN D ) 1000 units tablet Vitamin D     [provider]  ciclopirox  (PENLAC ) 8 % solution Apply topically at bedtime. Apply over nail and surrounding skin. Apply daily over previous coat. After seven (7) days, may remove with alcohol  and continue cycle. 03/30/23   Gershon Donnice SAUNDERS, DPM  denosumab  (PROLIA ) 60 MG/ML SOSY injection Inject 60 mg into the skin once. 2 times a year    [provider]  metoprolol  tartrate (LOPRESSOR ) 25 MG tablet Take 25 mg in the AM and 50 mg in the PM. May take a extra 25 mg 1 tablet if Systolic B/P 160 or greater 04/19/22   Lavona Agent, MD  Multiple Vitamins-Minerals (PRESERVISION AREDS 2 PO) Take by mouth.    [provider]  pantoprazole  (PROTONIX ) 40 MG tablet Take 40 mg by mouth daily as needed (heartburn). Patient states she takes it every other day prn    [provider]  valACYclovir (VALTREX) 500 MG tablet Take 500 mg by mouth as needed.    [provider]  zolpidem  (AMBIEN ) 10 MG tablet Take 2.5 mg by mouth daily as needed for sleep.     [provider]      Allergies    Epinephrine, Lidocaine, Aspirin, Atenolol, Clarithromycin, Cortisone, Hydrocodone, Norvasc  [amlodipine ], Omeprazole magnesium, Penicillins, Pindolol , Pseudoephedrine-guaifenesin, Sulfonamide derivatives, Tetracycline, Glycol stearate, Tramadol , and Valsartan    Review of Systems   Review of Systems  All other systems reviewed and are negative.   Physical Exam Updated Vital Signs BP (!) 166/87   Pulse 86   Temp 97.8 F (36.6 C) (Oral)   Resp 16   Wt 51.5 kg   SpO2 97%   BMI 18.90 kg/m  Physical Exam Vitals and nursing note reviewed.  Constitutional:      General: She is not in acute distress.    Appearance: Normal appearance.  HENT:     Head: Normocephalic and atraumatic.  Eyes:     General:        Right eye: No discharge.        Left eye: No discharge.  Cardiovascular:     Rate and Rhythm: Normal rate and regular rhythm.     Heart sounds: No murmur heard.  No friction rub. No gallop.  Pulmonary:     Effort: Pulmonary effort is normal.     Breath sounds: Normal breath sounds.  Abdominal:     General: Bowel sounds are normal.     Palpations: Abdomen is soft.  Skin:    General: Skin is warm and dry.     Capillary Refill: Capillary refill takes less than 2 seconds.  Neurological:     Mental Status: She is alert and oriented to person, place, and time.     Comments: Cranial nerves II through XII grossly intact.  Intact finger-nose, intact heel-to-shin.  Romberg negative, gait normal.  Alert and oriented x3.  Moves all 4 limbs spontaneously, normal coordination.  No pronator drift.   Intact strength 5 out of 5 bilateral upper and lower extremities.  She endorses some sensory discrepancy, left lower extremity is less sensitive than right lower extremity, but she reports that this is her baseline with her neuropathy.  Psychiatric:        Mood and Affect: Mood normal.        Behavior: Behavior normal.     ED Results / Procedures / Treatments   Labs (all labs ordered are listed, but only abnormal results are displayed) Labs Reviewed  BASIC METABOLIC PANEL - Abnormal; Notable for the following components:      Result Value   Sodium 134 (*)    All other components within normal limits  CBC  URINALYSIS, ROUTINE W REFLEX MICROSCOPIC    EKG EKG Interpretation Date/Time:  Thursday July 27 2023 09:57:06 EST Ventricular Rate:  62 PR Interval:  148 QRS Duration:  80 QT Interval:  400 QTC Calculation: 406 R Axis:   11  Text Interpretation: Normal sinus rhythm Minimal voltage criteria for LVH, may be normal variant ( R in aVL ) Possible Anterior infarct , age undetermined Abnormal ECG When compared with ECG of 10-Jan-2022 14:57, PREVIOUS ECG IS PRESENT Confirmed by Zackowski, Scott [phone]) on 07/27/2023 1:10:47 PM  Radiology MR Brain W and Wo Contrast Result Date: 07/27/2023 CLINICAL DATA:  Provided history: Neuro deficit, acute, stroke suspected. EXAM: MRI HEAD WITHOUT AND WITH CONTRAST TECHNIQUE: Multiplanar, multiecho pulse sequences of the brain and surrounding structures were obtained without and with intravenous contrast. CONTRAST:  5mL GADAVIST  GADOBUTROL  1 MMOL/ML IV SOLN COMPARISON:  Head CT 07/27/2023. FINDINGS: Brain: Generalized parenchymal atrophy. Round 8 mm lesion within the right corona radiata with associated susceptibility-weighted signal loss. Subtle enhancement of the lesion is questioned, although areas of pre-contrast T1 hyperintense signal limit evaluation. There is suggestion of a small adjacent developmental venous anomaly (for instance as seen on  series 15, image 92). The 8 mm lesion is favored to reflect a cavernoma. However, a hemorrhagic metastasis is difficult to definitively exclude. There is T2 FLAIR hyperintense signal abnormality surrounding the lesion spanning 14 mm, which could potentially reflect edema related to recent hemorrhage (or related to a metastasis). Mild-to-moderate multifocal T2 FLAIR hyperintense signal abnormality elsewhere within the cerebral white matter, nonspecific but compatible with chronic small vessel ischemic disease. No cortical encephalomalacia is identified. There is no acute infarct. No extra-axial fluid collection. No midline shift. Vascular: Maintained flow voids within the proximal large arterial vessels. Skull and upper cervical spine: No focal worrisome marrow lesion. Sinuses/Orbits: No mass or acute finding within the imaged orbits. Prior right ocular lens replacement. No significant paranasal sinus disease. IMPRESSION: 1. 8 mm lesion within the right corona radiata, corresponding with the finding described on the head CT  performed earlier today. This is favored to reflect a cavernoma with a small adjacent developmental venous anomaly. There is surrounding T2 FLAIR hyperintense signal abnormality which could reflect edema related to recent hemorrhage. However, a hemorrhagic metastasis cannot be excluded and a short-interval follow-up brain MRI (with and without contrast) is recommended in 6-8 weeks for surveillance. 2. Background mild-to-moderate cerebral white matter chronic small vessel ischemic disease. 3. Mild generalized parenchymal atrophy. Electronically Signed   By: Rockey Childs D.O.   On: 07/27/2023 19:05   CT Head Wo Contrast Result Date: 07/27/2023 CLINICAL DATA:  Neuro deficit, acute, stroke suspected. Patient reports fatigue and weakness for 1 month. Patient states that her head felt numb a ester day. EXAM: CT HEAD WITHOUT CONTRAST TECHNIQUE: Contiguous axial images were obtained from the base of the  skull through the vertex without intravenous contrast. RADIATION DOSE REDUCTION: This exam was performed according to the departmental dose-optimization program which includes automated exposure control, adjustment of the mA and/or kV according to patient size and/or use of iterative reconstruction technique. COMPARISON:  None Available. FINDINGS: Brain: A 6 mm hemorrhagic focus present in the anterior right corona radiata. No other focal hemorrhagic lesions or other mass lesion is evident. Focal subcortical hypoattenuation is present in the external capsule bilaterally, right greater than left. Other scattered periventricular and subcortical white matter hypoattenuation is mildly advanced for age. Acute cortical infarct, hemorrhage or mass lesion is present. Vascular: No hyperdense vessel or unexpected calcification. Skull: Calvarium is intact. No focal lytic or blastic lesions are present. No significant extracranial soft tissue lesion is present. Sinuses/Orbits: The paranasal sinuses and mastoid air cells are clear. The globes and orbits are within normal limits. IMPRESSION: 1. 6 mm hemorrhagic focus in the anterior right corona radiata. This is most concerning for a small hemorrhagic metastasis. The location and appearance is somewhat atypical for a hypertensive hemorrhage. 2. Focal subcortical hypoattenuation in the external capsule bilaterally, right greater than left. While this may be related to chronic microvascular ischemia, edema associated with other small metastases is also considered. Recommend MRI the brain without and with contrast for further evaluation. 3. Other scattered periventricular and subcortical white matter hypoattenuation is mildly advanced for age. This likely reflects the sequela of chronic microvascular ischemia. Critical Value/emergent results were called by telephone at the time of interpretation on 07/27/2023 at 1:42 pm to provider [facility] , who verbally acknowledged these  results. Electronically Signed   By: Lonni Necessary M.D.   On: 07/27/2023 13:42    Procedures Procedures    Medications Ordered in ED Medications  LORazepam  (ATIVAN ) tablet 1 mg (has no administration in time range)  ondansetron  (ZOFRAN ) injection 4 mg (0 mg Intravenous Hold 07/27/23 1522)  labetalol  (NORMODYNE ) injection 10 mg (10 mg Intravenous Given 07/27/23 1423)  gadobutrol  (GADAVIST ) 1 MMOL/ML injection 5 mL (5 mLs Intravenous Contrast Given 07/27/23 1756)    ED Course/ Medical Decision Making/ A&P Clinical Course as of 07/27/23 2022  Thu Jul 27, 2023  1341 6mm right anterior corona radiata hemorrhagic stroke noted -- some white matter hypoattenuation vs chronic ischemic change. No mass effect [CP]    Clinical Course User Index [CP] Rosan Sherlean DEL, PA-C                                 Medical Decision Making Amount and/or Complexity of Data Reviewed Labs: ordered. Radiology: ordered.  Risk Prescription drug management.  This patient is a 89 y.o. female  who presents to the ED for concern of weakness.   Differential diagnoses prior to evaluation: The emergent differential diagnosis includes, but is not limited to,  CVA, spinal cord injury, ACS, arrhythmia, syncope, orthostatic hypotension, sepsis, hypoglycemia, hypoxia, electrolyte disturbance, endocrine disorder, anemia, environmental exposure, polypharmacy . This is not an exhaustive differential.   Past Medical History / Co-morbidities / Social History: hypertension, heart palpitations, dysautonomia, IBS, osteoporosis  Additional history: Chart reviewed. Pertinent results include: Reviewed recent cardiology visit from previous emergency department  Physical Exam: Physical exam performed. The pertinent findings include: No focal neurologic deficits, somewhat hypertensive in the emergency department, blood pressure 136/73, vital signs otherwise unremarkable  Lab Tests/Imaging studies: I personally  interpreted labs/imaging and the pertinent results include: CBC unremarkable, BMP notable for mild hyponatremia, sodium 134. UA pending. I independently interpreted CT head without contrast which shows no evidence of acute intracranial abnormality. I independently interpreted an MR brain w and wo contrast which shows a probably 8mm cavernoma in the right corona radiata with some surrounding edema vs hemorrhage. No mass effect. I agree with the radiologist interpretation.  Cardiac monitoring: EKG obtained and interpreted by myself and attending physician which shows: NSR, no significant change from baseline   Medications: I ordered medication including ativan  prn for anxiety for MRI, labetalol  for BP control insetting of hemorrhagic stroke.  I have reviewed the patients home medicines and have made adjustments as needed.  Consults: I spoke with the neurosurgeon, Dr. Gillie who does not think that patient's symptoms are necessarily related to her newly found cavernoma, recent hemorrhage, edema, and will come to evaluate the patient in person to make an official assessment regarding disposition.   Disposition: After consideration of the diagnostic results and the patients response to treatment, I feel that patient stable for discharge after Neurosurgery evaluation by Dr. Gillie -- she will have a close follow up with primary care physician to follow up on new cavernoma and her generalized weakness. No clear source of generalized weakness on ED exam, but able to ambulate, at neurologic baseline, no focal deficits.   emergency department workup does not suggest an emergent condition requiring admission or immediate intervention beyond what has been performed at this time. The plan is: as above, patient stable for discharge home with close PCP follow up. The patient is safe for discharge and has been instructed to return immediately for worsening symptoms, change in symptoms or any other concerns.  Final  Clinical Impression(s) / ED Diagnoses Final diagnoses:  Cerebral cavernoma  Generalized weakness    Rx / DC Orders ED Discharge Orders     None         Rosan Sherlean VEAR DEVONNA 07/27/23 2022    Geraldene Hamilton, MD 07/28/23 2354

## 2023-07-27 NOTE — ED Provider Notes (Addendum)
 Patient has had fatigue and weakness for a month.  Yesterday while at the grocery store while checking out got kind of a numbness in the back of her neck and over the top of her head.  She felt a little unsteady on her feet.  Patient may be has some visual changes over the last month.  Patient's had fatigue and weakness for the last month.  Neuroexam here without any acute findings.  Patient's basic metabolic panel without significant abnormality CBC is normal.  CT head however shows a 6 mm hemorrhagic focus in the anterior right corona radiata most concerning for small hemorrhage metastasis.  Based on this we will get MRI.  Patient's blood pressure upon arrival was 176/73 but currently is slight 194 systolic.  We will start her on some medication to help control the blood pressure.  No will start with beta-blocker.  Once the brain MRI results are known we will discuss with neurosurgery or neurology or both as indicated.  Family knows that patient will require admission.  CRITICAL CARE Performed by: Garlene Apperson Total critical care time: 45 minutes Critical care time was exclusive of separately billable procedures and treating other patients. Critical care was necessary to treat or prevent imminent or life-threatening deterioration. Critical care was time spent personally by me on the following activities: development of treatment plan with patient and/or surrogate as well as nursing, discussions with consultants, evaluation of patient's response to treatment, examination of patient, obtaining history from patient or surrogate, ordering and performing treatments and interventions, ordering and review of laboratory studies, ordering and review of radiographic studies, pulse oximetry and re-evaluation of patient's condition.  Patient is not on blood thinners.    Farmer Mccahill, MD 07/27/23 1417    Dali Kraner, MD 07/27/23 [id number]

## 2023-08-01 DIAGNOSIS — D1800 Hemangioma unspecified site: Secondary | ICD-10-CM | POA: Diagnosis not present

## 2023-08-01 DIAGNOSIS — Z1389 Encounter for screening for other disorder: Secondary | ICD-10-CM | POA: Diagnosis not present

## 2023-08-07 ENCOUNTER — Telehealth: Payer: Self-pay | Admitting: Cardiology

## 2023-08-07 NOTE — Telephone Encounter (Signed)
 Spoke to patient's daughter she stated mother has been having weakness the past 2 months.Yesterday she had chest pain.EMS was called.She was told her B/P drops when she stands up.Also blood sugar elevated.She did not go to ED.She rested well last night.No chest pain this morning.Appointment scheduled with Azalee Course PA 2/18 at 2:45 pm.

## 2023-08-07 NOTE — Telephone Encounter (Signed)
 Pt c/o of Chest Pain: STAT if CP now or developed within 24 hours  1. Are you having CP right now? No   2. Are you experiencing any other symptoms (ex. SOB, nausea, vomiting, sweating)? Weakness when standing up.   3. How long have you been experiencing CP? Just on 02/17   4. Is your CP continuous or coming and going? Coming and going   5. Have you taken Nitroglycerin? No   Patient's daughter states that they called EMS in the morning due to chest pain and weakness. States that her BP drops when she stands.    ?

## 2023-08-08 ENCOUNTER — Encounter: Payer: Self-pay | Admitting: Physician Assistant

## 2023-08-08 ENCOUNTER — Ambulatory Visit: Payer: Medicare Other | Attending: Physician Assistant | Admitting: Cardiology

## 2023-08-08 VITALS — BP 135/74 | HR 66 | Ht 62.0 in | Wt 118.4 lb

## 2023-08-08 DIAGNOSIS — R531 Weakness: Secondary | ICD-10-CM | POA: Diagnosis not present

## 2023-08-08 DIAGNOSIS — R002 Palpitations: Secondary | ICD-10-CM

## 2023-08-08 DIAGNOSIS — R0789 Other chest pain: Secondary | ICD-10-CM

## 2023-08-08 DIAGNOSIS — I10 Essential (primary) hypertension: Secondary | ICD-10-CM | POA: Diagnosis not present

## 2023-08-08 MED ORDER — METOPROLOL TARTRATE 25 MG PO TABS: 25.0000 mg | ORAL_TABLET | Freq: Two times a day (BID) | ORAL | 5 refills | Status: AC

## 2023-08-08 NOTE — Progress Notes (Signed)
 Cardiology Office Note:  .   Date:  08/08/2023  ID:  Michelle Keith, DOB 10-Sep-1931, MRN [id number] PCP: Rodrigo Ran, MD  [facility] Providers Cardiologist:  Rollene Rotunda, MD    History of Present Illness: .   Michelle Keith is a 89 y.o. female hypertension, palpitations.  She has been followed by Dr. Antoine Poche for chest pain previously with normal stress test in 2016.  Also with palpitations but does not tolerate CCB's/BB.  2017 wore heart monitor with no arrhythmias.  Normal echocardiogram August 2023.  At last visit palpitations were not noted and there was no other change in therapy.  BP was noted to be somewhat labile.  Also reported vague symptoms "my heart is tired in the morning".  Not able to articulate symptoms clearly but denied any new symptoms.  She was seen at the ED 07/27/2023 for generalized fatigue and an episode of posterior occiput numbness with spontaneous resolution after 30 minutes.  She also has new diagnosis of cavernoma with recent hemorrhage/edema.  ED provider had discussed with neurosurgeon who felt symptoms were unrelated and scheduled outpatient follow-up.  Yesterday he called our office reporting weakness for the last 2 and 1 and an episode of chest pain.  EMS called who noted some orthostatic hypotension, declined ED visit.  Today patient reports significant decrease in exercise intolerance for the past 3 months and abrupt sort of decline that has been progressive.  She is very functional and actually lives independently in a house by herself able to generally walk 30 to 40 minutes however now has had to reduce this to around 20 minutes due to significant fatigue and weakness.  Also intermittently complains of chest pressure without any accompanying symptoms such as radiation or shortness of breath that has actually been somewhat chronic for the past year plus.  This is less of a concern than her fatigue. Not clearly exertional. She reports eating well at home though  does not get a lot of protein (TID).  Has some weight loss 124 last year now 118, encouraged good feeding/nutrition. She stays hydrated.  Denies any prior smoking history drug use, alcohol.  She does have a mother who had lived to be 81 and also had a heart attack in her mid 44s.    ROS: Denies: shortness of breath, orthopnea, peripheral edema, palpitations, palpitations  + see HPI  Studies Reviewed: Marland Kitchen   EKG shows NSR HR 66. No acute changes.   Risk Assessment/Calculations:             Physical Exam:   VS:  BP 135/74   Pulse 66   Ht 5\' 2"  (1.575 m)   Wt 118 lb 6.4 oz (53.7 kg)   SpO2 93%   BMI 21.66 kg/m    Wt Readings from Last 3 Encounters:  08/08/23 118 lb 6.4 oz (53.7 kg)  07/27/23 113 lb 9.6 oz (51.5 kg)  11/17/22 124 lb (56.2 kg)    GEN: Well nourished, well developed in no acute distress NECK: No JVD; No carotid bruits CARDIAC: RRR, no murmurs, rubs, gallops RESPIRATORY:  Clear to auscultation without rales, wheezing or rhonchi  ABDOMEN: Soft, non-tender, non-distended EXTREMITIES:  No edema; No deformity   ASSESSMENT AND PLAN: .    Weakness/fatigue/chest pressure The differential for her weakness is extensive however seems to have very good overall health status with no significant lab abnormalities.  Im concerned that her current symptoms may represent CAD and are her anginal equivalents She's had  abrupt and progressive decline in functional status.   Will order a Lexiscan to further evaluate this.  PCP has ordered TSH and vitamin D, reportedly.  No signs of anemia.  Also it is possible that she he could not be tolerating her beta-blocker despite being on this chronically.  She is on this for blood pressure and reports not tolerating anything else.  She is on 25 mg in the morning and 50 mg at night.  I asked her to change to 25 mg twice daily for the time being to see if this helps her fatigue.  Can always titrate back at upcoming appointment if this makes no  difference and needs better BP control.  Although weak BP med. She will keep log.  Also says she has allergy to aspirin (diarrhea)   Depending on results still need to take into consideration whether she would be a good candidate with DAPT given her cavernoma.  Not familiar with this diagnoses of bleeding risk associated with this diagnosis but we can address this pending scan results.   Hypertension Known to be previously labile, high in the mornings and normal during the day. Will have her keep log. Reports multiple intolerances. See above about BP, consider changing at next appointment.     Informed Consent   Shared Decision Making/Informed Consent{  The risks [chest pain, shortness of breath, cardiac arrhythmias, dizziness, blood pressure fluctuations, myocardial infarction, stroke/transient ischemic attack, nausea, vomiting, allergic reaction, radiation exposure, metallic taste sensation and life-threatening complications (estimated to be 1 in 10,000)], benefits (risk stratification, diagnosing coronary artery disease, treatment guidance) and alternatives of a cardiac stress test were discussed in detail with [name] and she agrees to proceed.  Cavernoma Seen by neuro who is not concerned.      Dispo: 4 weeks post Lexiscan.   Signed, Abagail Kitchens, PA-C

## 2023-08-08 NOTE — Patient Instructions (Addendum)
 Medication Instructions:  TAKE LOPRESSOR TWICE DAILY *If you need a refill on your cardiac medications before your next appointment, please call your pharmacy*   Lab Work: NO LABS If you have labs (blood work) drawn today and your tests are completely normal, you will receive your results only by: MyChart Message (if you have MyChart) OR A paper copy in the mail If you have any lab test that is abnormal or we need to change your treatment, we will call you to review the results.   Testing/Procedures: Your physician has requested that you have a lexiscan myoview. For further information please visit [email or web address] Please follow instruction sheet, as given.    Follow-Up: At [facility], you and your health needs are our priority.  As part of our continuing mission to provide you with exceptional heart care, we have created designated Provider Care Teams.  These Care Teams include your primary Cardiologist (physician) and Advanced Practice Providers (APPs -  Physician Assistants and Nurse Practitioners) who all work together to provide you with the care you need, when you need it.   Your next appointment:   4 week(s)  Provider:   Azalee Course, PA  Other Instructions Yvonna Alanis, PA  has ordered a Lexiscan Myocardial Perfusion Imaging Study.  Please arrive 15 minutes prior to your appointment time for registration and insurance purposes.   The test will take approximately 3 to 4 hours to complete; you may bring reading material.  If someone comes with you to your appointment, they will need to remain in the main lobby due to limited space in the testing area. **If you are pregnant or breastfeeding, please notify the nuclear lab prior to your appointment**   How to prepare for your Myocardial Perfusion Test: Do not eat or drink 3 hours prior to your test, except you may have water. Do not consume products containing caffeine (regular or decaffeinated) 12 hours prior to your  test. (ex: coffee, chocolate, sodas, tea). Do wear comfortable clothes (no dresses or overalls) and walking shoes, tennis shoes preferred (No heels or open toe shoes are allowed). Do NOT wear cologne, perfume, aftershave, or lotions (deodorant is allowed). If you use an inhaler, use it the AM of your test and bring it with you.  If you use a nebulizer, use it the AM of your test.  If these instructions are not followed, your test will have to be rescheduled.

## 2023-08-10 ENCOUNTER — Telehealth: Payer: Self-pay | Admitting: Cardiology

## 2023-08-10 NOTE — Telephone Encounter (Signed)
 Patient identification verified by 2 forms. Marilynn Rail, RN    Called and spoke to patients daughter Terrilee Files states:   -would like to review information for Lexiscan  RN reviewed:      Rosalita Chessman verbalized understanding, no questions at this time

## 2023-08-10 NOTE — Telephone Encounter (Signed)
 Pt's daughter requesting cb to discuss upcoming appt for stress test

## 2023-08-14 DIAGNOSIS — I10 Essential (primary) hypertension: Secondary | ICD-10-CM | POA: Diagnosis not present

## 2023-08-14 DIAGNOSIS — R82998 Other abnormal findings in urine: Secondary | ICD-10-CM | POA: Diagnosis not present

## 2023-08-22 ENCOUNTER — Telehealth (HOSPITAL_COMMUNITY): Payer: Self-pay

## 2023-08-22 NOTE — Telephone Encounter (Signed)
 Spoke with the patient, given instructions. She will be here for her test. Michelle Keith CCT

## 2023-08-29 ENCOUNTER — Ambulatory Visit (HOSPITAL_COMMUNITY): Payer: Medicare Other | Attending: Cardiology

## 2023-08-29 ENCOUNTER — Other Ambulatory Visit (HOSPITAL_COMMUNITY): Payer: Self-pay | Admitting: Cardiology

## 2023-08-29 VITALS — Ht 62.0 in | Wt 118.0 lb

## 2023-08-29 DIAGNOSIS — R002 Palpitations: Secondary | ICD-10-CM | POA: Diagnosis not present

## 2023-08-29 DIAGNOSIS — I209 Angina pectoris, unspecified: Secondary | ICD-10-CM | POA: Insufficient documentation

## 2023-08-29 DIAGNOSIS — I10 Essential (primary) hypertension: Secondary | ICD-10-CM | POA: Insufficient documentation

## 2023-08-29 DIAGNOSIS — R0789 Other chest pain: Secondary | ICD-10-CM | POA: Diagnosis not present

## 2023-08-29 LAB — MYOCARDIAL PERFUSION IMAGING
LV dias vol: 45 mL (ref 46–106)
LV sys vol: 12 mL
Nuc Stress EF: 74 %
Peak HR: 104 {beats}/min
Rest HR: 59 {beats}/min
Rest Nuclear Isotope Dose: 10.1 mCi
SDS: 1
SRS: 0
SSS: 1
ST Depression (mm): 0 mm
Stress Nuclear Isotope Dose: 30.9 mCi
TID: 0.72

## 2023-08-29 MED ORDER — TECHNETIUM TC 99M TETROFOSMIN IV KIT
30.9000 | PACK | Freq: Once | INTRAVENOUS | Status: AC | PRN
  Administered 2023-08-29: 30.9 via INTRAVENOUS

## 2023-08-29 MED ORDER — TECHNETIUM TC 99M TETROFOSMIN IV KIT
10.1000 | PACK | Freq: Once | INTRAVENOUS | Status: AC | PRN
  Administered 2023-08-29: 10.1 via INTRAVENOUS

## 2023-08-29 MED ORDER — REGADENOSON 0.4 MG/5ML IV SOLN
0.4000 mg | Freq: Once | INTRAVENOUS | Status: AC
  Administered 2023-08-29: 0.4 mg via INTRAVENOUS

## 2023-09-05 ENCOUNTER — Ambulatory Visit: Payer: Medicare Other | Admitting: Physician Assistant

## 2023-09-17 NOTE — Progress Notes (Deleted)
  Cardiology Office Note:   Date:  09/17/2023  ID:  Michelle Keith, DOB 06-09-1932, MRN [id number] PCP: Rodrigo Ran, MD  [facility] Providers Cardiologist:  Rollene Rotunda, MD {  History of Present Illness:   Michelle Keith is a 89 y.o. female ***  ROS: ***  Studies Reviewed:    EKG:       ***  Risk Assessment/Calculations:   {Does this patient have ATRIAL FIBRILLATION?:[phone]} No BP recorded.  {Refresh Note OR Click here to enter BP  :1}***        Physical Exam:   VS:  There were no vitals taken for this visit.   Wt Readings from Last 3 Encounters:  08/29/23 118 lb (53.5 kg)  08/08/23 118 lb 6.4 oz (53.7 kg)  07/27/23 113 lb 9.6 oz (51.5 kg)     GEN: Well nourished, well developed in no acute distress NECK: No JVD; No carotid bruits CARDIAC: ***RR, *** murmurs, rubs, gallops RESPIRATORY:  Clear to auscultation without rales, wheezing or rhonchi  ABDOMEN: Soft, non-tender, non-distended EXTREMITIES:  No edema; No deformity   ASSESSMENT AND PLAN:   ***     Follow up ***  Signed, Rollene Rotunda, MD

## 2023-09-18 ENCOUNTER — Telehealth: Payer: Self-pay | Admitting: Cardiology

## 2023-09-18 ENCOUNTER — Encounter: Payer: Self-pay | Admitting: *Deleted

## 2023-09-18 NOTE — Telephone Encounter (Signed)
 Spoke with pt, aware appointment has been cancelled.

## 2023-09-18 NOTE — Telephone Encounter (Signed)
 Spoke with pt, she reports that she is feeling better than what she did when she was recently seen. Since her nuclear testing was okay can she cancel the appointment 09/21/23. She would like to know what dr hochrein thinks.

## 2023-09-18 NOTE — Telephone Encounter (Signed)
 Pt would like a c/b as to whether she needs to keep upcoming appt being that Stress Test came back okay. Please advise

## 2023-09-19 ENCOUNTER — Ambulatory Visit: Payer: Medicare Other | Admitting: Podiatry

## 2023-09-21 ENCOUNTER — Ambulatory Visit: Payer: Medicare Other | Admitting: Cardiology

## 2023-09-26 ENCOUNTER — Encounter (INDEPENDENT_AMBULATORY_CARE_PROVIDER_SITE_OTHER): Payer: Medicare Other | Admitting: Ophthalmology

## 2023-09-26 DIAGNOSIS — H35372 Puckering of macula, left eye: Secondary | ICD-10-CM

## 2023-09-26 DIAGNOSIS — H353132 Nonexudative age-related macular degeneration, bilateral, intermediate dry stage: Secondary | ICD-10-CM

## 2023-09-26 DIAGNOSIS — H04123 Dry eye syndrome of bilateral lacrimal glands: Secondary | ICD-10-CM

## 2023-09-26 DIAGNOSIS — Z961 Presence of intraocular lens: Secondary | ICD-10-CM

## 2023-10-02 ENCOUNTER — Other Ambulatory Visit (HOSPITAL_COMMUNITY): Payer: Self-pay | Admitting: *Deleted

## 2023-10-03 ENCOUNTER — Ambulatory Visit (HOSPITAL_COMMUNITY)
Admission: RE | Admit: 2023-10-03 | Discharge: 2023-10-03 | Disposition: A | Discharge status: Home / Self Care | Payer: Medicare Other | Source: Ambulatory Visit | Attending: Internal Medicine | Admitting: Internal Medicine

## 2023-10-03 ENCOUNTER — Ambulatory Visit: Payer: Medicare Other | Admitting: Podiatry

## 2023-10-03 DIAGNOSIS — M79675 Pain in left toe(s): Secondary | ICD-10-CM | POA: Diagnosis not present

## 2023-10-03 DIAGNOSIS — R609 Edema, unspecified: Secondary | ICD-10-CM | POA: Diagnosis not present

## 2023-10-03 DIAGNOSIS — B351 Tinea unguium: Secondary | ICD-10-CM | POA: Diagnosis not present

## 2023-10-03 DIAGNOSIS — M810 Age-related osteoporosis without current pathological fracture: Secondary | ICD-10-CM | POA: Insufficient documentation

## 2023-10-03 DIAGNOSIS — M79674 Pain in right toe(s): Secondary | ICD-10-CM

## 2023-10-03 MED ORDER — DENOSUMAB 60 MG/ML ~~LOC~~ SOSY
60.0000 mg | PREFILLED_SYRINGE | Freq: Once | SUBCUTANEOUS | Status: AC
  Administered 2023-10-03: 60 mg via SUBCUTANEOUS

## 2023-10-03 MED ORDER — DENOSUMAB 60 MG/ML ~~LOC~~ SOSY
PREFILLED_SYRINGE | SUBCUTANEOUS | Status: AC
  Filled 2023-10-03: qty 1

## 2023-10-03 NOTE — Progress Notes (Unsigned)
 Subjective: Chief Complaint  Patient presents with   RFC    RM#33 RFC    89 year old female presents for the above concerns.  States that she has been doing well.  She is only been using tea tree oil on the toenails which has been helping of the nail still get thick and they cause discomfort particularly getting inside shoes.  Over the last couple days she been having increased pain to her right calf as well as tenderness.  No recent treatment or injuries.  She denies any chest pain or shortness of breath.  Objective: AAO x3, NAD DP/PT pulses palpable bilaterally, CRT less than 3 seconds Nails are hypertrophic, dystrophic, brittle, discolored, elongated 10. No surrounding redness or drainage. Tenderness nails 1-5 bilaterally. No open lesions or pre-ulcerative lesions are identified today. There is some dry skin present on the foot mostly on the heel. Swelling present to right calf compared to left tenderness palpation with calf compression.  There is no erythema or warmth.  Assessment: Symptomatic onychomycosis; right leg swelling  Plan: Symptomatic onychomycosis -Sharply debrided nails x 10 without any complications or bleeding.  She is continue with tea tree oil to the nail.  -Moisturizer for the feet daily.  -Daily foot inspection.   Right leg swelling -Order venous duplex to rule out DVT given acute swelling and pain to her right leg.  Not able to get scheduled till tomorrow but if symptoms worsen or changes overnight to report to the emergency room.  Verbalized understanding.   Charity Conch DPM

## 2023-10-03 NOTE — Progress Notes (Signed)
 [facility] - Clinic Note  10/17/2023     CHIEF COMPLAINT Patient presents for Retina Follow Up   HISTORY OF PRESENT ILLNESS: Michelle Keith is a 89 y.o. female who presents to the clinic today for:  HPI     Retina Follow Up   Patient presents with  Dry AMD.  In both eyes.  This started 7 months ago.  I, the attending physician,  performed the HPI with the patient and updated documentation appropriately.        Comments   Patient here for 7 months retina follow up for non exu ARMD OU. Patient states vision ok with driving glasses on. Had a brain bleed and blood clots in right leg. Using AT TID OU.      Last edited by Roshni Burbano, MD on 10/18/2023  9:09 AM.    Patient states since she was here last, she developed a brain bleed and blood clots in her legs, she states she is now on Eliquis  for the blood clots, but her drs really didn't want to give it to her since she had the brain bleed in February, she goes back to the hematologist on May 8th, she doesn't think she is seeing overall as well as she used to  Referring physician: Aldo Hun, MD [address] [place],  [place] [address]  HISTORICAL INFORMATION:   Selected notes from the MEDICAL RECORD NUMBER Referred by Dr. Danley Dusky for evaluation of AMD OU   CURRENT MEDICATIONS: No current outpatient medications on file. (Ophthalmic Drugs)   No current facility-administered medications for this visit. (Ophthalmic Drugs)   Current Outpatient Medications (Other)  Medication Sig   apixaban  (ELIQUIS ) 5 MG TABS tablet Take 1 tablet (5 mg total) by mouth 2 (two) times daily.   Calcium-Vitamin D -Vitamin K (VIACTIV PO) Take 1 tablet by mouth 2 (two) times daily.   Cholecalciferol  (VITAMIN D ) 50 MCG (2000 UT) tablet Take 2,000 Units by mouth daily.   ciclopirox  (PENLAC ) 8 % solution Apply topically at bedtime. Apply over nail and surrounding skin. Apply daily over previous coat. After seven (7) days, may remove  with alcohol and continue cycle.   denosumab  (PROLIA ) 60 MG/ML SOSY injection Inject 60 mg into the skin once. 2 times a year   metoprolol  tartrate (LOPRESSOR ) 25 MG tablet Take 1 tablet (25 mg total) by mouth 2 (two) times daily.   Multiple Vitamins-Minerals (PRESERVISION AREDS PO) Take by mouth.   pantoprazole  (PROTONIX ) 40 MG tablet Take 40 mg by mouth every other day. Patient states she takes it every other day prn   valACYclovir (VALTREX) 500 MG tablet Take 500 mg by mouth as needed (fever blisters).   zolpidem  (AMBIEN ) 10 MG tablet Take 2.5 mg by mouth daily as needed for sleep.    No current facility-administered medications for this visit. (Other)   REVIEW OF SYSTEMS: ROS   Positive for: Gastrointestinal, Musculoskeletal, Cardiovascular, Eyes Negative for: Constitutional, Neurological, Skin, Genitourinary, HENT, Endocrine, Respiratory, Psychiatric, Allergic/Imm, Heme/Lymph Last edited by Sylvan Evener, COA on 10/17/2023  1:17 PM.     ALLERGIES Allergies  Allergen Reactions   Epinephrine Anaphylaxis   Lidocaine Anaphylaxis   Aspirin    Atenolol    Clarithromycin    Cortisone Other (See Comments)   Hydrocodone Nausea And Vomiting   Norvasc  [Amlodipine ] Other (See Comments)    headaches   Omeprazole Magnesium Other (See Comments)    Ask at office visit   Penicillins  Pindolol  Diarrhea    Fatigue, hip pain   Pseudoephedrine-Guaifenesin Other (See Comments)   Sulfonamide Derivatives    Tetracycline Hives   Xifaxan  [Rifaximin ]    Glycol Stearate Palpitations   Tramadol  Palpitations   Valsartan Palpitations   PAST MEDICAL HISTORY Past Medical History:  Diagnosis Date   Dysautonomia (HCC)    Esophageal stricture    GERD (gastroesophageal reflux disease)    Hiatal hernia    Hypertension    IBS (irritable bowel syndrome)    Macular degeneration    Dry OU   Osteopenia    Osteoporosis    Right thyroid  nodule    Squamous cell skin cancer    Syncope    Past  Surgical History:  Procedure Laterality Date   C-EYE SURGERY PROCEDURE     30 yrs ago   Cataract extraction     CATARACT EXTRACTION Bilateral 2001   Dr. Lasandra Points   COLONOSCOPY  2009   Also 2002   ESOPHAGEAL DILATION     x 4   ESOPHAGEAL MANOMETRY     ESOPHAGOGASTRODUODENOSCOPY  Multiple   Exploratory laparoscopy     EYE SURGERY Bilateral    Cat Sx - Dr. Dayton Evener FUNDOPLICATION     TOTAL ABDOMINAL HYSTERECTOMY     FAMILY HISTORY Family History  Problem Relation Age of Onset   Multiple myeloma Father        died age 9   Heart failure Mother        died age 26   SOCIAL HISTORY Social History   Tobacco Use   Smoking status: Never   Smokeless tobacco: Never  Vaping Use   Vaping status: Never Used  Substance Use Topics   Alcohol use: No    Alcohol/week: 0.0 standard drinks of alcohol   Drug use: No       OPHTHALMIC EXAM:  Base Eye Exam     Visual Acuity (Snellen - Linear)       Right Left   Dist Elliott 20/20 -1 20/40 -2         Tonometry (Tonopen, 1:15 PM)       Right Left   Pressure 15 14         Pupils       Dark Light Shape React APD   Right 3 2 Round Brisk None   Left 3 2 Round Brisk None         Visual Fields (Counting fingers)       Left Right    Full Full         Extraocular Movement       Right Left    Full, Ortho Full, Ortho         Neuro/Psych     Oriented x3: Yes   Mood/Affect: Normal         Dilation     Both eyes: 1.0% Mydriacyl, 2.5% Phenylephrine @ 1:15 PM           Slit Lamp and Fundus Exam     External Exam       Right Left   External Normal Normal         Slit Lamp Exam       Right Left   Lids/Lashes Dermatochalasis - upper lid Dermatochalasis - upper lid, mild MGD   Conjunctiva/Sclera White and quiet White and quiet   Cornea arcus, 1+ peripheral Punctate epithelial erosions, tear film debris Arcus; 1-2+inferior Punctate epithelial erosions, +tear film debris, EBMD, peripheral  corneal  haze   Anterior Chamber deep and clear deep and clear   Iris Round, dilated Round, dilated   Lens PCIOL; open PC PCIOL; 1+ PCO non central   Anterior Vitreous mild syneresis, PVD, vitreous condensations Mild syneresis, PVD, vitreous condensations         Fundus Exam       Right Left   Disc mild Pallor, Sharp rim, Compact mild Pallor, Sharp rim, Compact   C/D Ratio 0.1 0.1   Macula Flat, Blunted foveal reflex, RPE mottling and clumping; drusen; no heme or edema flat, Blunted foveal reflex, RPE mottling and clumping, ERM with stable central thickening; drusen; no heme or edema   Vessels attenuated, mild tortuosity attenuated, mild tortuosity   Periphery Attached, paving stone at 0700, mild Reticular degeneration, No heme attached, No heme           Refraction     Wearing Rx       Sphere Cylinder Axis Add   Right +1.00 +0.50 146 +2.75   Left -1.25 +0.75 003 +2.75           IMAGING AND PROCEDURES  Imaging and Procedures for 08/29/17  OCT, Retina - OU - Both Eyes       Right Eye Quality was good. Central Foveal Thickness: 356. Progression has improved. Findings include normal foveal contour, no IRF, no SRF, retinal drusen , pigment epithelial detachment (Persistent drusen - stable from prior, blunted foveal contour, interval improvement in mild pucker nasal mac).   Left Eye Quality was good. Central Foveal Thickness: 428. Progression has been stable. Findings include no IRF, no SRF, abnormal foveal contour, retinal drusen , epiretinal membrane (ERM with central retinal thickening -- stable).   Notes Images taken, stored on drive  Diagnosis / Impression:   nonexudative ARMD OU -- stable drusen; blunted foveal contour; interval improvement in mild pucker nasal mac ERM OS -- mild -- stable from prior  Clinical management:  See below  Abbreviations: NFP - Normal foveal profile. CME - cystoid macular edema. PED - pigment epithelial detachment. IRF - intraretinal fluid.  SRF - subretinal fluid. EZ - ellipsoid zone. ERM - epiretinal membrane. ORA - outer retinal atrophy. ORT - outer retinal tubulation. SRHM - subretinal hyper-reflective material             ASSESSMENT/PLAN:    ICD-10-CM   1. Intermediate stage nonexudative age-related macular degeneration of both eyes  H35.3132 OCT, Retina - OU - Both Eyes    2. Epiretinal membrane (ERM) of left eye  H35.372     3. Pseudophakia of both eyes  Z96.1     4. Dry eyes  H04.123      1. Age related macular degeneration, non-exudative, both eyes  - mild-intermediate stage OU -- stable drusen on OCT  - The incidence, anatomy, and pathology of dry AMD, risk of progression, and the AREDS and AREDS 2 study including smoking risks discussed with patient.  - continue amsler grid monitoring  - recommend f/u 9 months, sooner prn -- DFE/OCT  2. Epiretinal membrane, left eye   - very mild ERM, BCVA 20/40 with no metamorphopsia OS  - OCT shows interval improvement in mild pucker nasal mac  - no surgical intervention indicated at this time  - will continue to monitor  - recommend f/u 9 months, sooner prn -- DFE/OCT  3. Pseudophakia OU  - s/p CE/IOL OU w/ Dr. Lasandra Points  - beautiful surgeries, doing well  - continue to monitor  4. Dry Eyes OU  - exam shows persistent PEE (1-2+), ABMD, decreased TBUT and tear film debris  - recommend hot compresses OU BID  - recommend artificial tears and lubricating ointment as needed. Patient was instructed to use Thera tears three times a day.   Ophthalmic Meds Ordered this visit:  No orders of the defined types were placed in this encounter.    Return in about 9 months (around 07/18/2024) for nonex ARMD OU, Dilated Exam, OCT.  There are no Patient Instructions on file for this visit.  This document serves as a record of services personally performed by Jeanice Millard, MD, PhD. It was created on their behalf by Arn Lane, COT an ophthalmic technician. The creation  of this record is the provider's dictation and/or activities during the visit.    Electronically signed by:  Olene Berne, COT  10/18/23 9:17 AM  This document serves as a record of services personally performed by Jeanice Millard, MD, PhD. It was created on their behalf by Morley Arabia. Bevin Bucks, OA an ophthalmic technician. The creation of this record is the provider's dictation and/or activities during the visit.    Electronically signed by: Morley Arabia. Bevin Bucks, OA 10/18/23 9:17 AM   Jeanice Millard, M.D., Ph.D. Diseases & Surgery of the Retina and Vitreous [facility]  I have reviewed the above documentation for accuracy and completeness, and I agree with the above. Jeanice Millard, M.D., Ph.D. 10/18/23 9:18 AM    Abbreviations: M myopia (nearsighted); A astigmatism; H hyperopia (farsighted); P presbyopia; Mrx spectacle prescription;  CTL contact lenses; OD right eye; OS left eye; OU both eyes  XT exotropia; ET esotropia; PEK punctate epithelial keratitis; PEE punctate epithelial erosions; DES dry eye syndrome; MGD meibomian gland dysfunction; ATs artificial tears; PFAT's preservative free artificial tears; NSC nuclear sclerotic cataract; PSC posterior subcapsular cataract; ERM epi-retinal membrane; PVD posterior vitreous detachment; RD retinal detachment; DM diabetes mellitus; DR diabetic retinopathy; NPDR non-proliferative diabetic retinopathy; PDR proliferative diabetic retinopathy; CSME clinically significant macular edema; DME diabetic macular edema; dbh dot blot hemorrhages; CWS cotton wool spot; POAG primary open angle glaucoma; C/D cup-to-disc ratio; HVF humphrey visual field; GVF goldmann visual field; OCT optical coherence tomography; IOP intraocular pressure; BRVO Branch retinal vein occlusion; CRVO central retinal vein occlusion; CRAO central retinal artery occlusion; BRAO branch retinal artery occlusion; RT retinal tear; SB scleral buckle; PPV pars plana  vitrectomy; VH Vitreous hemorrhage; PRP panretinal laser photocoagulation; IVK intravitreal kenalog; VMT vitreomacular traction; MH Macular hole;  NVD neovascularization of the disc; NVE neovascularization elsewhere; AREDS age related eye disease study; ARMD age related macular degeneration; POAG primary open angle glaucoma; EBMD epithelial/anterior basement membrane dystrophy; ACIOL anterior chamber intraocular lens; IOL intraocular lens; PCIOL posterior chamber intraocular lens; Phaco/IOL phacoemulsification with intraocular lens placement; PRK photorefractive keratectomy; LASIK laser assisted in situ keratomileusis; HTN hypertension; DM diabetes mellitus; COPD chronic obstructive pulmonary disease

## 2023-10-04 ENCOUNTER — Ambulatory Visit (HOSPITAL_COMMUNITY)
Admission: RE | Admit: 2023-10-04 | Discharge: 2023-10-04 | Disposition: A | Discharge status: Home / Self Care | Source: Ambulatory Visit | Attending: Cardiology | Admitting: Cardiology

## 2023-10-04 ENCOUNTER — Encounter (HOSPITAL_COMMUNITY): Payer: Self-pay

## 2023-10-04 ENCOUNTER — Ambulatory Visit (HOSPITAL_BASED_OUTPATIENT_CLINIC_OR_DEPARTMENT_OTHER)
Admission: RE | Admit: 2023-10-04 | Discharge: 2023-10-04 | Disposition: A | Discharge status: Home / Self Care | Source: Ambulatory Visit | Attending: Vascular Surgery | Admitting: Vascular Surgery

## 2023-10-04 ENCOUNTER — Other Ambulatory Visit (HOSPITAL_COMMUNITY): Payer: Self-pay

## 2023-10-04 VITALS — BP 199/73 | HR 60

## 2023-10-04 DIAGNOSIS — I82441 Acute embolism and thrombosis of right tibial vein: Secondary | ICD-10-CM | POA: Insufficient documentation

## 2023-10-04 DIAGNOSIS — Z86718 Personal history of other venous thrombosis and embolism: Secondary | ICD-10-CM | POA: Insufficient documentation

## 2023-10-04 DIAGNOSIS — R609 Edema, unspecified: Secondary | ICD-10-CM | POA: Diagnosis not present

## 2023-10-04 HISTORY — DX: Personal history of other venous thrombosis and embolism: Z86.718

## 2023-10-04 MED ORDER — APIXABAN 5 MG PO TABS
5.0000 mg | ORAL_TABLET | Freq: Two times a day (BID) | ORAL | 0 refills | Status: DC
  Filled 2023-10-04: qty 60, 30d supply, fill #0

## 2023-10-04 MED ORDER — APIXABAN 5 MG PO TABS: 5.0000 mg | ORAL_TABLET | Freq: Two times a day (BID) | ORAL | 4 refills | Status: DC

## 2023-10-04 NOTE — Progress Notes (Addendum)
 DVT Clinic Note  Name: Michelle Keith     MRN: [id number]     DOB: 06-08-1932     Sex: female  PCP: Aldo Hun, MD  Today's Visit: Visit Information: Initial Visit  Referred to DVT Clinic by:  Podiatry  - Dr. Clydia Dart Referred to CPP by: Dr. Rosalva Comber Reason for referral:  Chief Complaint  Patient presents with   DVT   HISTORY OF PRESENT ILLNESS: Michelle Keith is a 89 y.o. female with PMH HTN, osteoporosis, GERD, who presents after diagnosis of DVT for medication management. Patient was seen yesterday at podiatry reporting new pain and swelling in her right leg, extending from her ankle to behind her knee. DVT study today showed acute DVT in the right posterior tibial and peroneal veins. Patient is ambulating well independently today and is accompanied by her daughter Ottie Blonder. Reports the pain and swelling started last week. It started in her ankle and extends to the back of her knee and is more noticeable when walking. Denies any recent injury, illness, or immobility. Denies prior history of DVT or PE. No chest pain or SOB today. Patient reports unintentional weight loss of about 7 lbs (>5% body weight) around December/January. She increased her eating and it has come up some. Around that time she was also experiencing weakness and fatigue, with no clear cause found, but reports this has improved in the last few months.   Positive Thrombotic Risk Factors: Older Age Bleeding Risk Factors: Age >65 years  Negative Thrombotic Risk Factors: Previous VTE, Recent surgery (within 3 months), Recent trauma (within 3 months), Recent admission to hospital with acute illness (within 3 months), Paralysis, paresis, or recent plaster cast immobilization of lower extremity, Central venous catheterization, Bed rest >72 hours within 3 months, Sedentary journey lasting >8 hours within 4 weeks, Pregnancy, Within 6 weeks postpartum, Recent cesarean section (within 3 months), Estrogen therapy, Testosterone therapy,  Erythropoiesis-stimulating agent, Recent COVID diagnosis (within 3 months), Active cancer, Non-malignant, chronic inflammatory condition, Known thrombophilic condition, Smoking, Obesity  Rx Insurance Coverage: Medicare Rx Affordability: Patient has ~$400 deductible remaining before her copays kick in. Used one time free trial card for this first fill today.  Rx Assistance Provided: Free 30-day trial card Preferred Pharmacy: Filled first month's supply at on-site [facility] pharmacy. Refills sent to patient's preferred pharmacy.   Past Medical History:  Diagnosis Date   Dysautonomia (HCC)    Esophageal stricture    GERD (gastroesophageal reflux disease)    Hiatal hernia    Hypertension    IBS (irritable bowel syndrome)    Macular degeneration    Dry OU   Osteopenia    Osteoporosis    Right thyroid nodule    Squamous cell skin cancer    Syncope     Past Surgical History:  Procedure Laterality Date   C-EYE SURGERY PROCEDURE     30 yrs ago   Cataract extraction     CATARACT EXTRACTION Bilateral 2001   Dr. Lasandra Points   COLONOSCOPY  2009   Also 2002   ESOPHAGEAL DILATION     x 4   ESOPHAGEAL MANOMETRY     ESOPHAGOGASTRODUODENOSCOPY  Multiple   Exploratory laparoscopy     EYE SURGERY Bilateral    Cat Sx - Dr. Dayton Evener FUNDOPLICATION     TOTAL ABDOMINAL HYSTERECTOMY      Social History   Socioeconomic History   Marital status: Widowed    Spouse name: Not on file   Number of  children: 2   Years of education: Not on file   Highest education level: Not on file  Occupational History   Occupation: Retired  Tobacco Use   Smoking status: Never   Smokeless tobacco: Never  Vaping Use   Vaping status: Never Used  Substance and Sexual Activity   Alcohol use: No    Alcohol/week: 0.0 standard drinks of alcohol   Drug use: No   Sexual activity: Not Currently    Partners: Male    Comment: WIDOWED  Other Topics Concern   Not on file  Social History Narrative   Lives alone.   Husband in a nursing home after a stroke.        1 son one daughter   Social Drivers of Corporate investment banker Strain: Not on file  Food Insecurity: Not on file  Transportation Needs: Not on file  Physical Activity: Not on file  Stress: Not on file  Social Connections: Not on file  Intimate Partner Violence: Not on file    Family History  Problem Relation Age of Onset   Multiple myeloma Father        died age 18   Heart failure Mother        died age 76    Allergies as of 10/04/2023 - Review Complete 10/04/2023  Allergen Reaction Noted   Epinephrine Anaphylaxis    Lidocaine Anaphylaxis    Aspirin     Atenolol  06/23/2016   Clarithromycin     Cortisone Other (See Comments)    Hydrocodone Nausea And Vomiting 07/27/2023   Norvasc [amlodipine] Other (See Comments) 03/18/2022   Omeprazole magnesium Other (See Comments) 07/26/2015   Penicillins     Pindolol Diarrhea 04/07/2016   Pseudoephedrine-guaifenesin Other (See Comments)    Sulfonamide derivatives     Tetracycline Hives    Xifaxan [rifaximin]  10/04/2023   Glycol stearate Palpitations 11/22/2013   Tramadol Palpitations 07/27/2023   Valsartan Palpitations 07/14/2015    Current Outpatient Medications on File Prior to Encounter  Medication Sig Dispense Refill   Calcium-Vitamin D-Vitamin K (VIACTIV PO) Take 1 tablet by mouth 2 (two) times daily.     Cholecalciferol (VITAMIN D) 50 MCG (2000 UT) tablet Take 2,000 Units by mouth daily.     denosumab (PROLIA) 60 MG/ML SOSY injection Inject 60 mg into the skin once. 2 times a year     metoprolol tartrate (LOPRESSOR) 25 MG tablet Take 1 tablet (25 mg total) by mouth 2 (two) times daily. 60 tablet 5   Multiple Vitamins-Minerals (PRESERVISION AREDS PO) Take by mouth.     pantoprazole (PROTONIX) 40 MG tablet Take 40 mg by mouth every other day. Patient states she takes it every other day prn     zolpidem (AMBIEN) 10 MG tablet Take 2.5 mg by mouth daily as needed for sleep.       ciclopirox (PENLAC) 8 % solution Apply topically at bedtime. Apply over nail and surrounding skin. Apply daily over previous coat. After seven (7) days, may remove with alcohol and continue cycle. 6.6 mL 2   valACYclovir (VALTREX) 500 MG tablet Take 500 mg by mouth as needed (fever blisters).     No current facility-administered medications on file prior to encounter.   REVIEW OF SYSTEMS:  Review of Systems  Respiratory:  Negative for shortness of breath.   Cardiovascular:  Positive for leg swelling. Negative for chest pain and palpitations.  Musculoskeletal:  Positive for myalgias.  Neurological:  Negative for dizziness and  tingling.   PHYSICAL EXAMINATION:  Vitals:   10/04/23 1351 10/04/23 1423  BP: (!) 202/73 (!) 199/73  Pulse: 60   SpO2: 100%    Physical Exam Vitals reviewed.  Cardiovascular:     Rate and Rhythm: Normal rate.  Pulmonary:     Effort: Pulmonary effort is normal.  Musculoskeletal:        General: Tenderness present.     Right lower leg: Edema present.     Left lower leg: No edema.  Psychiatric:        Mood and Affect: Mood normal.        Behavior: Behavior normal.        Thought Content: Thought content normal.    Villalta Score for Post-Thrombotic Syndrome: Pain: Moderate Cramps: Absent Heaviness: Mild Paresthesia: Absent Pruritus: Absent Pretibial Edema: Mild Skin Induration: Absent Hyperpigmentation: Absent Redness: Absent Venous Ectasia: Absent Pain on calf compression: Mild Villalta Preliminary Score: 5 Is venous ulcer present?: No If venous ulcer is present and score is <15, then 15 points total are assigned: Absent Villalta Total Score: 5  LABS:  CBC     Component Value Date/Time   WBC 6.5 07/27/2023 1002   RBC 4.10 07/27/2023 1002   HGB 12.8 07/27/2023 1002   HGB 13.4 07/20/2017 1447   HCT 39.7 07/27/2023 1002   HCT 40.1 07/20/2017 1447   PLT 310 07/27/2023 1002   PLT 356 07/20/2017 1447   MCV 96.8 07/27/2023 1002   MCV 91  07/20/2017 1447   MCH 31.2 07/27/2023 1002   MCHC 32.2 07/27/2023 1002   RDW 13.5 07/27/2023 1002   RDW 13.6 07/20/2017 1447   LYMPHSABS 1.0 02/14/2021 1816   LYMPHSABS 1.4 07/20/2017 1447   MONOABS 0.7 02/14/2021 1816   EOSABS 0.1 02/14/2021 1816   EOSABS 0.2 07/20/2017 1447   BASOSABS 0.0 02/14/2021 1816   BASOSABS 0.0 07/20/2017 1447    Hepatic Function      Component Value Date/Time   PROT 6.5 02/14/2021 1816   ALBUMIN 3.8 02/14/2021 1816   AST 16 02/14/2021 1816   ALT 14 02/14/2021 1816   ALKPHOS 52 02/14/2021 1816   BILITOT 0.8 02/14/2021 1816   BILIDIR 0.1 08/09/2007 0505   IBILI 0.7 08/09/2007 0505    Renal Function   Lab Results  Component Value Date   CREATININE 0.79 07/27/2023   CREATININE 0.80 01/10/2022   CREATININE 0.82 01/10/2022    CrCl cannot be calculated (Patient's most recent lab result is older than the maximum 21 days allowed.).   VVS Vascular Lab Studies:  10/04/23 VAS [place] LOWER EXTREMITY VENOUS (DVT)RIGHT Summary:  RIGHT:  - Findings consistent with acute deep vein thrombosis involving the right  posterior tibial veins, and right peroneal veins.  - There is a heterogenous area of the right popliteal fossa with  peripheral and internal low resistant vascularity, measuring 5.3 x 1.6 x  2.7cm. Suggestive of possible ganglion cyst versus mass versus unknown  etiology. Further imaging may be warranted if   clinically indicated.   LEFT:  - No evidence of common femoral vein obstruction.   ASSESSMENT: Location of DVT: Right distal vein Cause of DVT: unprovoked  Patient without prior history of VTE diagnosed with an acute DVT in her right posterior tibial and peroneal veins and is indicated to start anticoagulation with a DOAC. Importantly, patient was seen in our ED 07/27/23 for generalized weakness, fatigue, and unintentional weight loss for 1 month. On CT head and MRI, an 8 mm cavernoma  was found and felt to be a hemorrhage but hemorrhagic  metastasis was difficult to definitively exclude. She was evaluated by neurosurgery at the time who felt this was a cavernous angioma. This was felt it would resolve quickly and did not require follow up. She was evaluated by cardiology and no cardiac causes were found for her weakness and fatigue.   In light of this finding just two months ago, discussed starting anticoagulation with Dr. Karin Lieu who recommended speaking with neuro to determine if anticoagulation is safe for this patient. Spoke with Dr. Roda Shutters, who reviewed the patient's images and felt that she had a small cavernoma hemorrhage that should be resolved by now. It seemed less likely at the time that this was related to malignancy but now in light of an unprovoked DVT, she may benefit from malignancy workup, particularly with her recent symptoms of weakness, fatigue, and unintentional weight loss. Will refer her to hematology for further workup of unprovoked DVT and to rule out malignancy. Unclear what to make of the area in the popliteal fossa noted on her ultrasound today that was "suggestive of possible ganglion cyst versus mass versus unknown etiology. Further imaging may be warranted if clinically indicated." Dr. Roda Shutters thinks anticoagulation would be safe for her now as no other contraindications are present. Recommends starting anticoagulation with Eliquis but not with the loading dose for VTE. Will therefore initiate Eliquis 5 mg BID. Appreciate Dr. Warren Danes assistance, and Dr. Karin Lieu is in agreement with this plan. Further management per heme/onc. Patient has an extensive history of allergies and intolerances to medications. Counseled her extensively on Eliquis, and all questions have been answered.   Of note, patient's BP is significantly elevated today even on recheck. Throughout the visit the patient endorsed feeling very anxious and flustered about the DVT diagnosis and attributes her BP to this. She is also due for a dose of metoprolol which she  takes for BP. She denies any concerning symptoms such as headache, chest pain, dizziness. She will take her medicine when she gets home and rest and recheck her BP. If elevated she is aware to call her PCP or cardiologist, or if warning symptoms appear to seek emergent medical attention.   PLAN: -Start apixaban (Eliquis) 5 mg twice daily. -Expected duration of therapy: per hematology. Therapy started on 10/04/23. -Patient educated on purpose, proper use and potential adverse effects of apixaban (Eliquis). -Discussed importance of taking medication around the same time every day. -Advised patient of medications to avoid (NSAIDs, aspirin doses >100 mg daily). -Educated that Tylenol (acetaminophen) is the preferred analgesic to lower the risk of bleeding. -Advised patient to alert all providers of anticoagulation therapy prior to starting a new medication or having a procedure. -Emphasized importance of monitoring for signs and symptoms of bleeding (abnormal bruising, prolonged bleeding, nose bleeds, bleeding from gums, discolored urine, black tarry stools). -Educated patient to present to the ED if emergent signs and symptoms of new thrombosis occur. -Counseled patient to wear compression stockings daily, removing at night. Elevate legs to help improve swelling.   Follow up: Referred to hematology/oncology. DVT Clinic available as needed.   Pervis Hocking, PharmD, [place], CPP Deep Vein Thrombosis Clinic Clinical Pharmacist Practitioner  I have evaluated the patient's chart/imaging and refer this patient to the Clinical Pharmacist Practitioner for medication management. I have reviewed the CPP's documentation and agree with her assessment and plan. I was immediately available during the visit for questions and collaboration.   Victorino Sparrow, MD

## 2023-10-04 NOTE — Patient Instructions (Signed)
-  Start apixaban (Eliquis) 5 mg twice daily. -Your refills have been sent to your [facility]. You may need to call the pharmacy to ask them to fill this when you start to run low on your current supply.  -It is important to take your medication around the same time every day.  -Avoid NSAIDs like ibuprofen (Advil, Motrin) and naproxen (Aleve) as well as aspirin doses over 100 mg daily. -Tylenol (acetaminophen) is the preferred over the counter pain medication to lower the risk of bleeding. -Be sure to alert all of your health care providers that you are taking an anticoagulant prior to starting a new medication or having a procedure. -Monitor for signs and symptoms of bleeding (abnormal bruising, prolonged bleeding, nose bleeds, bleeding from gums, discolored urine, black tarry stools). If you have fallen and hit your head OR if your bleeding is severe or not stopping, seek emergency care.  -Go to the emergency room if emergent signs and symptoms of new clot occur (new or worse swelling and pain in an arm or leg, shortness of breath, chest pain, fast or irregular heartbeats, lightheadedness, dizziness, fainting, coughing up blood) or if you experience a significant color change (pale or blue) in the extremity that has the DVT.  -We recommend you wear compression stockings (15-20 mmHg) as long as you are having swelling or pain. Be sure to purchase the correct size and take them off at night.   If you have any questions or need to reschedule an appointment, please call [phone] [facility] (active number until 10/16/23; after that time it will be [phone]).  If you are having an emergency, call 911 or present to the nearest emergency room.   What is a DVT?  -Deep vein thrombosis (DVT) is a condition in which a blood clot forms in a vein of the deep venous system which can occur in the lower leg, thigh, pelvis, arm, or neck. This condition is serious and can be life-threatening if the clot travels to the  arteries of the lungs and causing a blockage (pulmonary embolism, PE). A DVT can also damage veins in the leg, which can lead to long-term venous disease, leg pain, swelling, discoloration, and ulcers or sores (post-thrombotic syndrome).  -Treatment may include taking an anticoagulant medication to prevent more clots from forming and the current clot from growing, wearing compression stockings, and/or surgical procedures to remove or dissolve the clot.

## 2023-10-10 ENCOUNTER — Encounter (INDEPENDENT_AMBULATORY_CARE_PROVIDER_SITE_OTHER): Admitting: Ophthalmology

## 2023-10-10 ENCOUNTER — Telehealth: Payer: Self-pay | Admitting: Podiatry

## 2023-10-10 NOTE — Telephone Encounter (Signed)
 She had ultrasound done for blood clots and stated she does have 2 blood clots. She would like to know when you want to see her back.

## 2023-10-10 NOTE — Telephone Encounter (Addendum)
 Spoke with the patient with her questions about wanting a new ultrasound to see if the blood clots were still present. Will defer to her PCP or Hematology/Oncology. I called Dr. Tempie Fee office and left VM with Dr. Tempie Fee office to make sure they knew what was going on.   --  Spoke with Dr. Genelle Kennedy on 4/22. He is going to get her in to be seen. Informed him of the duplex results and possible mass.

## 2023-10-10 NOTE — Telephone Encounter (Signed)
 Patient called and left message about the blood clots. She states that she is on a low dose blood thinner. She would like to know when you think she should over this and would like to schedule another scan to make sure they are gone. She also would like to thank you for checking her leg.

## 2023-10-12 DIAGNOSIS — I82441 Acute embolism and thrombosis of right tibial vein: Secondary | ICD-10-CM | POA: Diagnosis not present

## 2023-10-17 ENCOUNTER — Encounter (INDEPENDENT_AMBULATORY_CARE_PROVIDER_SITE_OTHER): Payer: Self-pay | Admitting: Ophthalmology

## 2023-10-17 ENCOUNTER — Other Ambulatory Visit: Payer: Self-pay | Admitting: Nurse Practitioner

## 2023-10-17 ENCOUNTER — Ambulatory Visit (INDEPENDENT_AMBULATORY_CARE_PROVIDER_SITE_OTHER): Admitting: Ophthalmology

## 2023-10-17 DIAGNOSIS — H35372 Puckering of macula, left eye: Secondary | ICD-10-CM | POA: Diagnosis not present

## 2023-10-17 DIAGNOSIS — Z961 Presence of intraocular lens: Secondary | ICD-10-CM

## 2023-10-17 DIAGNOSIS — H04123 Dry eye syndrome of bilateral lacrimal glands: Secondary | ICD-10-CM | POA: Diagnosis not present

## 2023-10-17 DIAGNOSIS — H353132 Nonexudative age-related macular degeneration, bilateral, intermediate dry stage: Secondary | ICD-10-CM | POA: Diagnosis not present

## 2023-10-17 DIAGNOSIS — Q283 Other malformations of cerebral vessels: Secondary | ICD-10-CM

## 2023-10-18 ENCOUNTER — Other Ambulatory Visit: Payer: Self-pay | Admitting: Internal Medicine

## 2023-10-18 ENCOUNTER — Encounter (INDEPENDENT_AMBULATORY_CARE_PROVIDER_SITE_OTHER): Payer: Self-pay | Admitting: Ophthalmology

## 2023-10-18 DIAGNOSIS — R2241 Localized swelling, mass and lump, right lower limb: Secondary | ICD-10-CM

## 2023-10-24 DIAGNOSIS — K080 Exfoliation of teeth due to systemic causes: Secondary | ICD-10-CM | POA: Diagnosis not present

## 2023-10-25 ENCOUNTER — Telehealth: Payer: Self-pay

## 2023-10-25 NOTE — Telephone Encounter (Signed)
 LVM with pt to remind her of her appt tomorrow with Dr. Randye Buttner at 11:00am. Also called her daughter, who is on her contact list to share same information on her VM as well. Provided a call-back number.

## 2023-10-26 ENCOUNTER — Inpatient Hospital Stay: Attending: Oncology | Admitting: Oncology

## 2023-10-26 ENCOUNTER — Encounter: Payer: Self-pay | Admitting: Oncology

## 2023-10-26 ENCOUNTER — Inpatient Hospital Stay

## 2023-10-26 VITALS — BP 149/57 | HR 50 | Temp 97.1°F | Resp 15 | Ht 62.0 in | Wt 119.8 lb

## 2023-10-26 DIAGNOSIS — M7122 Synovial cyst of popliteal space [Baker], left knee: Secondary | ICD-10-CM | POA: Diagnosis not present

## 2023-10-26 DIAGNOSIS — R634 Abnormal weight loss: Secondary | ICD-10-CM

## 2023-10-26 DIAGNOSIS — R7989 Other specified abnormal findings of blood chemistry: Secondary | ICD-10-CM | POA: Diagnosis not present

## 2023-10-26 DIAGNOSIS — Z90710 Acquired absence of both cervix and uterus: Secondary | ICD-10-CM | POA: Insufficient documentation

## 2023-10-26 DIAGNOSIS — I82441 Acute embolism and thrombosis of right tibial vein: Secondary | ICD-10-CM

## 2023-10-26 DIAGNOSIS — Z888 Allergy status to other drugs, medicaments and biological substances status: Secondary | ICD-10-CM | POA: Insufficient documentation

## 2023-10-26 DIAGNOSIS — Z880 Allergy status to penicillin: Secondary | ICD-10-CM | POA: Diagnosis not present

## 2023-10-26 DIAGNOSIS — Z8673 Personal history of transient ischemic attack (TIA), and cerebral infarction without residual deficits: Secondary | ICD-10-CM | POA: Diagnosis not present

## 2023-10-26 DIAGNOSIS — Z79899 Other long term (current) drug therapy: Secondary | ICD-10-CM | POA: Insufficient documentation

## 2023-10-26 DIAGNOSIS — I10 Essential (primary) hypertension: Secondary | ICD-10-CM | POA: Insufficient documentation

## 2023-10-26 DIAGNOSIS — Q283 Other malformations of cerebral vessels: Secondary | ICD-10-CM | POA: Insufficient documentation

## 2023-10-26 DIAGNOSIS — Z86718 Personal history of other venous thrombosis and embolism: Secondary | ICD-10-CM | POA: Diagnosis not present

## 2023-10-26 DIAGNOSIS — R040 Epistaxis: Secondary | ICD-10-CM | POA: Insufficient documentation

## 2023-10-26 DIAGNOSIS — R5383 Other fatigue: Secondary | ICD-10-CM | POA: Insufficient documentation

## 2023-10-26 DIAGNOSIS — Z882 Allergy status to sulfonamides status: Secondary | ICD-10-CM | POA: Diagnosis not present

## 2023-10-26 DIAGNOSIS — D6861 Antiphospholipid syndrome: Secondary | ICD-10-CM | POA: Insufficient documentation

## 2023-10-26 DIAGNOSIS — K589 Irritable bowel syndrome without diarrhea: Secondary | ICD-10-CM | POA: Insufficient documentation

## 2023-10-26 DIAGNOSIS — Z885 Allergy status to narcotic agent status: Secondary | ICD-10-CM | POA: Insufficient documentation

## 2023-10-26 DIAGNOSIS — Z886 Allergy status to analgesic agent status: Secondary | ICD-10-CM | POA: Insufficient documentation

## 2023-10-26 DIAGNOSIS — Z807 Family history of other malignant neoplasms of lymphoid, hematopoietic and related tissues: Secondary | ICD-10-CM

## 2023-10-26 DIAGNOSIS — Z881 Allergy status to other antibiotic agents status: Secondary | ICD-10-CM | POA: Diagnosis not present

## 2023-10-26 DIAGNOSIS — Z8249 Family history of ischemic heart disease and other diseases of the circulatory system: Secondary | ICD-10-CM | POA: Diagnosis not present

## 2023-10-26 DIAGNOSIS — Z85828 Personal history of other malignant neoplasm of skin: Secondary | ICD-10-CM | POA: Insufficient documentation

## 2023-10-26 DIAGNOSIS — Z808 Family history of malignant neoplasm of other organs or systems: Secondary | ICD-10-CM | POA: Insufficient documentation

## 2023-10-26 HISTORY — DX: Other malformations of cerebral vessels: Q28.3

## 2023-10-26 HISTORY — DX: Abnormal weight loss: R63.4

## 2023-10-26 HISTORY — DX: Synovial cyst of popliteal space (Baker), left knee: M71.22

## 2023-10-26 LAB — CMP (CANCER CENTER ONLY)
ALT: 12 U/L (ref 0–44)
AST: 15 U/L (ref 15–41)
Albumin: 4.1 g/dL (ref 3.5–5.0)
Alkaline Phosphatase: 54 U/L (ref 38–126)
Anion gap: 6 (ref 5–15)
BUN: 12 mg/dL (ref 8–23)
CO2: 29 mmol/L (ref 22–32)
Calcium: 8.6 mg/dL — ABNORMAL LOW (ref 8.9–10.3)
Chloride: 99 mmol/L (ref 98–111)
Creatinine: 0.98 mg/dL (ref 0.44–1.00)
GFR, Estimated: 55 mL/min — ABNORMAL LOW (ref >60)
Glucose, Bld: 88 mg/dL (ref 70–99)
Potassium: 4.4 mmol/L (ref 3.5–5.1)
Sodium: 134 mmol/L — ABNORMAL LOW (ref 135–145)
Total Bilirubin: 0.6 mg/dL (ref 0.0–1.2)
Total Protein: 6.5 g/dL (ref 6.5–8.1)

## 2023-10-26 LAB — CBC WITH DIFFERENTIAL (CANCER CENTER ONLY)
Abs Immature Granulocytes: 0.02 10*3/uL (ref 0.00–0.07)
Basophils Absolute: 0.1 10*3/uL (ref 0.0–0.1)
Basophils Relative: 1 %
Eosinophils Absolute: 0.1 10*3/uL (ref 0.0–0.5)
Eosinophils Relative: 2 %
HCT: 36.5 % (ref 36.0–46.0)
Hemoglobin: 12.2 g/dL (ref 12.0–15.0)
Immature Granulocytes: 0 %
Lymphocytes Relative: 30 %
Lymphs Abs: 2.2 10*3/uL (ref 0.7–4.0)
MCH: 30.7 pg (ref 26.0–34.0)
MCHC: 33.4 g/dL (ref 30.0–36.0)
MCV: 91.9 fL (ref 80.0–100.0)
Monocytes Absolute: 0.7 10*3/uL (ref 0.1–1.0)
Monocytes Relative: 9 %
Neutro Abs: 4.3 10*3/uL (ref 1.7–7.7)
Neutrophils Relative %: 58 %
Platelet Count: 306 10*3/uL (ref 150–400)
RBC: 3.97 MIL/uL (ref 3.87–5.11)
RDW: 13.7 % (ref 11.5–15.5)
WBC Count: 7.4 10*3/uL (ref 4.0–10.5)
nRBC: 0 % (ref 0.0–0.2)

## 2023-10-26 LAB — CEA (ACCESS): CEA (CHCC): 2.05 ng/mL (ref 0.00–5.00)

## 2023-10-26 LAB — TSH: TSH: 3.15 u[IU]/mL (ref 0.350–4.500)

## 2023-10-26 LAB — LACTATE DEHYDROGENASE: LDH: 162 U/L (ref 98–192)

## 2023-10-26 LAB — D-DIMER, QUANTITATIVE: D-Dimer, Quant: 0.33 ug{FEU}/mL (ref 0.00–0.50)

## 2023-10-26 NOTE — Assessment & Plan Note (Signed)
 Patient has cyst versus mass in the left popliteal fossa.  She is currently scheduled to get an MRI of the right knee on 11/02/2023.  We will arrange for MRI of the left knee around the same time as well.

## 2023-10-26 NOTE — Progress Notes (Signed)
 [facility]  HEMATOLOGY CLINIC CONSULTATION NOTE   PATIENT NAME: Michelle Keith   MR#: [id number] DOB: [birth year]  DATE OF SERVICE: 10/26/2023   REFERRING PROVIDER  Irvin Mantel, MD  Patient Care Team: Aldo Hun, MD as PCP - General (Internal Medicine) Eilleen Grates, MD as PCP - Cardiology (Cardiology) Eilleen Grates, MD as Consulting Physician (Cardiology) Kayla Part, MD as Consulting Physician (Vascular Surgery)   REASON FOR CONSULTATION/ CHIEF COMPLAINT:  Acute right lower extremity DVT  ASSESSMENT & PLAN:  Michelle Keith is a 89 y.o. lady with a past medical history of hypertension, irritable bowel syndrome, GERD, osteoporosis, squamous cell skin cancer, esophageal stricture, was referred to our service for evaluation of possible hypercoagulable state after she was found to have acute right lower extremity DVT in April 2025.    Acute deep vein thrombosis (DVT) of tibial vein of right lower extremity (HCC) Deep vein thrombosis (DVT) in the right leg, identified after swelling. Reports decreased energy since December without significant weight loss, fevers, chills, or night sweats. Currently on Eliquis , but experienced epistaxis. Concerns about potential brain bleeding due to cavernous malformation. Blood work planned to determine underlying cause of DVT. Advised to continue full dose of Eliquis  despite epistaxis, as there is no test to measure blood thinning with Eliquis . Risks of reducing dosage discussed, including potential for clot persistence. Informed that brain bleeding would present with severe symptoms such as stroke-like symptoms, severe headaches, or syncope.  - Continue Eliquis  at full dose for 3-6 months  - Order blood tests to investigate any signs of thrombophilia.  Will check prothrombin gene mutation, factor V Leiden mutation, beta-2 glycoprotein antibodies, anticardiolipin antibodies today.  Rest of the hypercoagulable workup was  deferred given recent thrombosis which can make it falsely abnormal.  - Schedule follow-up ultrasound if necessary  - Advise use of saline spray and humidifier to manage epistaxis  - Arrange phone call to discuss blood test results in 1-2 weeks  RTC in 6 weeks for follow-up of imaging studies and other workup.  Cerebral cavernous malformation Cavernous malformation in the brain, identified in February MRI. Reports slight headache but no severe symptoms indicative of active bleeding. Scheduled for MRI of the brain on May 15 to assess current status. - Proceed with scheduled MRI of the brain on May 15  Unexplained weight loss Unexplained weight loss and fatigue in the last 3 to 4 months.  It would be prudent to rule out underlying malignancy.  Hence we will obtain CT chest, abdomen and pelvis for further evaluation.  She is up-to-date on screening mammogram.  Colonoscopy was several years ago and routine screening was discontinued considering her age.  Cyst, synovial, popliteal space, left Patient has cyst versus mass in the left popliteal fossa.  She is currently scheduled to get an MRI of the right knee on 11/02/2023.  We will arrange for MRI of the left knee around the same time as well.  I reviewed lab results and outside records for this visit and discussed relevant results with the patient. Diagnosis, plan of care and treatment options were also discussed in detail with the patient. Opportunity provided to ask questions and answers provided to her apparent satisfaction. Provided instructions to call our clinic with any problems, questions or concerns prior to return visit. I recommended to continue follow-up with PCP and sub-specialists. She verbalized understanding and agreed with the plan. No barriers to learning was detected.  Arlo Berber, MD  10/26/2023  1:07 PM  [facility] - A DEPT OF [facility] [address] [place] [address] Dept: [phone] Dept Fax: [phone]   HISTORY OF PRESENT ILLNESS:  Discussed the use of AI scribe software for clinical note transcription with the patient, who gave verbal consent to proceed.  History of Present Illness Michelle Keith is an 89 year old female with a history of cavernoma who presents with a blood clot in the right leg and fatigue. She was referred by her podiatrist for evaluation of a blood clot in the right leg.  She has a blood clot in her right leg, initially discovered by her podiatrist after noticing swelling. A scan confirmed the presence of two clots. She is currently on Eliquis , a blood thinner, and has experienced epistaxis since starting the medication. She is concerned about the possibility of a brain bleed due to her history of cavernoma, identified in February 2025.  She has felt less energetic since around Christmas 2024, describing her symptoms as 'less energy' and noting a slight weight loss of three to four pounds. No fevers, chills, or night sweats. She has been experiencing constipation for the past three months, with occasional stomach pain and a sensation of tightness, but no diarrhea or blood in stools.  She has a history of a cavernoma in the brain, discovered in February 2025, and is scheduled for an MRI on Nov 02, 2023, to monitor this condition. She reports a slight headache but no significant vision problems, although her vision is not as clear as before. She recently saw a retina specialist who noted improvement in her eye condition.  She has noticed a bump on her knee, which she initially did not feel but now perceives, and suspects a similar smaller bump on the other knee. She reports no pain in the calf or knee area.  She underwent a mammogram in August 2024, which was normal. Her abdomen was last scanned in October 2023, revealing diverticulosis, but no recent scans have been performed on her lower abdomen.     MEDICAL HISTORY Past Medical History:  Diagnosis Date   Dysautonomia (HCC)    Esophageal stricture    GERD (gastroesophageal reflux disease)    Hiatal hernia    Hypertension    IBS (irritable bowel syndrome)    Ileus (HCC) 11/25/2016   Macular degeneration    Dry OU   Osteopenia    Osteoporosis    Right thyroid  nodule    Squamous cell skin cancer    Syncope    SYNCOPE 07/10/2009   Qualifier: Diagnosis of   By: Rodolfo Clan, MD, Lavella Poser          SURGICAL HISTORY Past Surgical History:  Procedure Laterality Date   C-EYE SURGERY PROCEDURE     30 yrs ago   Cataract extraction     CATARACT EXTRACTION Bilateral 2001   Dr. Lasandra Points   COLONOSCOPY  2009   Also 2002   ESOPHAGEAL DILATION     x 4   ESOPHAGEAL MANOMETRY     ESOPHAGOGASTRODUODENOSCOPY  Multiple   Exploratory laparoscopy     EYE SURGERY Bilateral    Cat Sx - Dr. Dayton Evener FUNDOPLICATION     TOTAL ABDOMINAL HYSTERECTOMY       SOCIAL HISTORY: She reports that she has never smoked. She has never used smokeless tobacco. She reports that she does not drink alcohol and does not  use drugs. Social History   Socioeconomic History   Marital status: Widowed    Spouse name: Not on file   Number of children: 2   Years of education: Not on file   Highest education level: Not on file  Occupational History   Occupation: Retired  Tobacco Use   Smoking status: Never   Smokeless tobacco: Never  Vaping Use   Vaping status: Never Used  Substance and Sexual Activity   Alcohol use: No    Alcohol/week: 0.0 standard drinks of alcohol   Drug use: No   Sexual activity: Not Currently    Partners: Male    Comment: WIDOWED  Other Topics Concern   Not on file  Social History Narrative   Lives alone.  Husband in a nursing home after a stroke.        1 son one daughter   Social Drivers of Corporate investment banker Strain: Not on file  Food Insecurity: Not on file  Transportation Needs: Not on file   Physical Activity: Not on file  Stress: Not on file  Social Connections: Not on file  Intimate Partner Violence: Not on file    FAMILY HISTORY: Her family history includes Heart failure in her mother; Multiple myeloma in her father.  CURRENT MEDICATIONS   Current Outpatient Medications  Medication Instructions   apixaban  (ELIQUIS ) 5 mg, Oral, 2 times daily   Calcium-Vitamin D -Vitamin K (VIACTIV PO) 1 tablet, 2 times daily   denosumab  (PROLIA ) 60 mg,  Once   metoprolol  tartrate (LOPRESSOR ) 25 mg, Oral, 2 times daily   pantoprazole  (PROTONIX ) 40 mg, Every other day   valACYclovir (VALTREX) 500 mg, As needed   Vitamin D  2,000 Units, Daily   zolpidem  (AMBIEN ) 2.5 mg, Daily PRN     ALLERGIES  She is allergic to epinephrine, lidocaine, aspirin, atenolol, clarithromycin, cortisone, hydrocodone, norvasc  [amlodipine ], omeprazole magnesium, penicillins, pindolol , pseudoephedrine-guaifenesin, sulfonamide derivatives, tetracycline, xifaxan  [rifaximin ], glycol stearate, tramadol , and valsartan.  REVIEW OF SYSTEMS:  Review of Systems - Oncology   Rest of the pertinent review of systems is unremarkable except as mentioned above in HPI.  PHYSICAL EXAMINATION:    Onc Performance Status - 10/26/23 1131       ECOG Perf Status   ECOG Perf Status Restricted in physically strenuous activity but ambulatory and able to carry out work of a light or sedentary nature, e.g., light house work, office work      KPS SCALE   KPS % SCORE Normal activity with effort, some s/s of disease             Vitals:   10/26/23 1127  BP: (!) 149/57  Pulse: (!) 50  Resp: 15  Temp: (!) 97.1 F (36.2 C)  SpO2: 100%   Filed Weights   10/26/23 1127  Weight: 119 lb 12.8 oz (54.3 kg)    Physical Exam Constitutional:      General: She is not in acute distress.    Appearance: Normal appearance.  HENT:     Head: Normocephalic and atraumatic.  Eyes:     General: No scleral icterus.     Conjunctiva/sclera: Conjunctivae normal.  Cardiovascular:     Rate and Rhythm: Normal rate and regular rhythm.     Heart sounds: Normal heart sounds.  Pulmonary:     Effort: Pulmonary effort is normal.     Breath sounds: Normal breath sounds.  Abdominal:     General: There is no distension.  Musculoskeletal:  Right lower leg: No edema.     Left lower leg: No edema.     Comments: Swellings noted in popliteal fossae bilaterally-cyst versus mass  Neurological:     General: No focal deficit present.     Mental Status: She is alert and oriented to person, place, and time.  Psychiatric:        Mood and Affect: Mood normal.        Behavior: Behavior normal.        Thought Content: Thought content normal.     LABORATORY DATA:   I have reviewed the data as listed.  Results for orders placed or performed in visit on 10/26/23  CBC with Differential (Cancer Center Only)  Result Value Ref Range   WBC Count 7.4 4.0 - 10.5 K/uL   RBC 3.97 3.87 - 5.11 MIL/uL   Hemoglobin 12.2 12.0 - 15.0 g/dL   HCT 16.1 09.6 - 04.5 %   MCV 91.9 80.0 - 100.0 fL   MCH 30.7 26.0 - 34.0 pg   MCHC 33.4 30.0 - 36.0 g/dL   RDW 40.9 81.1 - 91.4 %   Platelet Count 306 150 - 400 K/uL   nRBC 0.0 0.0 - 0.2 %   Neutrophils Relative % 58 %   Neutro Abs 4.3 1.7 - 7.7 K/uL   Lymphocytes Relative 30 %   Lymphs Abs 2.2 0.7 - 4.0 K/uL   Monocytes Relative 9 %   Monocytes Absolute 0.7 0.1 - 1.0 K/uL   Eosinophils Relative 2 %   Eosinophils Absolute 0.1 0.0 - 0.5 K/uL   Basophils Relative 1 %   Basophils Absolute 0.1 0.0 - 0.1 K/uL   Immature Granulocytes 0 %   Abs Immature Granulocytes 0.02 0.00 - 0.07 K/uL     RADIOGRAPHIC STUDIES:  I have personally reviewed the radiological images as listed and agreed with the findings in the report.  OCT, Retina - OU - Both Eyes Result Date: 10/18/2023 Right Eye Quality was good. Central Foveal Thickness: 356. Progression has improved. Findings include normal foveal  contour, no IRF, no SRF, retinal drusen , pigment epithelial detachment (Persistent drusen - stable from prior, blunted foveal contour, interval improvement in mild pucker nasal mac). Left Eye Quality was good. Central Foveal Thickness: 428. Progression has been stable. Findings include no IRF, no SRF, abnormal foveal contour, retinal drusen , epiretinal membrane (ERM with central retinal thickening -- stable). Notes Images taken, stored on drive Diagnosis / Impression:  nonexudative ARMD OU -- stable drusen; blunted foveal contour; interval improvement in mild pucker nasal mac ERM OS -- mild -- stable from prior Clinical management: See below Abbreviations: NFP - Normal foveal profile. CME - cystoid macular edema. PED - pigment epithelial detachment. IRF - intraretinal fluid. SRF - subretinal fluid. EZ - ellipsoid zone. ERM - epiretinal membrane. ORA - outer retinal atrophy. ORT - outer retinal tubulation. SRHM - subretinal hyper-reflective material   VAS US  LOWER EXTREMITY VENOUS (DVT) Result Date: 10/05/2023  Lower Venous DVT Study Patient Name:  IVERSON BEILFUSS  Date of Exam:   10/04/2023 Medical Rec #: [id number]      Accession #:    [id number] Date of Birth: June 27, 1931     Patient Gender: F Patient Age:   60 years Exam Location:  [facility] Procedure:      VAS US  LOWER EXTREMITY VENOUS (DVT) Referring Phys: Bobbie Burows --------------------------------------------------------------------------------  Indications: Edema.  Comparison Study: No prior study Performing Technologist: Delford Felling MHA, RDMS, RVT, RDCS  Examination Guidelines: A complete evaluation includes B-mode imaging, spectral Doppler, color Doppler, and power Doppler as needed of all accessible portions of each vessel. Bilateral testing is considered an integral part of a complete examination. Limited examinations for reoccurring indications may be performed as noted. The reflux portion of the exam is performed with the patient in reverse  Trendelenburg.  +---------+---------------+---------+-----------+----------+--------------+ RIGHT    CompressibilityPhasicitySpontaneityPropertiesThrombus Aging +---------+---------------+---------+-----------+----------+--------------+ CFV      Full           Yes      Yes                                 +---------+---------------+---------+-----------+----------+--------------+ SFJ      Full                                                        +---------+---------------+---------+-----------+----------+--------------+ FV Prox  Full                                                        +---------+---------------+---------+-----------+----------+--------------+ FV Mid   Full           Yes      Yes                                 +---------+---------------+---------+-----------+----------+--------------+ FV DistalFull                                                        +---------+---------------+---------+-----------+----------+--------------+ PFV      Full                                                        +---------+---------------+---------+-----------+----------+--------------+ POP      Full           Yes      Yes                                 +---------+---------------+---------+-----------+----------+--------------+ PTV      None                    No                   Acute          +---------+---------------+---------+-----------+----------+--------------+ PERO     None                    No                   Acute          +---------+---------------+---------+-----------+----------+--------------+   +----+---------------+---------+-----------+----------+--------------+ LEFTCompressibilityPhasicitySpontaneityPropertiesThrombus Aging +----+---------------+---------+-----------+----------+--------------+ CFV  Full           Yes      Yes                                  +----+---------------+---------+-----------+----------+--------------+     Summary: RIGHT: - Findings consistent with acute deep vein thrombosis involving the right posterior tibial veins, and right peroneal veins.  - There is a heterogenous area of the right popliteal fossa with peripheral and internal low resistant vascularity, measuring 5.3 x 1.6 x 2.7cm. Suggestive of possible ganglion cyst versus mass versus unknown etiology. Further imaging may be warranted if  clinically indicated.  LEFT: - No evidence of common femoral vein obstruction.   *See table(s) above for measurements and observations. Electronically signed by Irvin Mantel on 10/05/2023 at 9:46:20 AM.    Final     Orders Placed This Encounter  Procedures   MR KNEE LEFT W WO CONTRAST    Standing Status:   Future    Expected Date:   11/02/2023    Expiration Date:   10/25/2024    Scheduling Instructions:     Patient scheduled for MRI of right knee and MRI brain on 11/02/23. Please arrange MRI left knee at the same time.    If indicated for the ordered procedure, I authorize the administration of contrast media per Radiology protocol:   Yes    What is the patient's sedation requirement?:   No Sedation    Does the patient have a pacemaker or implanted devices?:   No    Preferred imaging location?:   [facility]   CT CHEST ABDOMEN PELVIS W CONTRAST    Standing Status:   Future    Expected Date:   11/09/2023    Expiration Date:   10/25/2024    If indicated for the ordered procedure, I authorize the administration of contrast media per Radiology protocol:   Yes    Does the patient have a contrast media/X-ray dye allergy?:   No    Preferred imaging location?:   [facility]    If indicated for the ordered procedure, I authorize the administration of oral contrast media per Radiology protocol:   Yes   CBC with Differential (Cancer Center Only)    Standing Status:   Future    Number of Occurrences:   1    Expiration Date:    10/25/2024   CMP (Cancer Center only)    Standing Status:   Future    Number of Occurrences:   1    Expiration Date:   10/25/2024   Lactate dehydrogenase    Standing Status:   Future    Number of Occurrences:   1    Expiration Date:   10/25/2024   TSH    Standing Status:   Future    Number of Occurrences:   1    Expiration Date:   10/25/2024   D-dimer, quantitative    Standing Status:   Future    Number of Occurrences:   1    Expiration Date:   10/25/2024   Factor 5 leiden    Standing Status:   Future    Number of Occurrences:   1    Expiration Date:   10/25/2024   Prothrombin gene mutation    Standing Status:   Future    Number of Occurrences:   1    Expiration Date:   10/25/2024  Beta-2-glycoprotein i abs, IgG/M/A    Standing Status:   Future    Number of Occurrences:   1    Expiration Date:   10/25/2024   Cardiolipin antibodies, IgG, IgM, IgA    Standing Status:   Future    Number of Occurrences:   1    Expiration Date:   10/25/2024   [facility] ONLY    Standing Status:   Future    Number of Occurrences:   1    Expiration Date:   10/25/2024   CA 125    Standing Status:   Future    Number of Occurrences:   1    Expiration Date:   10/25/2024    Future Appointments  Date Time Provider Department Center  11/02/2023  3:20 PM [facility]  11/02/2023  4:00 PM [facility]  11/09/2023 10:45 AM Daevion Navarette, MD [facility] None  12/07/2023 10:00 AM [facility] None  12/07/2023 10:30 AM Ameri Cahoon, Gale Jude, MD [facility] None  01/11/2024 10:15 AM Charity Conch, DPM [facility]  07/16/2024  1:00 PM Ronelle Coffee, MD [facility] None    I spent a total of 55 minutes during this encounter with the patient including review of chart and various tests results, discussions about plan of care and coordination of care plan.  This document was completed utilizing speech recognition software. Grammatical errors, random  word insertions, pronoun errors, and incomplete sentences are an occasional consequence of this system due to software limitations, ambient noise, and hardware issues. Any formal questions or concerns about the content, text or information contained within the body of this dictation should be directly addressed to the provider for clarification.

## 2023-10-26 NOTE — Assessment & Plan Note (Addendum)
 Deep vein thrombosis (DVT) in the right leg, identified after swelling. Reports decreased energy since December without significant weight loss, fevers, chills, or night sweats. Currently on Eliquis , but experienced epistaxis. Concerns about potential brain bleeding due to cavernous malformation. Blood work planned to determine underlying cause of DVT. Advised to continue full dose of Eliquis  despite epistaxis, as there is no test to measure blood thinning with Eliquis . Risks of reducing dosage discussed, including potential for clot persistence. Informed that brain bleeding would present with severe symptoms such as stroke-like symptoms, severe headaches, or syncope.  - Continue Eliquis  at full dose for 3-6 months  - Order blood tests to investigate any signs of thrombophilia.  Will check prothrombin gene mutation, factor V Leiden mutation, beta-2 glycoprotein antibodies, anticardiolipin antibodies today.  Rest of the hypercoagulable workup was deferred given recent thrombosis which can make it falsely abnormal.  - Schedule follow-up ultrasound if necessary  - Advise use of saline spray and humidifier to manage epistaxis  - Arrange phone call to discuss blood test results in 1-2 weeks  RTC in 6 weeks for follow-up of imaging studies and other workup.

## 2023-10-26 NOTE — Assessment & Plan Note (Signed)
 Unexplained weight loss and fatigue in the last 3 to 4 months.  It would be prudent to rule out underlying malignancy.  Hence we will obtain CT chest, abdomen and pelvis for further evaluation.  She is up-to-date on screening mammogram.  Colonoscopy was several years ago and routine screening was discontinued considering her age.

## 2023-10-26 NOTE — Assessment & Plan Note (Signed)
 Cavernous malformation in the brain, identified in February MRI. Reports slight headache but no severe symptoms indicative of active bleeding. Scheduled for MRI of the brain on May 15 to assess current status. - Proceed with scheduled MRI of the brain on May 15

## 2023-10-27 LAB — CA 125: Cancer Antigen (CA) 125: 14.7 U/mL (ref 0.0–38.1)

## 2023-10-27 LAB — CARDIOLIPIN ANTIBODIES, IGG, IGM, IGA
Anticardiolipin IgA: <9 U/mL (ref 0–11)
Anticardiolipin IgG: <9 GPL U/mL (ref 0–14)
Anticardiolipin IgM: 14 [MPL'U]/mL — ABNORMAL HIGH (ref 0–12)

## 2023-10-28 LAB — BETA-2-GLYCOPROTEIN I ABS, IGG/M/A
Beta-2 Glyco I IgG: <9 GPI IgG units (ref 0–20)
Beta-2-Glycoprotein I IgA: <9 GPI IgA units (ref 0–25)
Beta-2-Glycoprotein I IgM: <9 GPI IgM units (ref 0–32)

## 2023-10-31 LAB — PROTHROMBIN GENE MUTATION

## 2023-10-31 LAB — FACTOR 5 LEIDEN

## 2023-11-02 ENCOUNTER — Inpatient Hospital Stay: Admission: RE | Admit: 2023-11-02 | Source: Ambulatory Visit

## 2023-11-02 ENCOUNTER — Other Ambulatory Visit

## 2023-11-02 ENCOUNTER — Ambulatory Visit
Admission: RE | Admit: 2023-11-02 | Discharge: 2023-11-02 | Discharge status: Home / Self Care | Source: Ambulatory Visit | Attending: Nurse Practitioner

## 2023-11-02 DIAGNOSIS — Q283 Other malformations of cerebral vessels: Secondary | ICD-10-CM

## 2023-11-06 DIAGNOSIS — K080 Exfoliation of teeth due to systemic causes: Secondary | ICD-10-CM | POA: Diagnosis not present

## 2023-11-07 ENCOUNTER — Other Ambulatory Visit: Payer: Self-pay | Admitting: Internal Medicine

## 2023-11-07 DIAGNOSIS — G2581 Restless legs syndrome: Secondary | ICD-10-CM

## 2023-11-07 DIAGNOSIS — R2241 Localized swelling, mass and lump, right lower limb: Secondary | ICD-10-CM

## 2023-11-08 ENCOUNTER — Inpatient Hospital Stay (HOSPITAL_BASED_OUTPATIENT_CLINIC_OR_DEPARTMENT_OTHER): Admitting: Oncology

## 2023-11-08 DIAGNOSIS — R634 Abnormal weight loss: Secondary | ICD-10-CM | POA: Diagnosis not present

## 2023-11-08 DIAGNOSIS — I82441 Acute embolism and thrombosis of right tibial vein: Secondary | ICD-10-CM

## 2023-11-08 NOTE — Progress Notes (Unsigned)
 [facility]  HEMATOLOGY-ONCOLOGY ELECTRONIC VISIT PROGRESS NOTE  PATIENT NAME: Michelle [facility]   MR#: [id number] DOB: 1932-03-14  DATE OF SERVICE: 11/08/2023  Patient Care Team: Aldo Hun, MD as PCP - General (Internal Medicine) Eilleen Grates, MD as PCP - Cardiology (Cardiology) Eilleen Grates, MD as Consulting Physician (Cardiology) Kayla Part, MD as Consulting Physician (Vascular Surgery)  I connected with the patient via telephone conference and verified that I am speaking with the correct person using two identifiers. The patient's location is at home and I am providing care [facility].  I discussed the limitations, risks, security and privacy concerns of performing an evaluation and management service by e-visits and the availability of in person appointments. I also discussed with the patient that there may be a patient responsible charge related to this service. The patient expressed understanding and agreed to proceed.   ASSESSMENT & PLAN:   Michelle Keith is a 89 y.o.  lady with a past medical history of hypertension, irritable bowel syndrome, GERD, osteoporosis, squamous cell skin cancer, esophageal stricture, was referred to our service in May 2025 for evaluation of possible hypercoagulable state after she was found to have acute right lower extremity DVT in April 2025.  Grossly negative thrombophilia workup.  Acute deep vein thrombosis (DVT) of tibial vein of right lower extremity (HCC) Deep vein thrombosis (DVT) in the right leg, identified after swelling. Reports decreased energy since December without significant weight loss, fevers, chills, or night sweats. Currently on Eliquis , but experienced epistaxis. Concerns about potential brain bleeding due to cavernous malformation. Blood work planned to determine underlying cause of DVT. Advised to continue full dose of Eliquis  despite epistaxis, as there is no test to measure blood thinning with  Eliquis . Risks of reducing dosage discussed, including potential for clot persistence. Informed that brain bleeding would present with severe symptoms such as stroke-like symptoms, severe headaches, or syncope.  - Continue Eliquis  at full dose for 3-6 months  On her consultation with us  on 10/26/2023, we pursued hypercoagulable workup.  Prothrombin gene mutation, factor V Leiden mutation were both negative.  Beta-2  glycoprotein antibodies were negative.  Anticardiolipin IgM antibodies were weak positive.  This needs to be repeated at a later date.  Rest of the hypercoagulable workup was deferred given recent thrombosis which can make it falsely abnormal.  The decision to continue or stop anticoagulation will be based on follow-up assessments, including an ultrasound at the 69-month mark. If another clot occurs, long-term anticoagulation may be necessary.  - Arrange for an ultrasound at the 42-month mark to assess the status of blood clots.  This will be ordered on future visit.  RTC in 6 weeks for follow-up of imaging studies and other workup.  Unexplained weight loss Unexplained weight loss and fatigue in the last 3 to 4 months.  It would be prudent to rule out underlying malignancy.  Hence we will obtain CT chest, abdomen and pelvis for further evaluation.  This is currently scheduled on June 3.  CEA, CA-125 were within normal limits recently.  She is up-to-date on screening mammogram.  Colonoscopy was several years ago and routine screening was discontinued considering her age.   Anticardiolipin antibody, weak positive Anticardiolipin antibody level was 14, which is barely above the negative threshold of 13 and significantly below the positive threshold of 80. This is likely a false positive and not indicative of antiphospholipid antibody syndrome. No immediate concern for increased risk of clotting based on  this result. - Repeat anticardiolipin antibody test in the future to confirm  results.   I discussed the assessment and treatment plan with the patient. The patient was provided an opportunity to ask questions and all were answered. The patient agreed with the plan and demonstrated an understanding of the instructions. The patient was advised to call back or seek an in-person evaluation if the symptoms worsen or if the condition fails to improve as anticipated.    I spent 12 minutes over the phone with the patient reviewing test results, discuss management and coordination/planning of care.  Arlo Berber, MD 11/08/2023 10:45 AM [facility] - A DEPT OF [facility] [address] [place] [address] Dept: [phone] Dept Fax: [phone]   INTERVAL HISTORY:  Please see above for problem oriented charting.  The purpose of today's discussion is to explain recent lab results and to formulate plan of care.  Discussed the use of AI scribe software for clinical note transcription with the patient, who gave verbal consent to proceed.  History of Present Illness [facility] is an 89 year old female who presents for follow-up regarding blood clots and recent blood work results.  She has been experiencing blood clots and is currently on anticoagulation therapy. There is concern about the possibility of recurrent clots. Recent blood tests showed normal blood counts, kidney and liver function, and thyroid  function. Tumor markers CEA and CA-125 were negative. There was a weak positive result for anti-cardiolipin antibody, slightly above the negative threshold. This will be re-evaluated in the future.  She reports a gradual loss of strength since Christmas, which has been slowly improving.  She is awaiting the results of a brain MRI conducted on Nov 02, 2023, which has not yet been read due to a backlog. She has not been informed of any urgent findings.  She is scheduled for a CT scan on November 21, 2023, and  a follow-up appointment on December 07, 2023. She also mentions an upcoming scan for the back of her knees, scheduled for November 23, 2023.    SUMMARY OF HEMATOLOGY HISTORY:  She has a blood clot in her right leg, initially discovered by her podiatrist after noticing swelling. A scan confirmed the presence of two clots. She is currently on Eliquis , a blood thinner, and has experienced epistaxis since starting the medication. She is concerned about the possibility of a brain bleed due to her history of cavernoma, identified in February 2025.   She has felt less energetic since around Christmas 2024, describing her symptoms as 'less energy' and noting a slight weight loss of three to four pounds. No fevers, chills, or night sweats. She has been experiencing constipation for the past three months, with occasional stomach pain and a sensation of tightness, but no diarrhea or blood in stools.   She has a history of a cavernoma in the brain, discovered in February 2025, and is scheduled for an MRI on Nov 02, 2023, to monitor this condition. She reports a slight headache but no significant vision problems, although her vision is not as clear as before. She recently saw a retina specialist who noted improvement in her eye condition.   She has noticed a bump on her knee, which she initially did not feel but now perceives, and suspects a similar smaller bump on the other knee. She reports no pain in the calf or knee area.   She underwent a  mammogram in August 2024, which was normal. Her abdomen was last scanned in October 2023, revealing diverticulosis, but no recent scans have been performed on her lower abdomen.  Currently on Eliquis , but experienced epistaxis. Concerns about potential brain bleeding due to cavernous malformation. Blood work planned to determine underlying cause of DVT. Advised to continue full dose of Eliquis  despite epistaxis, as there is no test to measure blood thinning with Eliquis . Risks of  reducing dosage discussed, including potential for clot persistence. Informed that brain bleeding would present with severe symptoms such as stroke-like symptoms, severe headaches, or syncope.   Continue Eliquis  at full dose for 3-6 months   On her consultation with us  on 10/26/2023, we pursued hypercoagulable workup.  Prothrombin gene mutation, factor V Leiden mutation were both negative.  Beta-2  glycoprotein antibodies were negative.  Anticardiolipin IgM antibodies were weak positive.  This needs to be repeated at a later date.  Rest of the hypercoagulable workup was deferred given recent thrombosis which can make it falsely abnormal.    REVIEW OF SYSTEMS:    Review of Systems - Oncology  All other pertinent systems were reviewed with the patient and are negative.  I have reviewed the past medical history, past surgical history, social history and family history with the patient and they are unchanged from previous note.  ALLERGIES:  She is allergic to epinephrine, lidocaine, aspirin, atenolol, clarithromycin, cortisone, hydrocodone, norvasc  [amlodipine ], omeprazole magnesium, penicillins, pindolol , pseudoephedrine-guaifenesin, sulfonamide derivatives, tetracycline, xifaxan  [rifaximin ], glycol stearate, tramadol , and valsartan.  MEDICATIONS:  Current Outpatient Medications  Medication Sig Dispense Refill   apixaban  (ELIQUIS ) 5 MG TABS tablet Take 1 tablet (5 mg total) by mouth 2 (two) times daily. 60 tablet 4   Calcium-Vitamin D -Vitamin K (VIACTIV PO) Take 1 tablet by mouth 2 (two) times daily.     Cholecalciferol  (VITAMIN D ) 50 MCG (2000 UT) tablet Take 2,000 Units by mouth daily. (Patient not taking: Reported on 10/26/2023)     denosumab  (PROLIA ) 60 MG/ML SOSY injection Inject 60 mg into the skin once. 2 times a year     metoprolol  tartrate (LOPRESSOR ) 25 MG tablet Take 1 tablet (25 mg total) by mouth 2 (two) times daily. 60 tablet 5   pantoprazole  (PROTONIX ) 40 MG tablet Take 40 mg by mouth  every other day. Patient states she takes it every other day prn     valACYclovir (VALTREX) 500 MG tablet Take 500 mg by mouth as needed (fever blisters).     zolpidem  (AMBIEN ) 10 MG tablet Take 2.5 mg by mouth daily as needed for sleep.      No current facility-administered medications for this visit.    PHYSICAL EXAMINATION:    Onc Performance Status - 11/08/23 1000       ECOG Perf Status   ECOG Perf Status Restricted in physically strenuous activity but ambulatory and able to carry out work of a light or sedentary nature, e.g., light house work, office work      KPS SCALE   KPS % SCORE Normal activity with effort, some s/s of disease             LABORATORY DATA:   I have reviewed the data as listed.  Recent Results (from the past 2160 hours)  Myocardial Perfusion Imaging     Status: None   Collection Time: 08/29/23  1:00 PM  Result Value Ref Range   Rest HR 59.0 bpm   Rest BP 159/76 mmHg   Peak HR 104 bpm  Peak BP 159/76 mmHg   Rest Nuclear Isotope Dose 10.1 mCi   Stress Nuclear Isotope Dose 30.9 mCi   SSS 1.0    SRS 0.0    SDS 1.0    TID 0.72    LV sys vol 12.0 mL   LV dias vol 45.0 46 - 106 mL   Nuc Stress EF 74 %   ST Depression (mm) 0 mm  CA 125     Status: None   Collection Time: 10/26/23 12:20 PM  Result Value Ref Range   Cancer Antigen (CA) 125 14.7 0.0 - 38.1 U/mL    Comment: (NOTE) Roche Diagnostics Electrochemiluminescence Immunoassay (ECLIA) Values obtained with different assay methods or kits cannot be used interchangeably.  Results cannot be interpreted as absolute evidence of the presence or absence of malignant disease. Performed At: [facility] [address] [place], [place] [id number] Pearlean Botts MD HB:[id number]   CEA (Access)-[facility] ONLY     Status: None   Collection Time: 10/26/23 12:20 PM  Result Value Ref Range   CEA ([facility]) 2.05 0.00 - 5.00 ng/mL    Comment: (NOTE) This test was performed using Beckman Coulter's  paramagnetic chemiluminescent immunoassay. Values obtained from different assay methods cannot be used interchangeably. Please note that up to 8% of patients who smoke may see values 5.1-10.0 ng/ml and 1% of patients who smoke may see CEA levels >10.0 ng/ml. Performed at [facility], [address], [place], [place] [address]   Cardiolipin antibodies, IgG, IgM, IgA     Status: Abnormal   Collection Time: 10/26/23 12:20 PM  Result Value Ref Range   Anticardiolipin IgG <9 0 - 14 GPL U/mL    Comment: (NOTE)                          Negative:              <15                          Indeterminate:     15 - 20                          Low-Med Positive: >20 - 80                          High Positive:         >80    Anticardiolipin IgM 14 (H) 0 - 12 MPL U/mL    Comment: (NOTE)                          Negative:              <13                          Indeterminate:     13 - 20                          Low-Med Positive: >20 - 80                          High Positive:         >80    Anticardiolipin IgA <9 0 - 11 APL U/mL  Comment: (NOTE)                          Negative:              <12                          Indeterminate:     12 - 20                          Low-Med Positive: >20 - 80                          High Positive:         >80 Performed At: [facility] [address] [place], [place] [id number] Pearlean Botts MD WU:[id number]   Beta-2 -glycoprotein i abs, IgG/M/A     Status: None   Collection Time: 10/26/23 12:20 PM  Result Value Ref Range   Beta-2  Glyco I IgG <9 0 - 20 GPI IgG units    Comment: (NOTE) The reference interval reflects a 3SD or 99th percentile interval, which is thought to represent a potentially clinically significant result in accordance with the International Consensus Statement on the classification criteria for definitive antiphospholipid syndrome (APS). J Thromb Haem 2006;4:295-306.    Beta-2 -Glycoprotein I  IgM <9 0 - 32 GPI IgM units    Comment: (NOTE) The reference interval reflects a 3SD or 99th percentile interval, which is thought to represent a potentially clinically significant result in accordance with the International Consensus Statement on the classification criteria for definitive antiphospholipid syndrome (APS). J Thromb Haem 2006;4:295-306. Performed At: [facility] [address] [place], [place] [id number] Pearlean Botts MD VH:[id number]    Beta-2 -Glycoprotein I IgA <9 0 - 25 GPI IgA units    Comment: (NOTE) The reference interval reflects a 3SD or 99th percentile interval, which is thought to represent a potentially clinically significant result in accordance with the International Consensus Statement on the classification criteria for definitive antiphospholipid syndrome (APS). J Thromb Haem 2006;4:295-306.   Prothrombin gene mutation     Status: None   Collection Time: 10/26/23 12:20 PM  Result Value Ref Range   Recommendations-PTGENE: Comment     Comment: (NOTE) Result: c.*97G>A - Not Detected This result is not associated with an increased risk for venous thromboembolism. See Additional Clinical Information and Comments. Additional Clinical Information: Venous thromboembolism is a multifactorial disease influenced by genetic, environmental, and circumstantial risk factors. The c.*97G>A variant in the F2 gene is a genetic risk factor for venous thromboembolism. Heterozygous carriers have a 2- to 4-fold increased risk for venous thromboembolism. Homozygotes for the c.*97G>A variant are rare. The annual risk of VTE in homozygotes has been reported to be 1.1%/year. Individuals who carry both a c.*97G>A variant in the F2 gene and a c.1601G>A (p. Arg534Gln) variant in the F5 gene (commonly referred to as Factor V Leiden) have an approximately 20- fold increased risk for venous thromboembolism. Risks are likely to be even higher in more complex genotype  combinations involving the F2 c.*97G>A variant and Factor V Leiden (PMID:  41324401). Additional risk factors include but are not limited to: deficiency of protein C, protein S, or antithrombin III, age, female sex, personal or family history of deep vein thromboembolism, smoking, surgery, prolonged immobilization, malignant neoplasm, tamoxifen treatment, raloxifene  treatment, oral contraceptive use, hormone replacement therapy, and pregnancy.  Management of thrombotic risk and thrombotic events should follow established guidelines and fit the clinical circumstance. This result cannot predict the occurrence or recurrence of a thrombotic event. Comments: Genetic counseling is recommended to discuss the potential clinical implications of positive results, as well as recommendations for testing family members. Genetic Coordinators are available for health care providers to discuss results at [phone]). Test Details: Variant analyzed: c.*97G>A, previously referred to as G20210A Methods/Limitations: DNA analysis of the F2 gene (NM_000 506.5) was performed by PCR amplification followed by restriction enzyme analysis. The diagnostic sensitivity is >99%. Results must be combined with clinical information for the most accurate interpretation. Molecular-based testing is highly accurate, but as in any laboratory test, diagnostic errors may occur. False positive or false negative results may occur for reasons that include genetic variants, blood transfusions, bone marrow transplantation, somatic or tissue-specific mosaicism, mislabeled samples, or erroneous representation of family relationships. This test was developed and its performance characteristics determined by [facility]. It has not been cleared or approved by the Food and Drug Administration. References: Bhatt S, Taylor AK, Lozano R, [facility], Manfred [facility]; ACMG Professional Practice and Guidelines Committee. Addendum: [employer] of Medical Genetics consensus statement on factor V Leiden mutation testing. Genet Med. 2021 Mar 5. doi: 96.0454/U981 [phone]-x. PMID: 19147829. Nanetta Baars. Prothrombin Thrombophilia. 2006 Jul 25 [Updated 2021 Feb 4]. In: Jerri Morale, Ardinger HH, Pagon RA, et al., editors. GeneReviews(R) [Internet]. [address] [facility]): [employer] , [place]; 5621-3086. Available from: [email or web address] Lynda Sands, Huang X, Luo B, Spector EB, Nicolasa Barrett CS; ACMG Laboratory Quality Assurance Committee. Venous thromboembolism laboratory testing (factor V Leiden and factor II c.*97G>A), 2018 update: a technical standard of the [employer] of [employer] and Genomics (ACMG). Genet Med. 2018 Dec;20(12):1489-1498. doi: 10.1038/[phone]. Epub 2018 Oct 5. PMID: 57846962.    Reviewed by: Comment     Comment: (NOTE) Technical Component performed at [employer] [facility] Professional Component performed by: W. Andrew Banister, PhD, [facility], [facility], [address] [place] [address] Performed At: [facility] [address] [place], [place] [id number] Adams Adams MDPhD VO:[id number]   Factor 5 leiden     Status: None   Collection Time: 10/26/23 12:20 PM  Result Value Ref Range   Recommendations-F5LEID: Comment     Comment: (NOTE) Result: c.1601G>A (p.Arg534Gln) - Not Detected This result is not associated with an increased risk for venous thromboembolism. See Additional Clinical Information and Comments. Additional Clinical Information: Venous thromboembolism is a multifactorial disease influenced by genetic, environmental, and circumstantial risk factors. The c.1601G>A (p. Arg534Gln) variant in the F5 gene, commonly referred to as Factor V Leiden, is a genetic risk factor for venous thromboembolism. Heterozygous carriers of this variant have a 6- to 8- fold increased risk for venous thromboembolism. Individuals homozygous for  this variant (ie, with a copy of the variant on each chromosome) have an approximately 80-fold increased risk for venous thromboembolism. Individuals who carry both a c.*97G>A variant in the F2 gene and Factor V Leiden have an approximately 20-fold increased risk for venous thromboembolism. Risks are likely to be even higher in more complex genotype combinations in volving the F2 c.*97G>A variant and Factor V Leiden (PMID: 42595638). Additional risk factors include but are not limited to: deficiency of protein C, protein S, or antithrombin III, age, female sex, personal or family history of deep vein thromboembolism, smoking, surgery, prolonged immobilization, malignant neoplasm, tamoxifen treatment, raloxifene  treatment, oral contraceptive use, hormone replacement therapy, and pregnancy. Management of  thrombotic risk and thrombotic events should follow established guidelines and fit the clinical circumstance. This result cannot predict the occurrence or recurrence of a thrombotic event. Comment: Genetic counseling is recommended to discuss the potential clinical implications of positive results, as well as recommendations for testing family members. Genetic Coordinators are available for health care providers to discuss results at [phone]). Test Details: Variant Analyzed: c.1601G>A (p. Arg534Gln), referred to as Fact or V Leiden Methods/Limitations: DNA analysis of the F5 gene (NM_000130.5) was performed by PCR amplification followed by restriction enzyme analysis. The diagnostic sensitivity is >99%. Results must be combined with clinical information for the most accurate interpretation. Molecular- based testing is highly accurate, but as in any laboratory test, diagnostic errors may occur. False positive or false negative results may occur for reasons that include genetic variants, blood transfusions, bone marrow transplantation, somatic or tissue-specific mosaicism,  mislabeled samples, or erroneous representation of family relationships. This test was developed and its performance characteristics determined by [facility]. It has not been cleared or approved by the Food and Drug Administration. References: Bhatt S, Taylor AK, Lozano R, [facility], Manfred Seed [facility]; ACMG Professional Practice and Guidelines Committee. Addendum: [employer] of Medical Genetics consensus statement on fac tor V Leiden mutation testing. Genet Med. 2021 Mar 5. doi: 95.6213/Y86578-469- 01108-x. PMID: 62952841. Nanetta Baars. Factor V Leiden Thrombophilia. 1999 May 14 (Updated 2018 Jan 4). In: Jerri Morale, Ardinger HH, Pagon RA, et al., editors. GeneReviews(R) (Internet). [address] [facility]): [employer] [place] , [place]; 3244-0102. Available from: [email or web address]/ Lynda Sands, Huang X, Luo B, Spector EB, Nicolasa Barrett CS; ACMG Laboratory Quality Assurance Committee. Venous thromboembolism laboratory testing (factor V Leiden and factor II c.*97G>A), 2018 update: a technical standard of the [employer] of [employer] and Genomics (ACMG). Genet Med. 2018 Dec;20(12):1489-1498. doi: 10.1038/[phone])[phone]. Epub 2018 Oct 5. PMID: 72536644.    Reviewed By: Comment     Comment: (NOTE) Technical Component performed at [employer] [facility] Professional Component performed by: Gwyneth Lesch, PhD, [facility] [id number], [facility], [address] [place] [address] Performed At: [facility] [address] [place], [place] [id number] Adams Adams MDPhD [id number]   D-dimer, quantitative     Status: None   Collection Time: 10/26/23 12:20 PM  Result Value Ref Range   D-Dimer, Quant 0.33 0.00 - 0.50 ug/mL-FEU    Comment: (NOTE) At the manufacturer cut-off value of 0.5 g/mL FEU, this assay has a negative predictive value of 95-100%.This assay is intended for use in conjunction with a clinical pretest probability (PTP) assessment model to exclude  pulmonary embolism (PE) and deep venous thrombosis (DVT) in outpatients suspected of PE or DVT. Results should be correlated with clinical presentation. Performed at [facility], [address], [place], [place] [address]   CMP (Cancer Center only)     Status: Abnormal   Collection Time: 10/26/23 12:20 PM  Result Value Ref Range   Sodium 134 (L) 135 - 145 mmol/L   Potassium 4.4 3.5 - 5.1 mmol/L   Chloride 99 98 - 111 mmol/L   CO2 29 22 - 32 mmol/L   Glucose, Bld 88 70 - 99 mg/dL    Comment: Glucose reference range applies only to samples taken after fasting for at least 8 hours.   BUN 12 8 - 23 mg/dL   Creatinine 0.16 0.10 - 1.00 mg/dL   Calcium 8.6 (L) 8.9 - 10.3 mg/dL   Total Protein 6.5 6.5 - 8.1 g/dL   Albumin 4.1 3.5 -  5.0 g/dL   AST 15 15 - 41 U/L   ALT 12 0 - 44 U/L   Alkaline Phosphatase 54 38 - 126 U/L   Total Bilirubin 0.6 0.0 - 1.2 mg/dL   GFR, Estimated 55 (L) >60 mL/min    Comment: (NOTE) Calculated using the CKD-EPI Creatinine Equation (2021)    Anion gap 6 5 - 15    Comment: Performed at [facility], [address] [place], [place] [address]  CBC with Differential (Cancer Center Only)     Status: None   Collection Time: 10/26/23 12:20 PM  Result Value Ref Range   WBC Count 7.4 4.0 - 10.5 K/uL   RBC 3.97 3.87 - 5.11 MIL/uL   Hemoglobin 12.2 12.0 - 15.0 g/dL   HCT 29.5 62.1 - 30.8 %   MCV 91.9 80.0 - 100.0 fL   MCH 30.7 26.0 - 34.0 pg   MCHC 33.4 30.0 - 36.0 g/dL   RDW 65.7 84.6 - 96.2 %   Platelet Count 306 150 - 400 K/uL   nRBC 0.0 0.0 - 0.2 %   Neutrophils Relative % 58 %   Neutro Abs 4.3 1.7 - 7.7 K/uL   Lymphocytes Relative 30 %   Lymphs Abs 2.2 0.7 - 4.0 K/uL   Monocytes Relative 9 %   Monocytes Absolute 0.7 0.1 - 1.0 K/uL   Eosinophils Relative 2 %   Eosinophils Absolute 0.1 0.0 - 0.5 K/uL   Basophils Relative 1 %   Basophils Absolute 0.1 0.0 - 0.1 K/uL   Immature Granulocytes 0 %   Abs Immature  Granulocytes 0.02 0.00 - 0.07 K/uL    Comment: Performed at [facility], [address], [place], [place] [address]  TSH     Status: None   Collection Time: 10/26/23 12:21 PM  Result Value Ref Range   TSH 3.150 0.350 - 4.500 uIU/mL    Comment: Performed at [facility], [address], [place], [place] [address]  Lactate dehydrogenase     Status: None   Collection Time: 10/26/23 12:21 PM  Result Value Ref Range   LDH 162 98 - 192 U/L    Comment: Performed at [facility], [address] [place], [place] [address]     RADIOGRAPHIC STUDIES: No recent pertinent imaging studies available to review.  No orders of the defined types were placed in this encounter.    Future Appointments  Date Time Provider Department Center  11/21/2023 12:00 PM [facility]  11/23/2023  1:20 PM [facility] MR 3 [facility]315[facility]  11/23/2023  2:00 PM [facility] MR 3 [facility]  12/07/2023 10:00 AM [facility] None  12/07/2023 10:30 AM Izaiha Lo, Gale Jude, MD [facility] None  01/11/2024 10:15 AM Charity Conch, DPM [facility]  07/16/2024  1:00 PM Ronelle Coffee, MD [facility] None    This document was completed utilizing speech recognition software. Grammatical errors, random word insertions, pronoun errors, and incomplete sentences are an occasional consequence of this system due to software limitations, ambient noise, and hardware issues. Any formal questions or concerns about the content, text or information contained within the body of this dictation should be directly addressed to the provider for clarification.

## 2023-11-09 ENCOUNTER — Ambulatory Visit (HOSPITAL_COMMUNITY)

## 2023-11-09 ENCOUNTER — Telehealth: Admitting: Oncology

## 2023-11-14 ENCOUNTER — Encounter: Payer: Self-pay | Admitting: Oncology

## 2023-11-14 NOTE — Assessment & Plan Note (Signed)
 Unexplained weight loss and fatigue in the last 3 to 4 months.  It would be prudent to rule out underlying malignancy.  Hence we will obtain CT chest, abdomen and pelvis for further evaluation.  This is currently scheduled on June 3.  CEA, CA-125 were within normal limits recently.  She is up-to-date on screening mammogram.  Colonoscopy was several years ago and routine screening was discontinued considering her age.

## 2023-11-14 NOTE — Assessment & Plan Note (Addendum)
 Deep vein thrombosis (DVT) in the right leg, identified after swelling. Reports decreased energy since December without significant weight loss, fevers, chills, or night sweats. Currently on Eliquis , but experienced epistaxis. Concerns about potential brain bleeding due to cavernous malformation. Blood work planned to determine underlying cause of DVT. Advised to continue full dose of Eliquis  despite epistaxis, as there is no test to measure blood thinning with Eliquis . Risks of reducing dosage discussed, including potential for clot persistence. Informed that brain bleeding would present with severe symptoms such as stroke-like symptoms, severe headaches, or syncope.  - Continue Eliquis  at full dose for 3-6 months  On her consultation with us  on 10/26/2023, we pursued hypercoagulable workup.  Prothrombin gene mutation, factor V Leiden mutation were both negative.  Beta-2  glycoprotein antibodies were negative.  Anticardiolipin IgM antibodies were weak positive.  This needs to be repeated at a later date.  Rest of the hypercoagulable workup was deferred given recent thrombosis which can make it falsely abnormal.  The decision to continue or stop anticoagulation will be based on follow-up assessments, including an ultrasound at the 73-month mark. If another clot occurs, long-term anticoagulation may be necessary.  - Arrange for an ultrasound at the 28-month mark to assess the status of blood clots.  This will be ordered on future visit.  RTC in 6 weeks for follow-up of imaging studies and other workup.

## 2023-11-20 ENCOUNTER — Other Ambulatory Visit: Payer: Self-pay | Admitting: Oncology

## 2023-11-20 DIAGNOSIS — R634 Abnormal weight loss: Secondary | ICD-10-CM

## 2023-11-21 ENCOUNTER — Telehealth: Payer: Self-pay | Admitting: Oncology

## 2023-11-21 ENCOUNTER — Encounter: Payer: Self-pay | Admitting: Internal Medicine

## 2023-11-21 ENCOUNTER — Other Ambulatory Visit (HOSPITAL_COMMUNITY)

## 2023-11-21 ENCOUNTER — Ambulatory Visit (HOSPITAL_COMMUNITY)
Admission: RE | Admit: 2023-11-21 | Discharge: 2023-11-21 | Disposition: A | Discharge status: Home / Self Care | Source: Ambulatory Visit | Attending: Oncology | Admitting: Oncology

## 2023-11-21 DIAGNOSIS — R109 Unspecified abdominal pain: Secondary | ICD-10-CM | POA: Insufficient documentation

## 2023-11-21 DIAGNOSIS — Z86718 Personal history of other venous thrombosis and embolism: Secondary | ICD-10-CM | POA: Diagnosis not present

## 2023-11-21 DIAGNOSIS — R634 Abnormal weight loss: Secondary | ICD-10-CM | POA: Diagnosis not present

## 2023-11-21 DIAGNOSIS — I700 Atherosclerosis of aorta: Secondary | ICD-10-CM | POA: Diagnosis not present

## 2023-11-21 DIAGNOSIS — Z9889 Other specified postprocedural states: Secondary | ICD-10-CM | POA: Diagnosis not present

## 2023-11-21 DIAGNOSIS — R5383 Other fatigue: Secondary | ICD-10-CM | POA: Diagnosis not present

## 2023-11-21 DIAGNOSIS — K769 Liver disease, unspecified: Secondary | ICD-10-CM | POA: Diagnosis not present

## 2023-11-21 DIAGNOSIS — K5730 Diverticulosis of large intestine without perforation or abscess without bleeding: Secondary | ICD-10-CM | POA: Insufficient documentation

## 2023-11-21 DIAGNOSIS — K7689 Other specified diseases of liver: Secondary | ICD-10-CM | POA: Diagnosis not present

## 2023-11-21 NOTE — Telephone Encounter (Signed)
 Returning PT phone call, left a VM.

## 2023-11-23 ENCOUNTER — Ambulatory Visit
Admission: RE | Admit: 2023-11-23 | Discharge: 2023-11-23 | Disposition: A | Discharge status: Home / Self Care | Source: Ambulatory Visit | Attending: Internal Medicine | Admitting: Internal Medicine

## 2023-11-23 ENCOUNTER — Other Ambulatory Visit

## 2023-11-23 DIAGNOSIS — M25461 Effusion, right knee: Secondary | ICD-10-CM | POA: Diagnosis not present

## 2023-11-23 DIAGNOSIS — G2581 Restless legs syndrome: Secondary | ICD-10-CM

## 2023-11-23 DIAGNOSIS — M23322 Other meniscus derangements, posterior horn of medial meniscus, left knee: Secondary | ICD-10-CM | POA: Diagnosis not present

## 2023-11-23 DIAGNOSIS — M23341 Other meniscus derangements, anterior horn of lateral meniscus, right knee: Secondary | ICD-10-CM | POA: Diagnosis not present

## 2023-11-23 DIAGNOSIS — M23342 Other meniscus derangements, anterior horn of lateral meniscus, left knee: Secondary | ICD-10-CM | POA: Diagnosis not present

## 2023-11-23 DIAGNOSIS — R2241 Localized swelling, mass and lump, right lower limb: Secondary | ICD-10-CM

## 2023-11-24 ENCOUNTER — Other Ambulatory Visit

## 2023-12-06 ENCOUNTER — Other Ambulatory Visit: Payer: Self-pay | Admitting: Oncology

## 2023-12-06 DIAGNOSIS — I82441 Acute embolism and thrombosis of right tibial vein: Secondary | ICD-10-CM

## 2023-12-07 ENCOUNTER — Other Ambulatory Visit: Payer: Self-pay

## 2023-12-07 ENCOUNTER — Inpatient Hospital Stay: Admitting: Oncology

## 2023-12-07 ENCOUNTER — Encounter: Payer: Self-pay | Admitting: Oncology

## 2023-12-07 ENCOUNTER — Inpatient Hospital Stay: Attending: Oncology

## 2023-12-07 ENCOUNTER — Telehealth: Payer: Self-pay

## 2023-12-07 VITALS — BP 146/64 | HR 52 | Temp 97.6°F | Resp 19 | Wt 120.3 lb

## 2023-12-07 DIAGNOSIS — I82441 Acute embolism and thrombosis of right tibial vein: Secondary | ICD-10-CM

## 2023-12-07 DIAGNOSIS — K5730 Diverticulosis of large intestine without perforation or abscess without bleeding: Secondary | ICD-10-CM | POA: Insufficient documentation

## 2023-12-07 DIAGNOSIS — K589 Irritable bowel syndrome without diarrhea: Secondary | ICD-10-CM | POA: Diagnosis not present

## 2023-12-07 DIAGNOSIS — Z888 Allergy status to other drugs, medicaments and biological substances status: Secondary | ICD-10-CM | POA: Insufficient documentation

## 2023-12-07 DIAGNOSIS — K219 Gastro-esophageal reflux disease without esophagitis: Secondary | ICD-10-CM | POA: Diagnosis not present

## 2023-12-07 DIAGNOSIS — S83242A Other tear of medial meniscus, current injury, left knee, initial encounter: Secondary | ICD-10-CM | POA: Insufficient documentation

## 2023-12-07 DIAGNOSIS — M810 Age-related osteoporosis without current pathological fracture: Secondary | ICD-10-CM | POA: Insufficient documentation

## 2023-12-07 DIAGNOSIS — S83241A Other tear of medial meniscus, current injury, right knee, initial encounter: Secondary | ICD-10-CM | POA: Insufficient documentation

## 2023-12-07 DIAGNOSIS — Z79899 Other long term (current) drug therapy: Secondary | ICD-10-CM | POA: Diagnosis not present

## 2023-12-07 DIAGNOSIS — Z881 Allergy status to other antibiotic agents status: Secondary | ICD-10-CM | POA: Diagnosis not present

## 2023-12-07 DIAGNOSIS — J984 Other disorders of lung: Secondary | ICD-10-CM | POA: Insufficient documentation

## 2023-12-07 DIAGNOSIS — Z8673 Personal history of transient ischemic attack (TIA), and cerebral infarction without residual deficits: Secondary | ICD-10-CM | POA: Insufficient documentation

## 2023-12-07 DIAGNOSIS — Z880 Allergy status to penicillin: Secondary | ICD-10-CM | POA: Insufficient documentation

## 2023-12-07 DIAGNOSIS — K7689 Other specified diseases of liver: Secondary | ICD-10-CM | POA: Diagnosis not present

## 2023-12-07 DIAGNOSIS — R040 Epistaxis: Secondary | ICD-10-CM | POA: Diagnosis not present

## 2023-12-07 DIAGNOSIS — I10 Essential (primary) hypertension: Secondary | ICD-10-CM | POA: Insufficient documentation

## 2023-12-07 DIAGNOSIS — Z86718 Personal history of other venous thrombosis and embolism: Secondary | ICD-10-CM | POA: Insufficient documentation

## 2023-12-07 DIAGNOSIS — M659 Unspecified synovitis and tenosynovitis, unspecified site: Secondary | ICD-10-CM | POA: Diagnosis not present

## 2023-12-07 DIAGNOSIS — M25661 Stiffness of right knee, not elsewhere classified: Secondary | ICD-10-CM | POA: Diagnosis not present

## 2023-12-07 DIAGNOSIS — S83281A Other tear of lateral meniscus, current injury, right knee, initial encounter: Secondary | ICD-10-CM | POA: Insufficient documentation

## 2023-12-07 DIAGNOSIS — Z85828 Personal history of other malignant neoplasm of skin: Secondary | ICD-10-CM | POA: Insufficient documentation

## 2023-12-07 DIAGNOSIS — R5383 Other fatigue: Secondary | ICD-10-CM | POA: Insufficient documentation

## 2023-12-07 DIAGNOSIS — K222 Esophageal obstruction: Secondary | ICD-10-CM | POA: Insufficient documentation

## 2023-12-07 DIAGNOSIS — R634 Abnormal weight loss: Secondary | ICD-10-CM | POA: Diagnosis not present

## 2023-12-07 DIAGNOSIS — I700 Atherosclerosis of aorta: Secondary | ICD-10-CM | POA: Diagnosis not present

## 2023-12-07 DIAGNOSIS — M7050 Other bursitis of knee, unspecified knee: Secondary | ICD-10-CM | POA: Diagnosis not present

## 2023-12-07 DIAGNOSIS — Z886 Allergy status to analgesic agent status: Secondary | ICD-10-CM | POA: Insufficient documentation

## 2023-12-07 DIAGNOSIS — Z885 Allergy status to narcotic agent status: Secondary | ICD-10-CM | POA: Insufficient documentation

## 2023-12-07 DIAGNOSIS — Z882 Allergy status to sulfonamides status: Secondary | ICD-10-CM | POA: Insufficient documentation

## 2023-12-07 LAB — CMP (CANCER CENTER ONLY)
ALT: 11 U/L (ref 0–44)
AST: 15 U/L (ref 15–41)
Albumin: 3.8 g/dL (ref 3.5–5.0)
Alkaline Phosphatase: 48 U/L (ref 38–126)
Anion gap: 3 — ABNORMAL LOW (ref 5–15)
BUN: 12 mg/dL (ref 8–23)
CO2: 28 mmol/L (ref 22–32)
Calcium: 8.5 mg/dL — ABNORMAL LOW (ref 8.9–10.3)
Chloride: 103 mmol/L (ref 98–111)
Creatinine: 0.9 mg/dL (ref 0.44–1.00)
GFR, Estimated: >60 mL/min (ref >60)
Glucose, Bld: 85 mg/dL (ref 70–99)
Potassium: 4.7 mmol/L (ref 3.5–5.1)
Sodium: 134 mmol/L — ABNORMAL LOW (ref 135–145)
Total Bilirubin: 0.5 mg/dL (ref 0.0–1.2)
Total Protein: 6.1 g/dL — ABNORMAL LOW (ref 6.5–8.1)

## 2023-12-07 LAB — CBC WITH DIFFERENTIAL (CANCER CENTER ONLY)
Abs Immature Granulocytes: 0.01 10*3/uL (ref 0.00–0.07)
Basophils Absolute: 0 10*3/uL (ref 0.0–0.1)
Basophils Relative: 1 %
Eosinophils Absolute: 0.1 10*3/uL (ref 0.0–0.5)
Eosinophils Relative: 1 %
HCT: 34.2 % — ABNORMAL LOW (ref 36.0–46.0)
Hemoglobin: 11.5 g/dL — ABNORMAL LOW (ref 12.0–15.0)
Immature Granulocytes: 0 %
Lymphocytes Relative: 31 %
Lymphs Abs: 1.8 10*3/uL (ref 0.7–4.0)
MCH: 30.9 pg (ref 26.0–34.0)
MCHC: 33.6 g/dL (ref 30.0–36.0)
MCV: 91.9 fL (ref 80.0–100.0)
Monocytes Absolute: 0.6 10*3/uL (ref 0.1–1.0)
Monocytes Relative: 10 %
Neutro Abs: 3.3 10*3/uL (ref 1.7–7.7)
Neutrophils Relative %: 57 %
Platelet Count: 274 10*3/uL (ref 150–400)
RBC: 3.72 MIL/uL — ABNORMAL LOW (ref 3.87–5.11)
RDW: 14 % (ref 11.5–15.5)
WBC Count: 5.9 10*3/uL (ref 4.0–10.5)
nRBC: 0 % (ref 0.0–0.2)

## 2023-12-07 LAB — D-DIMER, QUANTITATIVE: D-Dimer, Quant: <0.27 ug{FEU}/mL (ref 0.00–0.50)

## 2023-12-07 NOTE — Assessment & Plan Note (Addendum)
 Unexplained weight loss and fatigue in the last 3 to 4 months.  It would be prudent to rule out underlying malignancy.  Hence we obtained CT chest, abdomen and pelvis and on 11/21/2023, it showed no evidence of malignancy.  CEA, CA-125 were within normal limits recently.  She is up-to-date on screening mammogram.  Colonoscopy was several years ago and routine screening was discontinued considering her age.

## 2023-12-07 NOTE — Assessment & Plan Note (Addendum)
 Deep vein thrombosis (DVT) in the right leg, identified after swelling. Reports decreased energy since December without significant weight loss, fevers, chills, or night sweats. Currently on Eliquis , but experienced epistaxis. Concerns about potential brain bleeding due to cavernous malformation. Blood work planned to determine underlying cause of DVT. Advised to continue full dose of Eliquis  despite epistaxis, as there is no test to measure blood thinning with Eliquis . Risks of reducing dosage discussed, including potential for clot persistence. Informed that brain bleeding would present with severe symptoms such as stroke-like symptoms, severe headaches, or syncope.  - Continue Eliquis  at full dose for 3-6 months  On her consultation with us  on 10/26/2023, we pursued hypercoagulable workup.  Prothrombin gene mutation, factor V Leiden mutation were both negative.  Beta-2  glycoprotein antibodies were negative.  Anticardiolipin IgM antibodies were weak positive.  This needs to be repeated at a later date.  Rest of the hypercoagulable workup was deferred given recent thrombosis which can make it falsely abnormal.  On 11/23/2023, MRI of bilateral knees showed meniscal tears in both knees.  This could have limited her mobility provoking the DVT.  The decision to continue or stop anticoagulation will be based on follow-up assessments, including an ultrasound at the 79-month mark. If another clot occurs, long-term anticoagulation may be necessary.  - Arrange for an ultrasound at the 69-month mark to assess the status of blood clots.  This will be tentatively scheduled on 01/30/2024.  RTC on 02/06/2024 for follow-up at our [facility]

## 2023-12-07 NOTE — Telephone Encounter (Signed)
 Spoke with patient's daughter regarding next appointments for US , lab work, and with Dr. Randye Buttner. US  on 01/30/24 and the rest on 02/06/24 at 10:30 am. Daughter agreeable as she is the one who transports the mother.

## 2023-12-07 NOTE — Progress Notes (Signed)
 [name]  HEMATOLOGY CLINIC PROGRESS NOTE  PATIENT NAME: Michelle Keith   MR#: [id number] DOB: 11/22/1931  Patient Care Team: Aldo Hun, MD as PCP - General (Internal Medicine) Eilleen Grates, MD as PCP - Cardiology (Cardiology) Eilleen Grates, MD as Consulting Physician (Cardiology) Kayla Part, MD as Consulting Physician (Vascular Surgery)  Date of visit: 12/07/2023   ASSESSMENT & PLAN:   Michelle Keith is a 89 y.o.  lady with a past medical history of hypertension, irritable bowel syndrome, GERD, osteoporosis, squamous cell skin cancer, esophageal stricture, was referred to our service in May 2025 for evaluation of possible hypercoagulable state after she was found to have acute right lower extremity DVT in April 2025.  Grossly negative thrombophilia workup.    Acute deep vein thrombosis (DVT) of tibial vein of right lower extremity (HCC) Deep vein thrombosis (DVT) in the right leg, identified after swelling. Reports decreased energy since December without significant weight loss, fevers, chills, or night sweats. Currently on Eliquis , but experienced epistaxis. Concerns about potential brain bleeding due to cavernous malformation. Blood work planned to determine underlying cause of DVT. Advised to continue full dose of Eliquis  despite epistaxis, as there is no test to measure blood thinning with Eliquis . Risks of reducing dosage discussed, including potential for clot persistence. Informed that brain bleeding would present with severe symptoms such as stroke-like symptoms, severe headaches, or syncope.  - Continue Eliquis  at full dose for 3-6 months  On her consultation with us  on 10/26/2023, we pursued hypercoagulable workup.  Prothrombin gene mutation, factor V Leiden mutation were both negative.  Beta-2  glycoprotein antibodies were negative.  Anticardiolipin IgM antibodies were weak positive.  This needs to be repeated at a later date.  Rest of the  hypercoagulable workup was deferred given recent thrombosis which can make it falsely abnormal.  On 11/23/2023, MRI of bilateral knees showed meniscal tears in both knees.  This could have limited her mobility provoking the DVT.  The decision to continue or stop anticoagulation will be based on follow-up assessments, including an ultrasound at the 55-month mark. If another clot occurs, long-term anticoagulation may be necessary.  - Arrange for an ultrasound at the 42-month mark to assess the status of blood clots.  This will be tentatively scheduled on 01/30/2024.  RTC on 02/06/2024 for follow-up at our [facility]  Unexplained weight loss Unexplained weight loss and fatigue in the last 3 to 4 months.  It would be prudent to rule out underlying malignancy.  Hence we obtained CT chest, abdomen and pelvis and on 11/21/2023, it showed no evidence of malignancy.  CEA, CA-125 were within normal limits recently.  She is up-to-date on screening mammogram.  Colonoscopy was several years ago and routine screening was discontinued considering her age.   Meniscal tears in both knees MRI reveals meniscal tears in both knees, more prominent on the right. The tears do not cause significant pain but may limit mobility, potentially contributing to blood clot formation. Surgery is not recommended due to her age and lack of significant pain.  Diverticulosis CT scan indicates diverticulosis, which may contribute to constipation. Constipation management is necessary to prevent complications. - Recommend stool softeners to manage constipation. - Consider fiber supplements for constipation management.  Calcium and Prolia  treatment Discussed the importance of maintaining adequate calcium levels for bone health, especially in the context of Prolia  treatment. She is advised to increase calcium intake to support bone health. - Increase calcium intake to 1200 mg  per day if current intake is 600 mg. - Continue  vitamin D  supplementation.  Follow-up Plan to follow up after the ultrasound in August to review results and adjust the treatment plan as necessary. - Schedule follow-up appointment in mid-August, preferably on a Tuesday at the [facility]. - Arrange for the ultrasound to be done a week before the follow-up appointment.    I spent a total of 30 minutes during this encounter with the patient including review of chart and various tests results, discussions about plan of care and coordination of care plan.  I reviewed lab results and outside records for this visit and discussed relevant results with the patient. Diagnosis, plan of care and treatment options were also discussed in detail with the patient. Opportunity provided to ask questions and answers provided to her apparent satisfaction. Provided instructions to call our clinic with any problems, questions or concerns prior to return visit. I recommended to continue follow-up with PCP and sub-specialists. She verbalized understanding and agreed with the plan. No barriers to learning was detected.  Arlo Berber, MD  12/07/2023 2:15 PM  [facility] - A DEPT OF [facility] [address] [place] [address] Dept: [phone] Dept Fax: [phone]   CHIEF COMPLAINT/ REASON FOR VISIT:  Follow-up for history of right lower extremity DVT.  Negative thrombophilia workup.  She does have meniscal tears in both knees, limiting mobility.  INTERVAL HISTORY:  Discussed the use of AI scribe software for clinical note transcription with the patient, who gave verbal consent to proceed.  History of Present Illness Michelle Keith is an 89 year old female who presents for follow-up on her blood clots.  She is experiencing discomfort and limited mobility due to her blood clots and is currently on blood thinners. Recent imaging, including a CT scan of the chest, abdomen, and pelvis,  showed no concerning findings for cancer but did reveal diverticulosis. An MRI of the brain showed no bleeding, and an MRI of the knees revealed meniscal tears in both knees, more prominent on the right side. She does not experience significant pain but notes stiffness, particularly in the right knee.  She has a history of low sodium and low protein levels. Her current sodium level is 134 mmol/L, slightly below the normal range, and her protein level is 6.1 g/dL, also slightly low. She is attempting to increase her protein intake but finds it challenging, as previous attempts to use protein shakes made her feel unwell.  She is concerned about her calcium levels as she is due for a Prolia  shot, which requires adequate calcium levels. She currently takes one calcium supplement daily and also takes vitamin D  supplements.  She has a history of constipation, which has worsened recently. She has been using Colace, although she is cautious about ingredients due to previous adverse reactions. She has not tried Dulcolax or MiraLAX due to concerns about glycol content.    SUMMARY OF HEMATOLOGIC HISTORY:  She had a blood clot in her right leg, initially discovered by her podiatrist after noticing swelling. A scan confirmed the presence of two clots. She is currently on Eliquis , a blood thinner, and has experienced epistaxis since starting the medication. She is concerned about the possibility of a brain bleed due to her history of cavernoma, identified in February 2025.   She has felt less energetic since around Christmas 2024, describing her symptoms as 'less energy' and noting a  slight weight loss of three to four pounds. No fevers, chills, or night sweats. She has been experiencing constipation for the past three months, with occasional stomach pain and a sensation of tightness, but no diarrhea or blood in stools.   She has a history of a cavernoma in the brain, discovered in February 2025, and is scheduled  for an MRI on Nov 02, 2023, to monitor this condition. She reports a slight headache but no significant vision problems, although her vision is not as clear as before. She recently saw a retina specialist who noted improvement in her eye condition.   She has noticed a bump on her knee, which she initially did not feel but now perceives, and suspects a similar smaller bump on the other knee. She reports no pain in the calf or knee area.   She underwent a mammogram in August 2024, which was normal. Her abdomen was last scanned in October 2023, revealing diverticulosis, but no recent scans have been performed on her lower abdomen.   Currently on Eliquis , but experienced epistaxis. Concerns about potential brain bleeding due to cavernous malformation. Blood work planned to determine underlying cause of DVT. Advised to continue full dose of Eliquis  despite epistaxis, as there is no test to measure blood thinning with Eliquis . Risks of reducing dosage discussed, including potential for clot persistence. Informed that brain bleeding would present with severe symptoms such as stroke-like symptoms, severe headaches, or syncope.   Continue Eliquis  at full dose for 3-6 months   On her consultation with us  on 10/26/2023, we pursued hypercoagulable workup.  Prothrombin gene mutation, factor V Leiden mutation were both negative.  Beta-2  glycoprotein antibodies were negative.  Anticardiolipin IgM antibodies were weak positive.  This needs to be repeated at a later date.  Rest of the hypercoagulable workup was deferred given recent thrombosis which can make it falsely abnormal.  On 11/02/2023, MRI of the brain showed stable small cavernoma within the right corona radiata.  No evidence of bleeding.  On 11/21/2023, CT chest, abdomen and pelvis was obtained for further evaluation of her unexplained weight loss.  No signs of malignancy.  On 11/23/2023, MRI of bilateral knees showed meniscal tears in both knees.  This could  have limited her mobility provoking the DVT.  I have reviewed the past medical history, past surgical history, social history and family history with the patient and they are unchanged from previous note.  ALLERGIES: She is allergic to epinephrine, lidocaine, aspirin, atenolol, clarithromycin, cortisone, hydrocodone, norvasc  [amlodipine ], omeprazole magnesium, penicillins, pindolol , pseudoephedrine-guaifenesin, sulfonamide derivatives, tetracycline, xifaxan  [rifaximin ], glycol stearate, tramadol , and valsartan.  MEDICATIONS:  Current Outpatient Medications  Medication Sig Dispense Refill   apixaban  (ELIQUIS ) 5 MG TABS tablet Take 1 tablet (5 mg total) by mouth 2 (two) times daily. 60 tablet 4   Calcium-Vitamin D -Vitamin K (VIACTIV PO) Take 1 tablet by mouth 2 (two) times daily.     Cholecalciferol  (VITAMIN D ) 50 MCG (2000 UT) tablet Take 2,000 Units by mouth daily.     denosumab  (PROLIA ) 60 MG/ML SOSY injection Inject 60 mg into the skin once. 2 times a year     metoprolol  tartrate (LOPRESSOR ) 25 MG tablet Take 1 tablet (25 mg total) by mouth 2 (two) times daily. 60 tablet 5   pantoprazole  (PROTONIX ) 40 MG tablet Take 40 mg by mouth every other day. Patient states she takes it every other day prn     valACYclovir (VALTREX) 500 MG tablet Take 500 mg by mouth as needed (  fever blisters).     zolpidem  (AMBIEN ) 10 MG tablet Take 2.5 mg by mouth daily as needed for sleep.      No current facility-administered medications for this visit.     REVIEW OF SYSTEMS:    Review of Systems - Oncology  All other pertinent systems were reviewed with the patient and are negative.  PHYSICAL EXAMINATION:    Onc Performance Status - 12/07/23 1102       ECOG Perf Status   ECOG Perf Status Restricted in physically strenuous activity but ambulatory and able to carry out work of a light or sedentary nature, e.g., light house work, office work      KPS SCALE   KPS % SCORE Normal activity with effort, some  s/s of disease          Vitals:   12/07/23 1049 12/07/23 1050  BP: (!) 154/61 (!) 146/64  Pulse: (!) 52   Resp: 19   Temp: 97.6 F (36.4 C)   SpO2: 100%    Filed Weights   12/07/23 1049  Weight: 120 lb 4.8 oz (54.6 kg)    Physical Exam Constitutional:      General: She is not in acute distress.    Appearance: Normal appearance.  HENT:     Head: Normocephalic and atraumatic.   Cardiovascular:     Rate and Rhythm: Normal rate.  Pulmonary:     Effort: Pulmonary effort is normal. No respiratory distress.  Abdominal:     General: There is no distension.   Neurological:     General: No focal deficit present.     Mental Status: She is alert and oriented to person, place, and time.   Psychiatric:        Mood and Affect: Mood normal.        Behavior: Behavior normal.      LABORATORY DATA:   I have reviewed the data as listed.  Results for orders placed or performed in visit on 12/07/23  CBC with Differential (Cancer Center Only)  Result Value Ref Range   WBC Count 5.9 4.0 - 10.5 K/uL   RBC 3.72 (L) 3.87 - 5.11 MIL/uL   Hemoglobin 11.5 (L) 12.0 - 15.0 g/dL   HCT 82.9 (L) 56.2 - 13.0 %   MCV 91.9 80.0 - 100.0 fL   MCH 30.9 26.0 - 34.0 pg   MCHC 33.6 30.0 - 36.0 g/dL   RDW 86.5 78.4 - 69.6 %   Platelet Count 274 150 - 400 K/uL   nRBC 0.0 0.0 - 0.2 %   Neutrophils Relative % 57 %   Neutro Abs 3.3 1.7 - 7.7 K/uL   Lymphocytes Relative 31 %   Lymphs Abs 1.8 0.7 - 4.0 K/uL   Monocytes Relative 10 %   Monocytes Absolute 0.6 0.1 - 1.0 K/uL   Eosinophils Relative 1 %   Eosinophils Absolute 0.1 0.0 - 0.5 K/uL   Basophils Relative 1 %   Basophils Absolute 0.0 0.0 - 0.1 K/uL   Immature Granulocytes 0 %   Abs Immature Granulocytes 0.01 0.00 - 0.07 K/uL  CMP (Cancer Center only)  Result Value Ref Range   Sodium 134 (L) 135 - 145 mmol/L   Potassium 4.7 3.5 - 5.1 mmol/L   Chloride 103 98 - 111 mmol/L   CO2 28 22 - 32 mmol/L   Glucose, Bld 85 70 - 99 mg/dL   BUN  12 8 - 23 mg/dL   Creatinine 2.95 2.84 - 1.00 mg/dL  Calcium 8.5 (L) 8.9 - 10.3 mg/dL   Total Protein 6.1 (L) 6.5 - 8.1 g/dL   Albumin 3.8 3.5 - 5.0 g/dL   AST 15 15 - 41 U/L   ALT 11 0 - 44 U/L   Alkaline Phosphatase 48 38 - 126 U/L   Total Bilirubin 0.5 0.0 - 1.2 mg/dL   GFR, Estimated >19 >14 mL/min   Anion gap 3 (L) 5 - 15  D-dimer, quantitative  Result Value Ref Range   D-Dimer, Quant <0.27 0.00 - 0.50 ug/mL-FEU     RADIOGRAPHIC STUDIES:  I have personally reviewed the radiological images as listed and agree with the findings in the report.  MR KNEE LEFT WO CONTRAST Result Date: 11/23/2023 CLINICAL DATA:  Left knee pain and palpable mass in the popliteal fossa. EXAM: MRI OF THE LEFT KNEE WITHOUT CONTRAST TECHNIQUE: Multiplanar, multisequence MR imaging of the knee was performed. No intravenous contrast was administered. COMPARISON:  None Available. FINDINGS: MENISCI Medial meniscus: Oblique coursing inferior articular surface tear involving the posterior horn. Lateral meniscus: Oblique coursing inferior articular surface tear involving the anterior horn. LIGAMENTS Cruciates:  Intact Collaterals:  Intact CARTILAGE Patellofemoral: Advanced degenerative chondrosis with joint space narrowing and early spurring. Areas of full or near full-thickness cartilage loss mainly medially. Medial: Moderate to advanced degenerative chondrosis with cartilage thinning, fissuring and fraying. Associated early joint space narrowing and spurring. Lateral: Moderate to advanced degenerative chondrosis with areas of full or near full-thickness cartilage loss. Joint space narrowing and spurring noted. Joint:  Moderate-sized joint effusion. Popliteal Fossa: Moderate to large Baker's cyst likely accounting for the patient's palpable abnormality. Extensor Mechanism: The patella retinacular structures are intact and the quadriceps and patellar tendons are intact. Bones:  No acute bony findings.  No osteochondral  lesion. Other: Unremarkable knee musculature. IMPRESSION: 1. Medial and lateral meniscal tears as described above. 2. Intact ligamentous structures and no acute bony findings. 3. Tricompartmental degenerative changes as detailed above. 4. Moderate-sized joint effusion and moderate to large Baker's cyst likely accounting for the patient's palpable abnormality. Electronically Signed   By: Marrian Siva M.D.   On: 11/23/2023 14:46   MR KNEE RIGHT WO CONTRAST Result Date: 11/23/2023 CLINICAL DATA:  Knee pain and palpable posterior mass. EXAM: MRI OF THE RIGHT KNEE WITHOUT CONTRAST TECHNIQUE: Multiplanar, multisequence MR imaging of the knee was performed. No intravenous contrast was administered. COMPARISON:  None FINDINGS: MENISCI Medial meniscus:  Intact Lateral meniscus: Complex and fairly extensive tear involving the anterior horn and midbody junction region. The anterior horn is likely detached with associated lateral protrusion the meniscus estimated at 5 mm. There is also an inferior articular surface flap type tear with a flipped meniscal fragment in the parafemoral recess. LIGAMENTS Cruciates:  Intact Collaterals:  Intact.  Mild MCL and pes anserine bursitis. CARTILAGE Patellofemoral: Advanced degenerative chondrosis with significant cartilage thinning and joint space narrowing. Medial: Moderate degenerative chondrosis with early joint space narrowing. Lateral: Advanced degenerative chondrosis with areas of full or near full-thickness cartilage loss, joint space narrowing and spurring. Joint:  Moderate to large joint effusion and mild synovitis. Popliteal Fossa: Moderate-sized leaking Baker's cyst likely accounting for the patient's palpable abnormality. No mass is identified. Extensor Mechanism: The patella retinacular structures are intact and the quadriceps and patellar tendons are intact. Bones:  No acute bony findings.  No osteochondral abnormality. Other: Unremarkable knee musculature. IMPRESSION: 1.  Complex and fairly extensive tear involving the anterior horn and midbody junction region of the lateral meniscus. The  anterior horn is likely detached with associated lateral protrusion the meniscus estimated at 5 mm. There is also an inferior articular surface flap type tear with a flipped meniscal fragment in the parafemoral recess. 2. Intact ligamentous structures and no acute bony findings. 3. Tricompartmental degenerative changes most significant in the lateral compartment. 4. Moderate to large joint effusion and mild synovitis. 5. Moderate-sized leaking Baker's cyst likely accounting for the patient's palpable abnormality. Electronically Signed   By: Marrian Siva M.D.   On: 11/23/2023 14:42   CT CHEST ABDOMEN PELVIS WO CONTRAST Result Date: 11/22/2023 CLINICAL DATA:  Unexplained weight loss, fatigue, abdominal pain, DVT, concern for malignancy * Tracking Code: BO * EXAM: CT CHEST, ABDOMEN AND PELVIS WITHOUT CONTRAST TECHNIQUE: Multidetector CT imaging of the chest, abdomen and pelvis was performed following the standard protocol without IV contrast. Oral enteric contrast was administered. RADIATION DOSE REDUCTION: This exam was performed according to the departmental dose-optimization program which includes automated exposure control, adjustment of the mA and/or kV according to patient size and/or use of iterative reconstruction technique. COMPARISON:  CT abdomen pelvis, 04/05/2022 MR abdomen, 05/04/2022 FINDINGS: CT CHEST FINDINGS Cardiovascular: Aortic atherosclerosis. Normal heart size. No pericardial effusion. Mediastinum/Nodes: No enlarged mediastinal, hilar, or axillary lymph nodes. Thyroid  gland, trachea, and esophagus demonstrate no significant findings. Lungs/Pleura: Bandlike scarring of the bilateral lung bases. No pleural effusion or pneumothorax. Musculoskeletal: No chest wall abnormality. No acute osseous findings. CT ABDOMEN PELVIS FINDINGS Hepatobiliary: No solid liver abnormality is seen.  Unchanged low-attenuation liver lesions previously characterized as cysts by MR, requiring no further follow-up or characterization. No gallstones, gallbladder wall thickening, or biliary dilatation. Pancreas: Unremarkable. No pancreatic ductal dilatation or surrounding inflammatory changes. Spleen: Normal in size without significant abnormality. Adrenals/Urinary Tract: Adrenal glands are unremarkable. Kidneys are normal, without renal calculi, solid lesion, or hydronephrosis. Bladder is unremarkable. Stomach/Bowel: Unchanged postoperative appearance of Nissen fundoplication (series 301, image 48). Appendix appears normal. No evidence of bowel wall thickening, distention, or inflammatory changes. Sigmoid diverticulosis. Vascular/Lymphatic: Aortic atherosclerosis. No enlarged abdominal or pelvic lymph nodes. Reproductive: Status post hysterectomy. Other: No abdominal wall hernia or abnormality. No ascites. Musculoskeletal: No acute osseous findings. IMPRESSION: 1. No noncontrast CT evidence of malignancy or metastatic disease in the chest, abdomen, or pelvis. 2. Sigmoid diverticulosis without evidence of acute diverticulitis. 3. Unchanged postoperative appearance of Nissen fundoplication. 4. Status post hysterectomy. Aortic Atherosclerosis (ICD10-I70.0). Electronically Signed   By: Fredricka Jenny M.D.   On: 11/22/2023 09:55    Orders Placed This Encounter  Procedures   CBC with Differential (Cancer Center Only)    Standing Status:   Future    Expiration Date:   12/06/2024   CMP (Cancer Center only)    Standing Status:   Future    Expiration Date:   12/06/2024   D-dimer, quantitative    Standing Status:   Future    Expiration Date:   12/06/2024     Future Appointments  Date Time Provider Department Center  01/11/2024 10:15 AM Charity Conch, DPM [facility]  02/06/2024  8:15 AM [facility] PHLEBOTOMIST [facility] None  02/06/2024  8:45 AM Arlo Berber, MD [facility] None  07/16/2024  1:00 PM  Ronelle Coffee, MD [facility] None     This document was completed utilizing speech recognition software. Grammatical errors, random word insertions, pronoun errors, and incomplete sentences are an occasional consequence of this system due to software limitations, ambient noise, and hardware issues. Any formal questions or concerns about the content, text or information  contained within the body of this dictation should be directly addressed to the provider for clarification.

## 2023-12-12 DIAGNOSIS — G629 Polyneuropathy, unspecified: Secondary | ICD-10-CM

## 2023-12-12 DIAGNOSIS — I700 Atherosclerosis of aorta: Secondary | ICD-10-CM

## 2023-12-12 DIAGNOSIS — G609 Hereditary and idiopathic neuropathy, unspecified: Secondary | ICD-10-CM | POA: Diagnosis not present

## 2023-12-12 HISTORY — DX: Atherosclerosis of aorta: I70.0

## 2023-12-12 HISTORY — DX: Polyneuropathy, unspecified: G62.9

## 2023-12-14 DIAGNOSIS — H5212 Myopia, left eye: Secondary | ICD-10-CM | POA: Diagnosis not present

## 2023-12-14 DIAGNOSIS — H353132 Nonexudative age-related macular degeneration, bilateral, intermediate dry stage: Secondary | ICD-10-CM | POA: Diagnosis not present

## 2023-12-14 DIAGNOSIS — H524 Presbyopia: Secondary | ICD-10-CM | POA: Diagnosis not present

## 2023-12-14 DIAGNOSIS — Z961 Presence of intraocular lens: Secondary | ICD-10-CM | POA: Diagnosis not present

## 2023-12-14 DIAGNOSIS — H52223 Regular astigmatism, bilateral: Secondary | ICD-10-CM | POA: Diagnosis not present

## 2023-12-14 DIAGNOSIS — H5201 Hypermetropia, right eye: Secondary | ICD-10-CM | POA: Diagnosis not present

## 2023-12-29 DIAGNOSIS — H00015 Hordeolum externum left lower eyelid: Secondary | ICD-10-CM | POA: Diagnosis not present

## 2024-01-02 DIAGNOSIS — M5416 Radiculopathy, lumbar region: Secondary | ICD-10-CM | POA: Diagnosis not present

## 2024-01-02 DIAGNOSIS — M545 Low back pain, unspecified: Secondary | ICD-10-CM | POA: Diagnosis not present

## 2024-01-04 ENCOUNTER — Telehealth: Payer: Self-pay

## 2024-01-04 NOTE — Telephone Encounter (Signed)
 Pt called and is upset that she is being charged for the following studies: F2 Gene, F5 Gene, IMMUNOASSAY TUMOR CA125 all done on 10/26/2023. She has [employer]. She stated, I am not paying for those labs because no one told me I would have to pay for them. If I knew, I would never have allowed for those labs.  Patient was recommended to contact the [facility] billing dept at [phone] and patient agreed.

## 2024-01-08 DIAGNOSIS — R058 Other specified cough: Secondary | ICD-10-CM | POA: Diagnosis not present

## 2024-01-08 DIAGNOSIS — J209 Acute bronchitis, unspecified: Secondary | ICD-10-CM | POA: Diagnosis not present

## 2024-01-09 ENCOUNTER — Ambulatory Visit: Admitting: Podiatry

## 2024-01-11 ENCOUNTER — Ambulatory Visit: Admitting: Podiatry

## 2024-01-11 DIAGNOSIS — I10 Essential (primary) hypertension: Secondary | ICD-10-CM | POA: Diagnosis not present

## 2024-01-16 DIAGNOSIS — M5416 Radiculopathy, lumbar region: Secondary | ICD-10-CM | POA: Diagnosis not present

## 2024-01-24 DIAGNOSIS — I10 Essential (primary) hypertension: Secondary | ICD-10-CM | POA: Diagnosis not present

## 2024-01-24 DIAGNOSIS — Z7901 Long term (current) use of anticoagulants: Secondary | ICD-10-CM | POA: Diagnosis not present

## 2024-01-24 DIAGNOSIS — M4316 Spondylolisthesis, lumbar region: Secondary | ICD-10-CM | POA: Diagnosis not present

## 2024-01-24 DIAGNOSIS — M51362 Other intervertebral disc degeneration, lumbar region with discogenic back pain and lower extremity pain: Secondary | ICD-10-CM | POA: Diagnosis not present

## 2024-01-24 DIAGNOSIS — Z87891 Personal history of nicotine dependence: Secondary | ICD-10-CM | POA: Diagnosis not present

## 2024-01-24 DIAGNOSIS — I700 Atherosclerosis of aorta: Secondary | ICD-10-CM | POA: Diagnosis not present

## 2024-01-24 DIAGNOSIS — G629 Polyneuropathy, unspecified: Secondary | ICD-10-CM | POA: Diagnosis not present

## 2024-01-24 DIAGNOSIS — M810 Age-related osteoporosis without current pathological fracture: Secondary | ICD-10-CM | POA: Diagnosis not present

## 2024-01-29 DIAGNOSIS — M51362 Other intervertebral disc degeneration, lumbar region with discogenic back pain and lower extremity pain: Secondary | ICD-10-CM | POA: Diagnosis not present

## 2024-01-29 DIAGNOSIS — M810 Age-related osteoporosis without current pathological fracture: Secondary | ICD-10-CM | POA: Diagnosis not present

## 2024-01-29 DIAGNOSIS — G629 Polyneuropathy, unspecified: Secondary | ICD-10-CM | POA: Diagnosis not present

## 2024-01-29 DIAGNOSIS — I10 Essential (primary) hypertension: Secondary | ICD-10-CM | POA: Diagnosis not present

## 2024-01-29 DIAGNOSIS — I700 Atherosclerosis of aorta: Secondary | ICD-10-CM | POA: Diagnosis not present

## 2024-01-29 DIAGNOSIS — M4316 Spondylolisthesis, lumbar region: Secondary | ICD-10-CM | POA: Diagnosis not present

## 2024-01-29 DIAGNOSIS — Z7901 Long term (current) use of anticoagulants: Secondary | ICD-10-CM | POA: Diagnosis not present

## 2024-01-29 DIAGNOSIS — Z87891 Personal history of nicotine dependence: Secondary | ICD-10-CM | POA: Diagnosis not present

## 2024-01-30 ENCOUNTER — Ambulatory Visit (HOSPITAL_BASED_OUTPATIENT_CLINIC_OR_DEPARTMENT_OTHER)

## 2024-01-30 DIAGNOSIS — I82441 Acute embolism and thrombosis of right tibial vein: Secondary | ICD-10-CM

## 2024-01-31 DIAGNOSIS — I700 Atherosclerosis of aorta: Secondary | ICD-10-CM | POA: Diagnosis not present

## 2024-01-31 DIAGNOSIS — G629 Polyneuropathy, unspecified: Secondary | ICD-10-CM | POA: Diagnosis not present

## 2024-01-31 DIAGNOSIS — M810 Age-related osteoporosis without current pathological fracture: Secondary | ICD-10-CM | POA: Diagnosis not present

## 2024-01-31 DIAGNOSIS — M51362 Other intervertebral disc degeneration, lumbar region with discogenic back pain and lower extremity pain: Secondary | ICD-10-CM | POA: Diagnosis not present

## 2024-01-31 DIAGNOSIS — I10 Essential (primary) hypertension: Secondary | ICD-10-CM | POA: Diagnosis not present

## 2024-01-31 DIAGNOSIS — Z87891 Personal history of nicotine dependence: Secondary | ICD-10-CM | POA: Diagnosis not present

## 2024-01-31 DIAGNOSIS — M4316 Spondylolisthesis, lumbar region: Secondary | ICD-10-CM | POA: Diagnosis not present

## 2024-01-31 DIAGNOSIS — Z7901 Long term (current) use of anticoagulants: Secondary | ICD-10-CM | POA: Diagnosis not present

## 2024-02-05 DIAGNOSIS — M4316 Spondylolisthesis, lumbar region: Secondary | ICD-10-CM | POA: Diagnosis not present

## 2024-02-05 DIAGNOSIS — G629 Polyneuropathy, unspecified: Secondary | ICD-10-CM | POA: Diagnosis not present

## 2024-02-05 DIAGNOSIS — M51362 Other intervertebral disc degeneration, lumbar region with discogenic back pain and lower extremity pain: Secondary | ICD-10-CM | POA: Diagnosis not present

## 2024-02-05 DIAGNOSIS — I700 Atherosclerosis of aorta: Secondary | ICD-10-CM | POA: Diagnosis not present

## 2024-02-05 DIAGNOSIS — M810 Age-related osteoporosis without current pathological fracture: Secondary | ICD-10-CM | POA: Diagnosis not present

## 2024-02-05 DIAGNOSIS — Z7901 Long term (current) use of anticoagulants: Secondary | ICD-10-CM | POA: Diagnosis not present

## 2024-02-05 DIAGNOSIS — I10 Essential (primary) hypertension: Secondary | ICD-10-CM | POA: Diagnosis not present

## 2024-02-05 DIAGNOSIS — Z87891 Personal history of nicotine dependence: Secondary | ICD-10-CM | POA: Diagnosis not present

## 2024-02-06 ENCOUNTER — Inpatient Hospital Stay (HOSPITAL_BASED_OUTPATIENT_CLINIC_OR_DEPARTMENT_OTHER): Admitting: Oncology

## 2024-02-06 ENCOUNTER — Encounter: Payer: Self-pay | Admitting: Oncology

## 2024-02-06 ENCOUNTER — Other Ambulatory Visit

## 2024-02-06 ENCOUNTER — Ambulatory Visit: Admitting: Oncology

## 2024-02-06 ENCOUNTER — Inpatient Hospital Stay: Attending: Oncology

## 2024-02-06 VITALS — BP 147/67 | HR 56 | Resp 18 | Ht 62.0 in | Wt 120.3 lb

## 2024-02-06 DIAGNOSIS — Z888 Allergy status to other drugs, medicaments and biological substances status: Secondary | ICD-10-CM | POA: Diagnosis not present

## 2024-02-06 DIAGNOSIS — R5383 Other fatigue: Secondary | ICD-10-CM | POA: Insufficient documentation

## 2024-02-06 DIAGNOSIS — Z882 Allergy status to sulfonamides status: Secondary | ICD-10-CM | POA: Insufficient documentation

## 2024-02-06 DIAGNOSIS — R531 Weakness: Secondary | ICD-10-CM | POA: Insufficient documentation

## 2024-02-06 DIAGNOSIS — Z885 Allergy status to narcotic agent status: Secondary | ICD-10-CM | POA: Diagnosis not present

## 2024-02-06 DIAGNOSIS — I10 Essential (primary) hypertension: Secondary | ICD-10-CM | POA: Insufficient documentation

## 2024-02-06 DIAGNOSIS — Z8673 Personal history of transient ischemic attack (TIA), and cerebral infarction without residual deficits: Secondary | ICD-10-CM | POA: Diagnosis not present

## 2024-02-06 DIAGNOSIS — Z79899 Other long term (current) drug therapy: Secondary | ICD-10-CM | POA: Insufficient documentation

## 2024-02-06 DIAGNOSIS — Z86718 Personal history of other venous thrombosis and embolism: Secondary | ICD-10-CM | POA: Diagnosis not present

## 2024-02-06 DIAGNOSIS — Z881 Allergy status to other antibiotic agents status: Secondary | ICD-10-CM | POA: Insufficient documentation

## 2024-02-06 DIAGNOSIS — I82441 Acute embolism and thrombosis of right tibial vein: Secondary | ICD-10-CM

## 2024-02-06 DIAGNOSIS — Z880 Allergy status to penicillin: Secondary | ICD-10-CM | POA: Diagnosis not present

## 2024-02-06 DIAGNOSIS — Z85828 Personal history of other malignant neoplasm of skin: Secondary | ICD-10-CM | POA: Insufficient documentation

## 2024-02-06 DIAGNOSIS — K580 Irritable bowel syndrome with diarrhea: Secondary | ICD-10-CM | POA: Diagnosis not present

## 2024-02-06 DIAGNOSIS — Z886 Allergy status to analgesic agent status: Secondary | ICD-10-CM | POA: Diagnosis not present

## 2024-02-06 DIAGNOSIS — R519 Headache, unspecified: Secondary | ICD-10-CM | POA: Diagnosis not present

## 2024-02-06 DIAGNOSIS — R040 Epistaxis: Secondary | ICD-10-CM | POA: Insufficient documentation

## 2024-02-06 LAB — CMP (CANCER CENTER ONLY)
ALT: 15 U/L (ref 0–44)
AST: 21 U/L (ref 15–41)
Albumin: 4.1 g/dL (ref 3.5–5.0)
Alkaline Phosphatase: 56 U/L (ref 38–126)
Anion gap: 8 (ref 5–15)
BUN: 14 mg/dL (ref 8–23)
CO2: 25 mmol/L (ref 22–32)
Calcium: 9.4 mg/dL (ref 8.9–10.3)
Chloride: 99 mmol/L (ref 98–111)
Creatinine: 0.93 mg/dL (ref 0.44–1.00)
GFR, Estimated: 59 mL/min — ABNORMAL LOW (ref >60)
Glucose, Bld: 89 mg/dL (ref 70–99)
Potassium: 5.5 mmol/L — ABNORMAL HIGH (ref 3.5–5.1)
Sodium: 132 mmol/L — ABNORMAL LOW (ref 135–145)
Total Bilirubin: 0.4 mg/dL (ref 0.0–1.2)
Total Protein: 6.4 g/dL — ABNORMAL LOW (ref 6.5–8.1)

## 2024-02-06 LAB — CBC WITH DIFFERENTIAL (CANCER CENTER ONLY)
Abs Immature Granulocytes: 0.02 K/uL (ref 0.00–0.07)
Basophils Absolute: 0 K/uL (ref 0.0–0.1)
Basophils Relative: 1 %
Eosinophils Absolute: 0.1 K/uL (ref 0.0–0.5)
Eosinophils Relative: 2 %
HCT: 34.7 % — ABNORMAL LOW (ref 36.0–46.0)
Hemoglobin: 11.4 g/dL — ABNORMAL LOW (ref 12.0–15.0)
Immature Granulocytes: 0 %
Lymphocytes Relative: 25 %
Lymphs Abs: 1.8 K/uL (ref 0.7–4.0)
MCH: 30.7 pg (ref 26.0–34.0)
MCHC: 32.9 g/dL (ref 30.0–36.0)
MCV: 93.5 fL (ref 80.0–100.0)
Monocytes Absolute: 0.6 K/uL (ref 0.1–1.0)
Monocytes Relative: 9 %
Neutro Abs: 4.4 K/uL (ref 1.7–7.7)
Neutrophils Relative %: 63 %
Platelet Count: 255 K/uL (ref 150–400)
RBC: 3.71 MIL/uL — ABNORMAL LOW (ref 3.87–5.11)
RDW: 14.3 % (ref 11.5–15.5)
WBC Count: 7 K/uL (ref 4.0–10.5)
nRBC: 0 % (ref 0.0–0.2)

## 2024-02-06 LAB — D-DIMER, QUANTITATIVE: D-Dimer, Quant: <0.27 ug{FEU}/mL (ref 0.00–0.50)

## 2024-02-06 MED ORDER — APIXABAN 2.5 MG PO TABS: 2.5000 mg | ORAL_TABLET | Freq: Two times a day (BID) | ORAL | Status: AC

## 2024-02-06 NOTE — Assessment & Plan Note (Addendum)
 Deep vein thrombosis (DVT) in the right leg, identified after swelling. Reports decreased energy since December without significant weight loss, fevers, chills, or night sweats. Currently on Eliquis , but experienced epistaxis. Concerns about potential brain bleeding due to cavernous malformation. Blood work planned to determine underlying cause of DVT. Advised to continue full dose of Eliquis  despite epistaxis, as there is no test to measure blood thinning with Eliquis . Risks of reducing dosage discussed, including potential for clot persistence. Informed that brain bleeding would present with severe symptoms such as stroke-like symptoms, severe headaches, or syncope.  On her consultation with us  on 10/26/2023, we pursued hypercoagulable workup.  Prothrombin gene mutation, factor V Leiden mutation were both negative.  Beta-2  glycoprotein antibodies were negative.  Anticardiolipin IgM antibodies were weak positive.  This needs to be repeated at a later date.  Rest of the hypercoagulable workup was deferred given recent thrombosis which can make it falsely abnormal.  On 11/23/2023, MRI of bilateral knees showed meniscal tears in both knees.  This could have limited her mobility provoking the DVT.  Repeat ultrasound on 01/30/2024 showed no evidence of DVT.  She was advised to cut down Eliquis  dose to 2.5 mg p.o. twice daily and eventually stop it.  She would like to follow-up with her PCP before stopping the medication altogether.  She cannot take aspirin because of GI issues.  D-dimer was undetectable today.  - Monitor for signs of clot recurrence, such as leg swelling, pain, redness, chest pain, or dyspnea  Patient elected to continue follow-up with her PCP.  Will discharge her from our office.  Please reconsult us  as necessary.

## 2024-02-06 NOTE — Progress Notes (Signed)
 [facility]  HEMATOLOGY CLINIC PROGRESS NOTE  PATIENT NAME: Michelle Keith   MR#: [id number] DOB: [birth year]  Patient Care Team: Shayne Anes, MD as PCP - General (Internal Medicine) Lavona Agent, MD as PCP - Cardiology (Cardiology) Lavona Agent, MD as Consulting Physician (Cardiology) Lanis Fonda BRAVO, MD as Consulting Physician (Vascular Surgery)  Date of visit: 02/06/2024   ASSESSMENT & PLAN:   Michelle Keith is a 89 y.o.  lady with a past medical history of hypertension, irritable bowel syndrome, GERD, osteoporosis, squamous cell skin cancer, esophageal stricture, was referred to our service in May 2025 for evaluation of possible hypercoagulable state after she was found to have acute right lower extremity DVT in April 2025.  Grossly negative thrombophilia workup.    History of DVT of lower extremity Deep vein thrombosis (DVT) in the right leg, identified after swelling. Reports decreased energy since December without significant weight loss, fevers, chills, or night sweats. Currently on Eliquis , but experienced epistaxis. Concerns about potential brain bleeding due to cavernous malformation. Blood work planned to determine underlying cause of DVT. Advised to continue full dose of Eliquis  despite epistaxis, as there is no test to measure blood thinning with Eliquis . Risks of reducing dosage discussed, including potential for clot persistence. Informed that brain bleeding would present with severe symptoms such as stroke-like symptoms, severe headaches, or syncope.  On her consultation with us  on 10/26/2023, we pursued hypercoagulable workup.  Prothrombin gene mutation, factor V Leiden mutation were both negative.  Beta-2  glycoprotein antibodies were negative.  Anticardiolipin IgM antibodies were weak positive.  This needs to be repeated at a later date.  Rest of the hypercoagulable workup was deferred given recent thrombosis which can make it falsely abnormal.  On  11/23/2023, MRI of bilateral knees showed meniscal tears in both knees.  This could have limited her mobility provoking the DVT.  Repeat ultrasound on 01/30/2024 showed no evidence of DVT.  She was advised to cut down Eliquis  dose to 2.5 mg p.o. twice daily and eventually stop it.  She would like to follow-up with her PCP before stopping the medication altogether.  She cannot take aspirin because of GI issues.  D-dimer was undetectable today.  - Monitor for signs of clot recurrence, such as leg swelling, pain, redness, chest pain, or dyspnea  Patient elected to continue follow-up with her PCP.  Will discharge her from our office.  Please reconsult us  as necessary.   Meniscal tears in both knees MRI reveals meniscal tears in both knees, more prominent on the right. The tears do not cause significant pain but may limit mobility, potentially contributing to blood clot formation. Surgery is not recommended due to her age and lack of significant pain.  Diverticulosis CT scan indicates diverticulosis, which may contribute to constipation. Constipation management is necessary to prevent complications. - Recommend stool softeners to manage constipation. - Consider fiber supplements for constipation management.  Calcium and Prolia  treatment Discussed the importance of maintaining adequate calcium levels for bone health, especially in the context of Prolia  treatment.  - She has been taking calcium 1200 mg p.o. daily and calcium levels are currently within normal limits. - Continue vitamin D  supplementation.  I spent a total of 30 minutes during this encounter with the patient including review of chart and various tests results, discussions about plan of care and coordination of care plan.  I reviewed lab results and outside records for this visit and discussed relevant results with the patient. Diagnosis, plan  of care and treatment options were also discussed in detail with the patient. Opportunity  provided to ask questions and answers provided to her apparent satisfaction. Provided instructions to call our clinic with any problems, questions or concerns prior to return visit. I recommended to continue follow-up with PCP and sub-specialists. She verbalized understanding and agreed with the plan. No barriers to learning was detected.  [name], MD  02/06/2024 4:44 PM  [facility] - A DEPT OF [facility] [address] [place] [address] Dept: [phone] Dept Fax: [phone]   CHIEF COMPLAINT/ REASON FOR VISIT:  Follow-up for history of right lower extremity DVT.  Negative thrombophilia workup.  She does have meniscal tears in both knees, limiting mobility.  INTERVAL HISTORY:  Discussed the use of AI scribe software for clinical note transcription with the patient, who gave verbal consent to proceed.  History of Present Illness  Michelle Keith is an 89 year old female with a history of blood clots who presents for follow-up after treatment with blood thinners. She was referred by Dr. Shayne for management of her blood clotting issues.  In April 2025, she was diagnosed with a blood clot and started on Eliquis , which she has been taking for almost four months. She is concerned about the risk of future clots and the potential need for long-term anticoagulation therapy.  She has a history of a brain bleed and is worried about the risk of bleeding while on blood thinners. She recently experienced a headache and is apprehensive about discontinuing the medication due to the risk of recurrent clots.  Recent lab results show normal calcium levels at 9.4 mg/dL, slightly low total protein at 6.4 g/dL, and low sodium at 867 mmol/L. Potassium is slightly elevated at 5.5 mmol/L. She takes two calcium tablets daily, which has helped maintain her calcium levels.  She cannot take aspirin due to gastrointestinal side effects,  including stomach pain and diarrhea. She is considering reducing her Eliquis  dose due to concerns about bleeding and its impact on her kidneys.  Her GFR is slightly below normal at 59 mL/min/1.73 m.    SUMMARY OF HEMATOLOGIC HISTORY:  She had a blood clot in her right leg, initially discovered by her podiatrist after noticing swelling. A scan confirmed the presence of two clots. She is currently on Eliquis , a blood thinner, and has experienced epistaxis since starting the medication. She is concerned about the possibility of a brain bleed due to her history of cavernoma, identified in February 2025.   She has felt less energetic since around Christmas 2024, describing her symptoms as 'less energy' and noting a slight weight loss of three to four pounds. No fevers, chills, or night sweats. She has been experiencing constipation for the past three months, with occasional stomach pain and a sensation of tightness, but no diarrhea or blood in stools.   She has a history of a cavernoma in the brain, discovered in February 2025, and is scheduled for an MRI on Nov 02, 2023, to monitor this condition. She reports a slight headache but no significant vision problems, although her vision is not as clear as before. She recently saw a retina specialist who noted improvement in her eye condition.   She has noticed a bump on her knee, which she initially did not feel but now perceives, and suspects a similar smaller bump on the other knee. She reports no pain in the calf  or knee area.   She underwent a mammogram in August 2024, which was normal. Her abdomen was last scanned in October 2023, revealing diverticulosis, but no recent scans have been performed on her lower abdomen.   Currently on Eliquis , but experienced epistaxis. Concerns about potential brain bleeding due to cavernous malformation. Blood work planned to determine underlying cause of DVT. Advised to continue full dose of Eliquis  despite epistaxis,  as there is no test to measure blood thinning with Eliquis . Risks of reducing dosage discussed, including potential for clot persistence. Informed that brain bleeding would present with severe symptoms such as stroke-like symptoms, severe headaches, or syncope.   Continue Eliquis  at full dose for 3-6 months   On her consultation with us  on 10/26/2023, we pursued hypercoagulable workup.  Prothrombin gene mutation, factor V Leiden mutation were both negative.  Beta-2  glycoprotein antibodies were negative.  Anticardiolipin IgM antibodies were weak positive.  This needs to be repeated at a later date.  Rest of the hypercoagulable workup was deferred given recent thrombosis which can make it falsely abnormal.  On 11/02/2023, MRI of the brain showed stable small cavernoma within the right corona radiata.  No evidence of bleeding.  On 11/21/2023, CT chest, abdomen and pelvis was obtained for further evaluation of her unexplained weight loss.  No signs of malignancy.  On 11/23/2023, MRI of bilateral knees showed meniscal tears in both knees.  This could have limited her mobility provoking the DVT.  Repeat ultrasound on 01/30/2024 showed no evidence of DVT.  She was advised to cut down Eliquis  dose to 2.5 mg p.o. twice daily and eventually stop it.  She would like to follow-up with her PCP before stopping the medication altogether.  She cannot take aspirin because of GI issues.  I have reviewed the past medical history, past surgical history, social history and family history with the patient and they are unchanged from previous note.  ALLERGIES: She is allergic to epinephrine, lidocaine, aspirin, atenolol, clarithromycin, cortisone, hydrocodone, norvasc  [amlodipine ], omeprazole magnesium, penicillins, pindolol , pseudoephedrine-guaifenesin, sulfonamide derivatives, tetracycline, xifaxan  [rifaximin ], glycol stearate, tramadol , and valsartan.  MEDICATIONS:  Current Outpatient Medications  Medication Sig Dispense  Refill   Calcium-Vitamin D -Vitamin K (VIACTIV PO) Take 1 tablet by mouth 2 (two) times daily.     Cholecalciferol  (VITAMIN D ) 50 MCG (2000 UT) tablet Take 2,000 Units by mouth daily.     denosumab  (PROLIA ) 60 MG/ML SOSY injection Inject 60 mg into the skin once. 2 times a year     metoprolol  tartrate (LOPRESSOR ) 25 MG tablet Take 1 tablet (25 mg total) by mouth 2 (two) times daily. 60 tablet 5   metoprolol  tartrate (LOPRESSOR ) 25 MG tablet Take 25 mg by mouth 2 (two) times daily.     pantoprazole  (PROTONIX ) 40 MG tablet Take 40 mg by mouth every other day. Patient states she takes it every other day prn     valACYclovir (VALTREX) 500 MG tablet Take 500 mg by mouth as needed (fever blisters).     zolpidem  (AMBIEN ) 10 MG tablet Take 2.5 mg by mouth daily as needed for sleep.      apixaban  (ELIQUIS ) 2.5 MG TABS tablet Take 1 tablet (2.5 mg total) by mouth 2 (two) times daily.     No current facility-administered medications for this visit.     REVIEW OF SYSTEMS:    Review of Systems - Oncology  All other pertinent systems were reviewed with the patient and are negative.  PHYSICAL EXAMINATION:    Onc  Performance Status - 02/06/24 1109       ECOG Perf Status   ECOG Perf Status Restricted in physically strenuous activity but ambulatory and able to carry out work of a light or sedentary nature, e.g., light house work, office work      KPS SCALE   KPS % SCORE Normal activity with effort, some s/s of disease          Vitals:   02/06/24 1053 02/06/24 1059  BP: (!) 179/73 (!) 147/67  Pulse: (!) 56   Resp: 18   SpO2: 99%     Filed Weights   02/06/24 1053  Weight: 120 lb 4.8 oz (54.6 kg)     Physical Exam Constitutional:      General: She is not in acute distress.    Appearance: Normal appearance.  HENT:     Head: Normocephalic and atraumatic.  Cardiovascular:     Rate and Rhythm: Normal rate.  Pulmonary:     Effort: Pulmonary effort is normal. No respiratory distress.   Abdominal:     General: There is no distension.  Neurological:     General: No focal deficit present.     Mental Status: She is alert and oriented to person, place, and time.  Psychiatric:        Mood and Affect: Mood normal.        Behavior: Behavior normal.      LABORATORY DATA:   I have reviewed the data as listed.  Results for orders placed or performed in visit on 02/06/24  D-dimer, quantitative  Result Value Ref Range   D-Dimer, Quant <0.27 0.00 - 0.50 ug/mL-FEU  CMP (Cancer Center only)  Result Value Ref Range   Sodium 132 (L) 135 - 145 mmol/L   Potassium 5.5 (H) 3.5 - 5.1 mmol/L   Chloride 99 98 - 111 mmol/L   CO2 25 22 - 32 mmol/L   Glucose, Bld 89 70 - 99 mg/dL   BUN 14 8 - 23 mg/dL   Creatinine 9.06 9.55 - 1.00 mg/dL   Calcium 9.4 8.9 - 89.6 mg/dL   Total Protein 6.4 (L) 6.5 - 8.1 g/dL   Albumin 4.1 3.5 - 5.0 g/dL   AST 21 15 - 41 U/L   ALT 15 0 - 44 U/L   Alkaline Phosphatase 56 38 - 126 U/L   Total Bilirubin 0.4 0.0 - 1.2 mg/dL   GFR, Estimated 59 (L) >60 mL/min   Anion gap 8 5 - 15  CBC with Differential (Cancer Center Only)  Result Value Ref Range   WBC Count 7.0 4.0 - 10.5 K/uL   RBC 3.71 (L) 3.87 - 5.11 MIL/uL   Hemoglobin 11.4 (L) 12.0 - 15.0 g/dL   HCT 65.2 (L) 63.9 - 53.9 %   MCV 93.5 80.0 - 100.0 fL   MCH 30.7 26.0 - 34.0 pg   MCHC 32.9 30.0 - 36.0 g/dL   RDW 85.6 88.4 - 84.4 %   Platelet Count 255 150 - 400 K/uL   nRBC 0.0 0.0 - 0.2 %   Neutrophils Relative % 63 %   Neutro Abs 4.4 1.7 - 7.7 K/uL   Lymphocytes Relative 25 %   Lymphs Abs 1.8 0.7 - 4.0 K/uL   Monocytes Relative 9 %   Monocytes Absolute 0.6 0.1 - 1.0 K/uL   Eosinophils Relative 2 %   Eosinophils Absolute 0.1 0.0 - 0.5 K/uL   Basophils Relative 1 %   Basophils Absolute 0.0 0.0 - 0.1  K/uL   Immature Granulocytes 0 %   Abs Immature Granulocytes 0.02 0.00 - 0.07 K/uL     RADIOGRAPHIC STUDIES:  I have personally reviewed the radiological images as listed and agree with  the findings in the report.  VAS US  LOWER EXTREMITY VENOUS (DVT) Result Date: 01/31/2024  Lower Venous DVT Study Patient Name:  JENNIAH BHAVSAR  Date of Exam:   01/30/2024 Medical Rec #: [id number]      Accession #:    [id number] Date of Birth: 1931/10/11     Patient Gender: F Patient Age:   44 years Exam Location:  [facility] Procedure:      VAS US  LOWER EXTREMITY VENOUS (DVT) Referring Phys: CHINITA Camry Theiss --------------------------------------------------------------------------------  Indications: Swelling. Other Indications: Patient denies any chest pain or shortness of breath. Patient                    has history of acute DVT in right peroneal and PTV. Anticoagulation: Xarelto. Comparison Study: 10/05/2023- Findings consistent with acute deep vein                   thrombosis involving the right posterior tibial veins, and                   right peroneal veins. Performing Technologist: Orvil Holmes RDCS  Examination Guidelines: A complete evaluation includes B-mode imaging, spectral Doppler, color Doppler, and power Doppler as needed of all accessible portions of each vessel. Bilateral testing is considered an integral part of a complete examination. Limited examinations for reoccurring indications may be performed as noted. The reflux portion of the exam is performed with the patient in reverse Trendelenburg.  +---------+---------------+---------+-----------+----------+--------------+ RIGHT    CompressibilityPhasicitySpontaneityPropertiesThrombus Aging +---------+---------------+---------+-----------+----------+--------------+ CFV      Full           Yes      Yes                                 +---------+---------------+---------+-----------+----------+--------------+ SFJ      Full           Yes      Yes                                 +---------+---------------+---------+-----------+----------+--------------+ FV Prox  Full           Yes      Yes                                  +---------+---------------+---------+-----------+----------+--------------+ FV Mid   Full           Yes      Yes                                 +---------+---------------+---------+-----------+----------+--------------+ FV DistalFull           Yes      Yes                                 +---------+---------------+---------+-----------+----------+--------------+ PFV      Full                                                        +---------+---------------+---------+-----------+----------+--------------+  POP      Full           Yes      Yes                                 +---------+---------------+---------+-----------+----------+--------------+ PTV      Full           Yes      Yes                                 +---------+---------------+---------+-----------+----------+--------------+ PERO     Full           Yes      Yes                                 +---------+---------------+---------+-----------+----------+--------------+ Gastroc  Full                                                        +---------+---------------+---------+-----------+----------+--------------+ GSV      Full           Yes      Yes                                 +---------+---------------+---------+-----------+----------+--------------+   +----+---------------+---------+-----------+----------+--------------+ LEFTCompressibilityPhasicitySpontaneityPropertiesThrombus Aging +----+---------------+---------+-----------+----------+--------------+ CFV Full           Yes      Yes                                 +----+---------------+---------+-----------+----------+--------------+    Findings reported to Dr. Vincenzo at 10:45.  Summary: RIGHT: - No evidence of deep vein thrombosis in the lower extremity. No indirect evidence of obstruction proximal to the inguinal ligament.  - No cystic structure found in the popliteal fossa.  LEFT: - No evidence of common femoral vein  obstruction.   *See table(s) above for measurements and observations. Electronically signed by Penne Colorado MD on 01/31/2024 at 4:46:03 PM.    Final     No orders of the defined types were placed in this encounter.    Future Appointments  Date Time Provider Department Center  02/13/2024  1:15 PM Gershon Donnice SAUN[facility]ERS, [place] [facility]  04/09/2024 10:30 AM [facility] None  05/07/2024 10:30 AM Ines Onetha NOVAK, [place] [facility] None  07/16/2024  1:00 PM Valdemar Rogue, [place] [facility] None     This document was completed utilizing speech recognition software. Grammatical errors, random word insertions, pronoun errors, and incomplete sentences are an occasional consequence of this system due to software limitations, ambient noise, and hardware issues. Any formal questions or concerns about the content, text or information contained within the body of this dictation should be directly addressed to the provider for clarification.

## 2024-02-07 DIAGNOSIS — M51362 Other intervertebral disc degeneration, lumbar region with discogenic back pain and lower extremity pain: Secondary | ICD-10-CM | POA: Diagnosis not present

## 2024-02-07 DIAGNOSIS — I10 Essential (primary) hypertension: Secondary | ICD-10-CM | POA: Diagnosis not present

## 2024-02-07 DIAGNOSIS — I700 Atherosclerosis of aorta: Secondary | ICD-10-CM | POA: Diagnosis not present

## 2024-02-07 DIAGNOSIS — Z7901 Long term (current) use of anticoagulants: Secondary | ICD-10-CM | POA: Diagnosis not present

## 2024-02-07 DIAGNOSIS — M810 Age-related osteoporosis without current pathological fracture: Secondary | ICD-10-CM | POA: Diagnosis not present

## 2024-02-07 DIAGNOSIS — G629 Polyneuropathy, unspecified: Secondary | ICD-10-CM | POA: Diagnosis not present

## 2024-02-07 DIAGNOSIS — Z87891 Personal history of nicotine dependence: Secondary | ICD-10-CM | POA: Diagnosis not present

## 2024-02-07 DIAGNOSIS — M4316 Spondylolisthesis, lumbar region: Secondary | ICD-10-CM | POA: Diagnosis not present

## 2024-02-12 DIAGNOSIS — Z87891 Personal history of nicotine dependence: Secondary | ICD-10-CM | POA: Diagnosis not present

## 2024-02-12 DIAGNOSIS — M4316 Spondylolisthesis, lumbar region: Secondary | ICD-10-CM | POA: Diagnosis not present

## 2024-02-12 DIAGNOSIS — G629 Polyneuropathy, unspecified: Secondary | ICD-10-CM | POA: Diagnosis not present

## 2024-02-12 DIAGNOSIS — I10 Essential (primary) hypertension: Secondary | ICD-10-CM | POA: Diagnosis not present

## 2024-02-12 DIAGNOSIS — M810 Age-related osteoporosis without current pathological fracture: Secondary | ICD-10-CM | POA: Diagnosis not present

## 2024-02-12 DIAGNOSIS — M51362 Other intervertebral disc degeneration, lumbar region with discogenic back pain and lower extremity pain: Secondary | ICD-10-CM | POA: Diagnosis not present

## 2024-02-12 DIAGNOSIS — Z7901 Long term (current) use of anticoagulants: Secondary | ICD-10-CM | POA: Diagnosis not present

## 2024-02-12 DIAGNOSIS — I700 Atherosclerosis of aorta: Secondary | ICD-10-CM | POA: Diagnosis not present

## 2024-02-13 ENCOUNTER — Ambulatory Visit (INDEPENDENT_AMBULATORY_CARE_PROVIDER_SITE_OTHER): Admitting: Podiatry

## 2024-02-13 ENCOUNTER — Encounter: Payer: Self-pay | Admitting: Podiatry

## 2024-02-13 DIAGNOSIS — H6123 Impacted cerumen, bilateral: Secondary | ICD-10-CM | POA: Diagnosis not present

## 2024-02-13 DIAGNOSIS — M79675 Pain in left toe(s): Secondary | ICD-10-CM | POA: Diagnosis not present

## 2024-02-13 DIAGNOSIS — M79674 Pain in right toe(s): Secondary | ICD-10-CM

## 2024-02-13 DIAGNOSIS — B351 Tinea unguium: Secondary | ICD-10-CM

## 2024-02-14 NOTE — Progress Notes (Signed)
 Subjective: Chief Complaint  Patient presents with   Nail Problem    He usually cuts my nails.    89 year old female presents for the above concerns.  States that she has been doing well except her nails are thickened elongated she has difficulty trimming them herself.  No open lesions or any changes otherwise.   She is on blood thinners for the recent DVT.   Objective: AAO x3, NAD DP/PT pulses palpable bilaterally, CRT less than 3 seconds Nails are hypertrophic, dystrophic, brittle, discolored, elongated 10. No surrounding redness or drainage. Tenderness nails 1-5 bilaterally. No open lesions or pre-ulcerative lesions are identified today. No pain with calf compression, erythema or warmth.   Assessment: Symptomatic onychomycosis  Plan: Symptomatic onychomycosis -Sharply debrided nails x 10 without any complications or bleeding.  She is continue with tea tree oil to the nail.  -Moisturizer for the feet daily.  -Daily foot inspection.   Michelle Keith DPM

## 2024-02-23 DIAGNOSIS — M4316 Spondylolisthesis, lumbar region: Secondary | ICD-10-CM | POA: Diagnosis not present

## 2024-02-23 DIAGNOSIS — Z87891 Personal history of nicotine dependence: Secondary | ICD-10-CM | POA: Diagnosis not present

## 2024-02-23 DIAGNOSIS — M810 Age-related osteoporosis without current pathological fracture: Secondary | ICD-10-CM | POA: Diagnosis not present

## 2024-02-23 DIAGNOSIS — Z7901 Long term (current) use of anticoagulants: Secondary | ICD-10-CM | POA: Diagnosis not present

## 2024-02-23 DIAGNOSIS — G629 Polyneuropathy, unspecified: Secondary | ICD-10-CM | POA: Diagnosis not present

## 2024-02-23 DIAGNOSIS — I10 Essential (primary) hypertension: Secondary | ICD-10-CM | POA: Diagnosis not present

## 2024-02-23 DIAGNOSIS — I700 Atherosclerosis of aorta: Secondary | ICD-10-CM | POA: Diagnosis not present

## 2024-02-28 DIAGNOSIS — I10 Essential (primary) hypertension: Secondary | ICD-10-CM | POA: Diagnosis not present

## 2024-02-28 DIAGNOSIS — M51362 Other intervertebral disc degeneration, lumbar region with discogenic back pain and lower extremity pain: Secondary | ICD-10-CM | POA: Diagnosis not present

## 2024-02-28 DIAGNOSIS — M810 Age-related osteoporosis without current pathological fracture: Secondary | ICD-10-CM | POA: Diagnosis not present

## 2024-02-28 DIAGNOSIS — M4316 Spondylolisthesis, lumbar region: Secondary | ICD-10-CM | POA: Diagnosis not present

## 2024-02-28 DIAGNOSIS — Z7901 Long term (current) use of anticoagulants: Secondary | ICD-10-CM | POA: Diagnosis not present

## 2024-02-28 DIAGNOSIS — G629 Polyneuropathy, unspecified: Secondary | ICD-10-CM | POA: Diagnosis not present

## 2024-02-28 DIAGNOSIS — I700 Atherosclerosis of aorta: Secondary | ICD-10-CM | POA: Diagnosis not present

## 2024-02-28 DIAGNOSIS — Z87891 Personal history of nicotine dependence: Secondary | ICD-10-CM | POA: Diagnosis not present

## 2024-03-01 DIAGNOSIS — M810 Age-related osteoporosis without current pathological fracture: Secondary | ICD-10-CM

## 2024-03-01 HISTORY — DX: Age-related osteoporosis without current pathological fracture: M81.0

## 2024-03-05 ENCOUNTER — Ambulatory Visit: Admitting: Podiatry

## 2024-03-05 DIAGNOSIS — M792 Neuralgia and neuritis, unspecified: Secondary | ICD-10-CM | POA: Diagnosis not present

## 2024-03-05 DIAGNOSIS — L820 Inflamed seborrheic keratosis: Secondary | ICD-10-CM | POA: Diagnosis not present

## 2024-03-05 DIAGNOSIS — M25559 Pain in unspecified hip: Secondary | ICD-10-CM | POA: Diagnosis not present

## 2024-03-07 DIAGNOSIS — M810 Age-related osteoporosis without current pathological fracture: Secondary | ICD-10-CM | POA: Diagnosis not present

## 2024-03-07 DIAGNOSIS — Z87891 Personal history of nicotine dependence: Secondary | ICD-10-CM | POA: Diagnosis not present

## 2024-03-07 DIAGNOSIS — Z7901 Long term (current) use of anticoagulants: Secondary | ICD-10-CM | POA: Diagnosis not present

## 2024-03-07 DIAGNOSIS — I700 Atherosclerosis of aorta: Secondary | ICD-10-CM | POA: Diagnosis not present

## 2024-03-07 DIAGNOSIS — M4316 Spondylolisthesis, lumbar region: Secondary | ICD-10-CM | POA: Diagnosis not present

## 2024-03-07 DIAGNOSIS — M51362 Other intervertebral disc degeneration, lumbar region with discogenic back pain and lower extremity pain: Secondary | ICD-10-CM | POA: Diagnosis not present

## 2024-03-07 DIAGNOSIS — G629 Polyneuropathy, unspecified: Secondary | ICD-10-CM | POA: Diagnosis not present

## 2024-03-07 DIAGNOSIS — I10 Essential (primary) hypertension: Secondary | ICD-10-CM | POA: Diagnosis not present

## 2024-03-14 DIAGNOSIS — G629 Polyneuropathy, unspecified: Secondary | ICD-10-CM | POA: Diagnosis not present

## 2024-03-14 DIAGNOSIS — M51362 Other intervertebral disc degeneration, lumbar region with discogenic back pain and lower extremity pain: Secondary | ICD-10-CM | POA: Diagnosis not present

## 2024-03-14 DIAGNOSIS — M4316 Spondylolisthesis, lumbar region: Secondary | ICD-10-CM | POA: Diagnosis not present

## 2024-03-14 DIAGNOSIS — I10 Essential (primary) hypertension: Secondary | ICD-10-CM | POA: Diagnosis not present

## 2024-03-14 DIAGNOSIS — Z7901 Long term (current) use of anticoagulants: Secondary | ICD-10-CM | POA: Diagnosis not present

## 2024-03-14 DIAGNOSIS — Z87891 Personal history of nicotine dependence: Secondary | ICD-10-CM | POA: Diagnosis not present

## 2024-03-14 DIAGNOSIS — M810 Age-related osteoporosis without current pathological fracture: Secondary | ICD-10-CM | POA: Diagnosis not present

## 2024-03-14 DIAGNOSIS — I700 Atherosclerosis of aorta: Secondary | ICD-10-CM | POA: Diagnosis not present

## 2024-03-21 DIAGNOSIS — M4316 Spondylolisthesis, lumbar region: Secondary | ICD-10-CM | POA: Diagnosis not present

## 2024-03-21 DIAGNOSIS — I700 Atherosclerosis of aorta: Secondary | ICD-10-CM | POA: Diagnosis not present

## 2024-03-21 DIAGNOSIS — G629 Polyneuropathy, unspecified: Secondary | ICD-10-CM | POA: Diagnosis not present

## 2024-03-21 DIAGNOSIS — M810 Age-related osteoporosis without current pathological fracture: Secondary | ICD-10-CM | POA: Diagnosis not present

## 2024-03-21 DIAGNOSIS — Z7901 Long term (current) use of anticoagulants: Secondary | ICD-10-CM | POA: Diagnosis not present

## 2024-03-21 DIAGNOSIS — I10 Essential (primary) hypertension: Secondary | ICD-10-CM | POA: Diagnosis not present

## 2024-03-21 DIAGNOSIS — M51362 Other intervertebral disc degeneration, lumbar region with discogenic back pain and lower extremity pain: Secondary | ICD-10-CM | POA: Diagnosis not present

## 2024-03-21 DIAGNOSIS — Z87891 Personal history of nicotine dependence: Secondary | ICD-10-CM | POA: Diagnosis not present

## 2024-04-02 ENCOUNTER — Telehealth (HOSPITAL_COMMUNITY): Payer: Self-pay | Admitting: Pharmacy Technician

## 2024-04-02 DIAGNOSIS — I10 Essential (primary) hypertension: Secondary | ICD-10-CM | POA: Diagnosis not present

## 2024-04-02 DIAGNOSIS — Z1389 Encounter for screening for other disorder: Secondary | ICD-10-CM | POA: Diagnosis not present

## 2024-04-02 DIAGNOSIS — I82441 Acute embolism and thrombosis of right tibial vein: Secondary | ICD-10-CM | POA: Diagnosis not present

## 2024-04-02 DIAGNOSIS — D649 Anemia, unspecified: Secondary | ICD-10-CM | POA: Diagnosis not present

## 2024-04-02 NOTE — Telephone Encounter (Addendum)
 NOTE: Faxed denial letter to Dr Perini's office. Spoke with nurse and she asked me to fax the denial and Dr Shayne would try to do a peer to peer to get approved as he did not want to change to biosimilar since she is allergic to a lot of medications (18 noted in Epic). Faxed denial letter and will wait to hear if denial is overturned.   10/17: Appeal to overturn denial has been approved. Letter has been scanned into the media tab.   Auth Submission: APPROVED Site of care: [facility] Payer: [employer] MEDICARE Medication & CPT/J Code(s) submitted: Prolia  (Denosumab ) [id number] Diagnosis Code: M81.0 Route of submission (phone, fax, portal):  Phone # Fax # Auth type: Buy/Bill HB Units/visits requested: 60mg  x 2 doses, q 6 months Reference number: [id number] Approval from: 04/05/2024 to 06/19/24    Michelle Keith, CPhT [facility] Phone: [phone] 04/05/2024

## 2024-04-02 NOTE — Progress Notes (Signed)
 [facility] - [place] [facility]   Patient Name: Michelle Keith   Patient DOB: May 17, 1932                Patient Age: 89 y.o.     Reason for Visit:  Hedi wears two [employer] RIC 7 hearing aids.  Today, Karleigh reports that she would like her aids connected to her phone.  She also feels the left dome is too big.   Procedure:   I changed her left dome to small closed.  She will try it and if she likes it I will repeat live speech mapping when she returns in December.  She was given extra domes.  I paired her phone with her aids and we discussed how to use the app.    Billing: No charge per [employer].

## 2024-04-09 ENCOUNTER — Inpatient Hospital Stay (HOSPITAL_COMMUNITY): Admission: RE | Admit: 2024-04-09 | Source: Ambulatory Visit

## 2024-04-09 DIAGNOSIS — M810 Age-related osteoporosis without current pathological fracture: Secondary | ICD-10-CM

## 2024-04-17 ENCOUNTER — Telehealth: Payer: Self-pay | Admitting: Cardiology

## 2024-04-17 ENCOUNTER — Ambulatory Visit (HOSPITAL_COMMUNITY)
Admission: RE | Admit: 2024-04-17 | Discharge: 2024-04-17 | Disposition: A | Discharge status: Home / Self Care | Source: Ambulatory Visit | Attending: Cardiology | Admitting: Cardiology

## 2024-04-17 DIAGNOSIS — R609 Edema, unspecified: Secondary | ICD-10-CM

## 2024-04-17 NOTE — Telephone Encounter (Signed)
 Spoke with daughter and patient, her left leg below the knee is bigger than the right leg.  She reports it is also warmer than the right. She had clots in the past and her medications were decreased and now she is afraid she has another clot. Venous dopplers scheduled this afternoon.

## 2024-04-17 NOTE — Telephone Encounter (Signed)
 Pt c/o swelling/edema: STAT if pt has developed SOB within 24 hours  If swelling, where is the swelling located? Legs - daughter asked if they can come have an ultrasound to check for clots   How much weight have you gained and in what time span? None   Have you gained 2 pounds in a day or 5 pounds in a week? No   Do you have a log of your daily weights (if so, list)? No   Are you currently taking a fluid pill? No   Are you currently SOB? No   Have you traveled recently in a car or plane for an extended period of time? No

## 2024-04-21 ENCOUNTER — Ambulatory Visit: Payer: Self-pay | Admitting: Cardiology

## 2024-04-23 ENCOUNTER — Ambulatory Visit (HOSPITAL_COMMUNITY)
Admission: RE | Admit: 2024-04-23 | Discharge: 2024-04-23 | Disposition: A | Discharge status: Home / Self Care | Source: Ambulatory Visit | Attending: Internal Medicine | Admitting: Internal Medicine

## 2024-04-23 DIAGNOSIS — M810 Age-related osteoporosis without current pathological fracture: Secondary | ICD-10-CM | POA: Insufficient documentation

## 2024-04-23 MED ORDER — DENOSUMAB 60 MG/ML ~~LOC~~ SOSY
60.0000 mg | PREFILLED_SYRINGE | Freq: Once | SUBCUTANEOUS | Status: AC
  Administered 2024-04-23: 60 mg via SUBCUTANEOUS

## 2024-04-23 MED ORDER — DENOSUMAB 60 MG/ML ~~LOC~~ SOSY
PREFILLED_SYRINGE | SUBCUTANEOUS | Status: AC
  Filled 2024-04-23: qty 1

## 2024-04-27 DIAGNOSIS — M25569 Pain in unspecified knee: Secondary | ICD-10-CM

## 2024-04-27 HISTORY — DX: Pain in unspecified knee: M25.569

## 2024-04-30 DIAGNOSIS — M7120 Synovial cyst of popliteal space [Baker], unspecified knee: Secondary | ICD-10-CM | POA: Diagnosis not present

## 2024-04-30 DIAGNOSIS — M5416 Radiculopathy, lumbar region: Secondary | ICD-10-CM | POA: Diagnosis not present

## 2024-04-30 DIAGNOSIS — K080 Exfoliation of teeth due to systemic causes: Secondary | ICD-10-CM | POA: Diagnosis not present

## 2024-05-03 DIAGNOSIS — M5416 Radiculopathy, lumbar region: Secondary | ICD-10-CM | POA: Diagnosis not present

## 2024-05-06 ENCOUNTER — Ambulatory Visit: Admitting: Neurology

## 2024-05-07 ENCOUNTER — Ambulatory Visit: Admitting: Neurology

## 2024-05-07 DIAGNOSIS — M51362 Other intervertebral disc degeneration, lumbar region with discogenic back pain and lower extremity pain: Secondary | ICD-10-CM | POA: Diagnosis not present

## 2024-05-07 DIAGNOSIS — M5416 Radiculopathy, lumbar region: Secondary | ICD-10-CM | POA: Diagnosis not present

## 2024-05-07 DIAGNOSIS — M48062 Spinal stenosis, lumbar region with neurogenic claudication: Secondary | ICD-10-CM | POA: Diagnosis not present

## 2024-05-11 DIAGNOSIS — M5416 Radiculopathy, lumbar region: Secondary | ICD-10-CM | POA: Diagnosis not present

## 2024-05-14 ENCOUNTER — Encounter (INDEPENDENT_AMBULATORY_CARE_PROVIDER_SITE_OTHER): Admitting: Ophthalmology

## 2024-05-14 NOTE — Progress Notes (Signed)
 [facility] - Clinic Note  05/28/2024     CHIEF COMPLAINT Patient presents for Retina Follow Up   HISTORY OF PRESENT ILLNESS: Michelle Keith is a 89 y.o. female who presents to the clinic today for:  HPI     Retina Follow Up   Patient presents with  Dry AMD.  In both eyes.  This started 7 years ago.  Duration of 8 months.  I, the attending physician,  performed the HPI with the patient and updated documentation appropriately.        Comments   Patient here for 8 months retina follow up for non exu ARMD OU. Pt states she got new glasses. Pt states she was driving about a month ago and complains not being able to see while driving all of a sudden. Pt states everything doesn't seem clear over the past month. Pt c/o floaters. Pt c/o ocular disturbance that comes and goes OU- claims its a sliver ball that last for a few seconds and then makes her sick after occurrence- Has been happening for around 6 months. Pt uses AT's PRN. Pt c/o not being able to read clearly.       Last edited by Valdemar Rogue, MD on 06/02/2024  2:02 AM.     Patient states she  doesn't feel she's seeing as well. Was driving not too long ago and suddenly couldn't see well.  Pt diagnosed w/ blood clots in her legs, on Eliquis  currently. Pt uses theratears TID OU.   Referring physician: Shayne Anes, MD [address] [place],  [place] [address]  HISTORICAL INFORMATION:   Selected notes from the MEDICAL RECORD NUMBER Referred by Dr. Camillo for evaluation of AMD OU   CURRENT MEDICATIONS: No current outpatient medications on file. (Ophthalmic Drugs)   No current facility-administered medications for this visit. (Ophthalmic Drugs)   Current Outpatient Medications (Other)  Medication Sig   apixaban  (ELIQUIS ) 2.5 MG TABS tablet Take 1 tablet (2.5 mg total) by mouth 2 (two) times daily. (Patient taking differently: Take 1.25 mg by mouth 2 (two) times daily.)   Calcium-Vitamin D -Vitamin K  (VIACTIV PO) Take 1 tablet by mouth 2 (two) times daily.   Cholecalciferol  (VITAMIN D ) 50 MCG (2000 UT) tablet Take 2,000 Units by mouth daily.   denosumab  (PROLIA ) 60 MG/ML SOSY injection Inject 60 mg into the skin once. 2 times a year   metoprolol  tartrate (LOPRESSOR ) 25 MG tablet Take 1 tablet (25 mg total) by mouth 2 (two) times daily.   metoprolol  tartrate (LOPRESSOR ) 25 MG tablet Take 25 mg by mouth 2 (two) times daily.   pantoprazole  (PROTONIX ) 40 MG tablet Take 40 mg by mouth every other day. Patient states she takes it every other day prn   valACYclovir (VALTREX) 500 MG tablet Take 500 mg by mouth as needed (fever blisters).   zolpidem  (AMBIEN ) 10 MG tablet Take 2.5 mg by mouth daily as needed for sleep.    No current facility-administered medications for this visit. (Other)   REVIEW OF SYSTEMS:   ALLERGIES Allergies  Allergen Reactions   Epinephrine Anaphylaxis   Lidocaine Anaphylaxis   Aspirin    Atenolol    Clarithromycin    Cortisone Other (See Comments)   Hydrocodone Nausea And Vomiting   Norvasc  [Amlodipine ] Other (See Comments)    headaches   Omeprazole Magnesium Other (See Comments)    Ask at office visit   Penicillins    Pindolol  Diarrhea    Fatigue, hip pain  Pseudoephedrine-Guaifenesin Other (See Comments)   Sulfonamide Derivatives    Tetracycline Hives   Xifaxan  [Rifaximin ]    Glycol Stearate Palpitations   Tramadol  Palpitations   Valsartan Palpitations   PAST MEDICAL HISTORY Past Medical History:  Diagnosis Date   Dysautonomia (HCC)    Esophageal stricture    GERD (gastroesophageal reflux disease)    Hiatal hernia    Hypertension    IBS (irritable bowel syndrome)    Ileus (HCC) 11/25/2016   Macular degeneration    Dry OU   Osteopenia    Osteoporosis    Right thyroid  nodule    Squamous cell skin cancer    Syncope    SYNCOPE 07/10/2009   Qualifier: Diagnosis of   By: Fernande, MD, CODY Elspeth Darner        Past Surgical History:   Procedure Laterality Date   C-EYE SURGERY PROCEDURE     30 yrs ago   Cataract extraction     CATARACT EXTRACTION Bilateral 2001   Dr. Cleatus   COLONOSCOPY  2009   Also 2002   ESOPHAGEAL DILATION     x 4   ESOPHAGEAL MANOMETRY     ESOPHAGOGASTRODUODENOSCOPY  Multiple   Exploratory laparoscopy     EYE SURGERY Bilateral    Cat Sx - Dr. Cleatus BLASE FUNDOPLICATION     TOTAL ABDOMINAL HYSTERECTOMY     FAMILY HISTORY Family History  Problem Relation Age of Onset   Multiple myeloma Father        died age 32   Heart failure Mother        died age 31   SOCIAL HISTORY Social History   Tobacco Use   Smoking status: Never   Smokeless tobacco: Never  Vaping Use   Vaping status: Never Used  Substance Use Topics   Alcohol  use: No    Alcohol /week: 0.0 standard drinks of alcohol    Drug use: No       OPHTHALMIC EXAM:  Base Eye Exam     Visual Acuity (Snellen - Linear)       Right Left   Dist [place] 20/25 -2    Dist cc 20/30 20/30 -1   Dist ph cc NI NI    Correction: Glasses         Tonometry (Tonopen, 1:04 PM)       Right Left   Pressure 13 13         Pupils       Dark Light Shape React APD   Right 3 2 Round Brisk None   Left 3 2 Round Brisk None         Visual Fields       Left Right    Full Full         Extraocular Movement       Right Left    Full, Ortho Full, Ortho         Neuro/Psych     Oriented x3: Yes   Mood/Affect: Normal         Dilation     Both eyes: 1.0% Mydriacyl, 2.5% Phenylephrine @ 1:05 PM           Slit Lamp and Fundus Exam     External Exam       Right Left   External Normal Normal         Slit Lamp Exam       Right Left   Lids/Lashes Dermatochalasis - upper lid Dermatochalasis - upper lid,  mild MGD   Conjunctiva/Sclera White and quiet White and quiet   Cornea arcus, 2+ inferior Punctate epithelial erosions, decreased TBUT, tear film debris Arcus; 1+inferior Punctate epithelial erosions, +tear  film debris, EBMD, peripheral corneal haze   Anterior Chamber deep and clear deep and clear   Iris Round, dilated Round, dilated   Lens PCIOL; open PC PCIOL; 1+ PCO ST quad approaching central visual axis   Anterior Vitreous mild syneresis, PVD, vitreous condensations Mild syneresis, PVD, vitreous condensations         Fundus Exam       Right Left   Disc mild Pallor, Sharp rim, Compact mild Pallor, Sharp rim, Compact   C/D Ratio 0.1 0.1   Macula Flat, Blunted foveal reflex, RPE mottling and clumping; drusen; no heme or edema flat, Blunted foveal reflex, RPE mottling and clumping, ERM with stable central thickening; drusen; no heme or edema   Vessels attenuated, mild tortuosity attenuated, mild tortuosity   Periphery Attached, paving stone at 0700, mild Reticular degeneration, No heme attached, No heme           Refraction     Wearing Rx       Sphere Cylinder Axis Add   Right +0.00 +0.50 149 +3.50   Left -1.75 +1.75 171 +3.50    Age: new           IMAGING AND PROCEDURES  Imaging and Procedures for 08/29/17  OCT, Retina - OU - Both Eyes       Right Eye Quality was good. Central Foveal Thickness: 351. Progression has been stable. Findings include normal foveal contour, no IRF, no SRF, retinal drusen , pigment epithelial detachment (Persistent drusen - stable from prior, blunted foveal contour).   Left Eye Quality was good. Central Foveal Thickness: 433. Progression has been stable. Findings include no IRF, no SRF, abnormal foveal contour, retinal drusen , epiretinal membrane (ERM with central retinal thickening -- stable).   Notes Images taken, stored on drive  Diagnosis / Impression:   nonexudative ARMD OU -- stable drusen; blunted foveal contour ERM OS -- mild -- stable from prior  Clinical management:  See below  Abbreviations: NFP - Normal foveal profile. CME - cystoid macular edema. PED - pigment epithelial detachment. IRF - intraretinal fluid. SRF -  subretinal fluid. EZ - ellipsoid zone. ERM - epiretinal membrane. ORA - outer retinal atrophy. ORT - outer retinal tubulation. SRHM - subretinal hyper-reflective material             ASSESSMENT/PLAN:    ICD-10-CM   1. Intermediate stage nonexudative age-related macular degeneration of both eyes  H35.3132 OCT, Retina - OU - Both Eyes    2. Epiretinal membrane (ERM) of left eye  H35.372     3. Pseudophakia of both eyes  Z96.1     4. Dry eyes  H04.123      1. Age related macular degeneration, non-exudative, both eyes  - mild-intermediate stage OU -- stable drusen on OCT  - The incidence, anatomy, and pathology of dry AMD, risk of progression, and the AREDS and AREDS 2 study including smoking risks discussed with patient.  - continue amsler grid monitoring  - recommend f/u 9 months, sooner prn -- DFE/OCT  2. Epiretinal membrane, left eye   - very mild ERM, BCVA 20/30 with no metamorphopsia OS  - OCT shows interval improvement in mild pucker nasal mac  - no surgical intervention indicated at this time  - will continue to monitor  - recommend  f/u 9 months, sooner prn -- DFE/OCT  3. Pseudophakia OU  - s/p CE/IOL OU w/ Dr. Cleatus  - beautiful surgeries, doing well  - continue to monitor  4. Dry Eyes OU  - exam shows persistent PEE (1-2+), ABMD, decreased TBUT and tear film debris  - recommend hot compresses OU BID  - recommend artificial tears and lubricating ointment as needed; patient uses Thera tears three times a day--recommend using the thicker ointment in conjunction w/ the tears.   Ophthalmic Meds Ordered this visit:  No orders of the defined types were placed in this encounter.    Return in about 9 months (around 02/26/2025) for non exu ARMD OU, DFE, OCT.  There are no Patient Instructions on file for this visit.  This document serves as a record of services personally performed by Redell JUDITHANN Hans, MD, PhD. It was created on their behalf by Wanda Keens, COT an  ophthalmic technician. The creation of this record is the provider's dictation and/or activities during the visit.    Electronically signed by:  Wanda GEANNIE Keens, COT  06/02/2024 2:06 AM  This document serves as a record of services personally performed by Redell JUDITHANN Hans, MD, PhD. It was created on their behalf by Almetta Pesa, an ophthalmic technician. The creation of this record is the provider's dictation and/or activities during the visit.    Electronically signed by: Almetta Pesa, OA, 06/02/2024  2:06 AM   Redell JUDITHANN Hans, M.D., Ph.D. Diseases & Surgery of the Retina and Vitreous [facility]  I have reviewed the above documentation for accuracy and completeness, and I agree with the above. Redell JUDITHANN Hans, M.D., Ph.D. 06/02/2024 2:11 AM   Abbreviations: M myopia (nearsighted); A astigmatism; H hyperopia (farsighted); P presbyopia; Mrx spectacle prescription;  CTL contact lenses; OD right eye; OS left eye; OU both eyes  XT exotropia; ET esotropia; PEK punctate epithelial keratitis; PEE punctate epithelial erosions; DES dry eye syndrome; MGD meibomian gland dysfunction; ATs artificial tears; PFAT's preservative free artificial tears; NSC nuclear sclerotic cataract; PSC posterior subcapsular cataract; ERM epi-retinal membrane; PVD posterior vitreous detachment; RD retinal detachment; DM diabetes mellitus; DR diabetic retinopathy; NPDR non-proliferative diabetic retinopathy; PDR proliferative diabetic retinopathy; CSME clinically significant macular edema; DME diabetic macular edema; dbh dot blot hemorrhages; CWS cotton wool spot; POAG primary open angle glaucoma; C/D cup-to-disc ratio; HVF humphrey visual field; GVF goldmann visual field; OCT optical coherence tomography; IOP intraocular pressure; BRVO Branch retinal vein occlusion; CRVO central retinal vein occlusion; CRAO central retinal artery occlusion; BRAO branch retinal artery occlusion; RT retinal tear; SB  scleral buckle; PPV pars plana vitrectomy; VH Vitreous hemorrhage; PRP panretinal laser photocoagulation; IVK intravitreal kenalog; VMT vitreomacular traction; MH Macular hole;  NVD neovascularization of the disc; NVE neovascularization elsewhere; AREDS age related eye disease study; ARMD age related macular degeneration; POAG primary open angle glaucoma; EBMD epithelial/anterior basement membrane dystrophy; ACIOL anterior chamber intraocular lens; IOL intraocular lens; PCIOL posterior chamber intraocular lens; Phaco/IOL phacoemulsification with intraocular lens placement; PRK photorefractive keratectomy; LASIK laser assisted in situ keratomileusis; HTN hypertension; DM diabetes mellitus; COPD chronic obstructive pulmonary disease

## 2024-05-21 DIAGNOSIS — M170 Bilateral primary osteoarthritis of knee: Secondary | ICD-10-CM | POA: Diagnosis not present

## 2024-05-28 ENCOUNTER — Encounter (INDEPENDENT_AMBULATORY_CARE_PROVIDER_SITE_OTHER): Payer: Self-pay | Admitting: Ophthalmology

## 2024-05-28 ENCOUNTER — Ambulatory Visit (INDEPENDENT_AMBULATORY_CARE_PROVIDER_SITE_OTHER): Admitting: Ophthalmology

## 2024-05-28 ENCOUNTER — Telehealth: Payer: Self-pay | Admitting: Cardiology

## 2024-05-28 DIAGNOSIS — H04123 Dry eye syndrome of bilateral lacrimal glands: Secondary | ICD-10-CM

## 2024-05-28 DIAGNOSIS — H35372 Puckering of macula, left eye: Secondary | ICD-10-CM

## 2024-05-28 DIAGNOSIS — Z961 Presence of intraocular lens: Secondary | ICD-10-CM

## 2024-05-28 DIAGNOSIS — H353132 Nonexudative age-related macular degeneration, bilateral, intermediate dry stage: Secondary | ICD-10-CM

## 2024-05-28 NOTE — Telephone Encounter (Signed)
 Spoke to patient she stated since she has been taking Metoprolol  her hair is falling out and she is tired.She wanted to ask Dr.Hochrein if she could stop taking the morning dose or could she stop altogether.Message sent to Dr.Hochrein for advice.

## 2024-05-28 NOTE — Telephone Encounter (Signed)
 Pt c/o medication issue:  1. Name of Medication: metoprolol  tartrate (LOPRESSOR ) 25 MG tablet   2. How are you currently taking this medication (dosage and times per day)? Take 25 mg by mouth 2 (two) times daily.   3. Are you having a reaction (difficulty breathing--STAT)? No   4. What is your medication issue? Pt states she has been very tired and experiencing hair loss. She believes metoprolol  may be related to symptoms. Please advise.

## 2024-05-30 NOTE — Telephone Encounter (Signed)
 Spoke with pt regarding Dr. Denver suggestions. Pt stated she is unsure of stopping Metoprolol  as she knows she will have palpitations. Pt has decided to wait to make any medications changes until she sees Dr. Lavona in January. The pt provided blood pressure readings below and is concerned with some of them being elevated. Pt also stated she is taking 25 mg of Metoprolol  in the AM and 50 mg in the PM. Pt was told the information provided would be forwarded to Dr. Lavona for his suggestions. Pt verbalized understanding. All questions if any were answered.  12/11 8:15 am 147/85 HR 80  4 pm 114/55 HR 74 12/10 8:25 am 182/100 HR 76  9:40 am 164/87 HR 66  2:15 pm 123/85 HR 83  4:30 pm 136/72 HR 74 12/3 183/94 HR 79  3:45 126/63 HR 79 11/24 8:25 am 167/93 HR 68  4:30 pm 132/65 HR 60 11/23 7:50 am 162/87 HR 80  5 pm 130/67 HR 69  6:20 pm 114/51 HR 76

## 2024-05-30 NOTE — Telephone Encounter (Signed)
 Left voice message to call back 12/11

## 2024-06-01 DIAGNOSIS — Z1231 Encounter for screening mammogram for malignant neoplasm of breast: Secondary | ICD-10-CM | POA: Diagnosis not present

## 2024-06-02 ENCOUNTER — Encounter (INDEPENDENT_AMBULATORY_CARE_PROVIDER_SITE_OTHER): Payer: Self-pay | Admitting: Ophthalmology

## 2024-06-02 NOTE — Progress Notes (Unsigned)
 [facility]  PATIENT: Michelle Keith DOB: 02-09-1932  REFERRING DOCTOR OR PCP:  Dr. Bonner ([facility]); Dr. Shayne (PCP) SOURCE: patient, notes from Dr. Shayne,   _________________________________   HISTORICAL  CHIEF COMPLAINT:  Chief Complaint  Patient presents with   New Patient (Initial Visit)    RM10, DAUGHTER PRESENT, NEW PT REFERRAL FROM [facility].Charlie Bonner MD [phone]/peripheral polyneuropathy    HISTORY OF PRESENT ILLNESS:  I had the pleasure of seeing your patient, Michelle Keith, at [facility] for neurologic consultation regarding her left leg pain and numbness  She is a 89 year old woman who has had progressive numbness in her left leg. She noted that stepping over a curb was difficult with her left leg about 2 years ago and then more numbness over past few months.  She has severe pain in the left hip region but only mild pain elsewhere in the leg..  The right leg is near baseline with just occasional tingling.   The numbness in the left leg goes up to her knee.   She sometimes nots more symptoms if walking.     She had one fall 9 months ago but she notes that she tripped.  Se uses grab bars in her shower.   She lives alone.   She broke her left ankle 6 years ago.     She has had scoliosis since childhood and has chronic LBP, more recently in the left hip more than elsewhere.      She has a long history of RLS that generally improved with walking but now is more persistent.  This is keeping her awake at night.     She has had some urge incontinence the past year.    She had an ESI by Dr. Bonner a couple weeks ago and left hip pain is better since.     Vascular study 04/21/2024 showed no DVT (is on Eliquis  for DVT earlier in year).    TSH was normal May [id number].  MRI has shown a cavernoma in the brain but no strokes   Imaging: MRI of the brain 11/02/2023 showed normal brain volume for age.  There are scattered T2/FLAIR  hyperintense foci in the cerebral hemispheres consistent with chronic micro vascular ischemic change.  A 8 mm focus of susceptibility is noted in the deep right frontal lobe.  This could represent a cavernoma.  She reports that she has had a MRI of the lumbar spine but unfortunately we do not have any access to the images or report   MRI cervical spine 3/4'2022 showed spinal stenosis at [facility] and C6C7.    REVIEW OF SYSTEMS: Constitutional: No fevers, chills, sweats, or change in appetite Eyes: No visual changes, double vision, eye pain Ear, nose and throat: No hearing loss, ear pain, nasal congestion, sore throat Cardiovascular: No chest pain, palpitations Respiratory:  No shortness of breath at rest or with exertion.   No wheezes GastrointestinaI: No nausea, vomiting, diarrhea, abdominal pain, fecal incontinence Genitourinary:  No dysuria, urinary retention or frequency.  No nocturia. Musculoskeletal:  No neck pain, back pain Integumentary: No rash, pruritus, skin lesions Neurological: as above Psychiatric: No depression at this time.  No anxiety Endocrine: No palpitations, diaphoresis, change in appetite, change in weigh or increased thirst Hematologic/Lymphatic:  No anemia, purpura, petechiae. Allergic/Immunologic: No itchy/runny eyes, nasal congestion, recent allergic reactions, rashes  ALLERGIES: Allergies[1]  HOME MEDICATIONS: Current Medications[2]  PAST MEDICAL HISTORY: Past Medical History:  Diagnosis Date  Dysautonomia (HCC)    Esophageal stricture    GERD (gastroesophageal reflux disease)    Hiatal hernia    Hypertension    IBS (irritable bowel syndrome)    Ileus (HCC) 11/25/2016   Macular degeneration    Dry OU   Osteopenia    Osteoporosis    Right thyroid  nodule    Squamous cell skin cancer    Syncope    SYNCOPE 07/10/2009   Qualifier: Diagnosis of   By: Fernande, MD, CODY Elspeth Darner         PAST SURGICAL HISTORY: Past Surgical History:  Procedure  Laterality Date   C-EYE SURGERY PROCEDURE     30 yrs ago   Cataract extraction     CATARACT EXTRACTION Bilateral 2001   Dr. Cleatus   COLONOSCOPY  2009   Also 2002   ESOPHAGEAL DILATION     x 4   ESOPHAGEAL MANOMETRY     ESOPHAGOGASTRODUODENOSCOPY  Multiple   Exploratory laparoscopy     EYE SURGERY Bilateral    Cat Sx - Dr. Cleatus BLASE FUNDOPLICATION     TOTAL ABDOMINAL HYSTERECTOMY      FAMILY HISTORY: Family History  Problem Relation Age of Onset   Multiple myeloma Father        died age 88   Heart failure Mother        died age 88    SOCIAL HISTORY: Social History   Socioeconomic History   Marital status: Widowed    Spouse name: Not on file   Number of children: 2   Years of education: Not on file   Highest education level: Not on file  Occupational History   Occupation: Retired  Tobacco Use   Smoking status: Never   Smokeless tobacco: Never  Vaping Use   Vaping status: Never Used  Substance and Sexual Activity   Alcohol  use: No    Alcohol /week: 0.0 standard drinks of alcohol    Drug use: No   Sexual activity: Not Currently    Partners: Male    Comment: WIDOWED  Other Topics Concern   Not on file  Social History Narrative   Lives alone.  Husband in a nursing home after a stroke.        1 son one daughter   Social Drivers of Health   Tobacco Use: Low Risk (06/04/2024)   Patient History    Smoking Tobacco Use: Never    Smokeless Tobacco Use: Never    Passive Exposure: Not on file  Financial Resource Strain: Not on file  Food Insecurity: Not on file  Transportation Needs: Not on file  Physical Activity: Not on file  Stress: Not on file  Social Connections: Not on file  Intimate Partner Violence: Not on file  Depression (PHQ2-9): Low Risk (02/06/2024)   Depression (PHQ2-9)    PHQ-2 Score: 0  Alcohol  Screen: Not on file  Housing: Not on file  Utilities: Not on file  Health Literacy: Not on file       PHYSICAL EXAM  Vitals:    06/04/24 1346  BP: 132/78  Pulse: 68  SpO2: 95%  Height: 5' 5 (1.651 m)    Body mass index is 20.02 kg/m.   General: The patient is well-developed and well-nourished and in no acute distress  HEENT:  Head is [place]/AT.  Sclera are anicteric.  Funduscopic exam shows normal optic discs and retinal vessels.  Neck: No carotid bruits are noted.  The neck is nontender.  Cardiovascular: The heart  has a regular rate and rhythm with a normal S1 and S2. There were no murmurs, gallops or rubs.    Skin: Extremities are without rash or  edema.  Musculoskeletal:  Back is nontender  Neurologic Exam  Mental status: The patient is alert and oriented x 3 at the time of the examination. The patient has apparent normal recent and remote memory, with an apparently normal attention span and concentration ability.   Speech is normal.  Cranial nerves: Extraocular movements are full. Pupils are equal, round, and reactive to light and accomodation.  Visual fields are full.  Facial symmetry is present. There is good facial sensation to soft touch bilaterally.Facial strength is normal.  Trapezius and sternocleidomastoid strength is normal. No dysarthria is noted.  The tongue is midline, and the patient has symmetric elevation of the soft palate. No obvious hearing deficits are noted.  Motor:  Muscle bulk is normal.   Tone is normal. Strength is 4+/5 triceps and right biceps and deltoid.   Frip is 5/5.  Right leg is 5/5 strength Left leg is 4/5 in iliopsoas, 4+ quads.    Sensory: Sensory testing is intact to pinprick, soft touch and vibration sensation in all 4 extremities.  Coordination: Cerebellar testing reveals good finger-nose-finger and heel-to-shin bilaterally.  Gait and station: Station is normal.   Gait shows very mild left foot drop but stride is normal for age.  Romberg is negative.   Reflexes: Deep tendon reflexes are symmetric and normal bilaterally.   Plantar responses are  flexor.    DIAGNOSTIC DATA (LABS, IMAGING, TESTING) - I reviewed patient records, labs, notes, testing and imaging myself where available.  Lab Results  Component Value Date   WBC 7.0 02/06/2024   HGB 11.4 (L) 02/06/2024   HCT 34.7 (L) 02/06/2024   MCV 93.5 02/06/2024   PLT 255 02/06/2024      Component Value Date/Time   NA 132 (L) 02/06/2024 1037   K 5.5 (H) 02/06/2024 1037   CL 99 02/06/2024 1037   CO2 25 02/06/2024 1037   GLUCOSE 89 02/06/2024 1037   BUN 14 02/06/2024 1037   CREATININE 0.93 02/06/2024 1037   CREATININE 0.74 01/13/2016 1440   CALCIUM 9.4 02/06/2024 1037   PROT 6.4 (L) 02/06/2024 1037   ALBUMIN 4.1 02/06/2024 1037   AST 21 02/06/2024 1037   ALT 15 02/06/2024 1037   ALKPHOS 56 02/06/2024 1037   BILITOT 0.4 02/06/2024 1037   GFRNONAA 59 (L) 02/06/2024 1037   GFRAA >60 11/27/2016 0506   No results found for: CHOL, HDL, LDLCALC, LDLDIRECT, TRIG, CHOLHDL No results found for: YHAJ8R No results found for: VITAMINB12 Lab Results  Component Value Date   TSH 3.150 10/26/2023       ASSESSMENT AND PLAN  Numbness - Plan: Multiple Myeloma Panel (SPEP&IFE w/QIG)  Restless leg - Plan: Ferritin  Left leg pain  In summary, Michelle Keith is a 89 year old woman who has had left leg weakness and numbness that has worsened over the last few months.  Additionally she reports pain in the left hip region.  She reportedly has lumbar degenerative changes but we have no access to the imaging studies or the report Her worst symptoms are likely related to lumbar radiculopathy.  Unfortunately, we were not sent the MRI images or the lumbar spine report She appears to have a very mild superimposed polyneuropathy and I will check SPEP/IEF. She also has restless leg syndrome and we will check a ferritin commend supplementing iron if  low. I personally reviewed the MRI of the brain.  The chronic hemorrhage in the right hemisphere is most likely a cerebral cavernous  angioma (cavernoma).  These are benign and do not require any treatment No follow-up evaluation is needed but we will let her know the results of the laboratory test and they can call for significant new or worsening symptoms    Lyrik Dockstader A. Vear, MD, [facility] 06/04/2024, 5:26 PM Certified in Neurology, Clinical Neurophysiology, Sleep Medicine and Neuroimaging  [facility] [address], [address] [place], [place] [address] [phone]     [1]  Allergies Allergen Reactions   Epinephrine Anaphylaxis    Other Reaction(s): tachycardia   Lidocaine Anaphylaxis    Other Reaction(s): Weakness   Amlodipine  Other (See Comments)    headaches  Other Reaction(s): Not available  amlodipine   headaches    amlodipine    Asparagus Other (See Comments)    Other Reaction(s): Not available   Aspirin Other (See Comments)    Other Reaction(s): diarrhea   Atenolol     Other Reaction(s): pass out   Cholestyramine Other (See Comments)    Other Reaction(s): Not available  Questran   Clarithromycin Dermatitis   Codeine Other (See Comments)    Other Reaction(s): Not available   Cortisone Other (See Comments)   Diclofenac Sodium Other (See Comments)    Other Reaction(s): other   Diltiazem  Other (See Comments)   Estrogens Other (See Comments)   Hydrocodone Nausea And Vomiting and Other (See Comments)    Other Reaction(s): Fatigue, Not available  hydrocodone   Omeprazole     Other Reaction(s): tachycardia   Omeprazole Magnesium Other (See Comments)    Ask at office visit  omeprazole magnesium   Penicillins    Pindolol  Diarrhea    Fatigue, hip pain  pindolol    Polyethylene Glycol Other (See Comments)    Other Reaction(s): heart palpitations, face flushing  polyethylene glycols   Procaine Other (See Comments)   Pseudoephedrine-Guaifenesin Other (See Comments)   Rabeprazole Other (See Comments)    Other Reaction(s): Not available   Raloxifene  Other (See Comments)     Other Reaction(s): Not available  Other Reaction(s): HAs, worse GERD   Sulfasalazine Other (See Comments)   Sulfonamide Derivatives Other (See Comments)    Other Reaction(s): unknown  sulfamethoxazole   Tetracycline Hives   Xifaxan  [Rifaximin ]    Glycol Palpitations   Glycol Stearate Palpitations   Tramadol  Palpitations   Valsartan Palpitations  [2]  Current Outpatient Medications:    apixaban  (ELIQUIS ) 2.5 MG TABS tablet, Take 1 tablet (2.5 mg total) by mouth 2 (two) times daily. (Patient taking differently: Take 1.25 mg by mouth 2 (two) times daily.), Disp: , Rfl:    Calcium-Vitamin D -Vitamin K (VIACTIV PO), Take 1 tablet by mouth 2 (two) times daily., Disp: , Rfl:    Cholecalciferol  (VITAMIN D ) 50 MCG (2000 UT) tablet, Take 2,000 Units by mouth daily., Disp: , Rfl:    denosumab  (PROLIA ) 60 MG/ML SOSY injection, Inject 60 mg into the skin once. 2 times a year, Disp: , Rfl:    metoprolol  tartrate (LOPRESSOR ) 25 MG tablet, Take 1 tablet (25 mg total) by mouth 2 (two) times daily., Disp: 60 tablet, Rfl: 5   metoprolol  tartrate (LOPRESSOR ) 25 MG tablet, Take 25 mg by mouth 2 (two) times daily., Disp: , Rfl:    pantoprazole  (PROTONIX ) 40 MG tablet, Take 40 mg by mouth every other day. Patient states she takes it every other day prn, Disp: ,  Rfl:    rOPINIRole  (REQUIP ) 0.5 MG tablet, One po qPM and one po qHS, Disp: 60 tablet, Rfl: 5   valACYclovir (VALTREX) 500 MG tablet, Take 500 mg by mouth as needed (fever blisters)., Disp: , Rfl:    zolpidem  (AMBIEN ) 10 MG tablet, Take 2.5 mg by mouth daily as needed for sleep. , Disp: , Rfl:

## 2024-06-04 ENCOUNTER — Ambulatory Visit: Admitting: Neurology

## 2024-06-04 ENCOUNTER — Encounter: Payer: Self-pay | Admitting: Neurology

## 2024-06-04 ENCOUNTER — Ambulatory Visit: Admitting: Podiatry

## 2024-06-04 ENCOUNTER — Encounter: Payer: Self-pay | Admitting: Podiatry

## 2024-06-04 VITALS — BP 132/78 | HR 68 | Ht 65.0 in

## 2024-06-04 DIAGNOSIS — M79675 Pain in left toe(s): Secondary | ICD-10-CM | POA: Diagnosis not present

## 2024-06-04 DIAGNOSIS — B351 Tinea unguium: Secondary | ICD-10-CM | POA: Diagnosis not present

## 2024-06-04 DIAGNOSIS — R200 Anesthesia of skin: Secondary | ICD-10-CM

## 2024-06-04 DIAGNOSIS — M79674 Pain in right toe(s): Secondary | ICD-10-CM | POA: Diagnosis not present

## 2024-06-04 DIAGNOSIS — M79605 Pain in left leg: Secondary | ICD-10-CM | POA: Diagnosis not present

## 2024-06-04 DIAGNOSIS — Z7901 Long term (current) use of anticoagulants: Secondary | ICD-10-CM | POA: Diagnosis not present

## 2024-06-04 DIAGNOSIS — G2581 Restless legs syndrome: Secondary | ICD-10-CM | POA: Diagnosis not present

## 2024-06-04 MED ORDER — ROPINIROLE HCL 0.5 MG PO TABS: ORAL_TABLET | ORAL | 5 refills | Status: AC

## 2024-06-04 NOTE — Progress Notes (Signed)
 Subjective: Chief Complaint  Patient presents with   RFC    Rm12 Routine foot care     89 year old female presents for the above concerns.  States that she has been doing well except her nails have again become thick, elongated she has difficulty trimming them herself.  No open lesions or any changes otherwise.   She is on blood thinners for the recent DVT.   Objective: AAO x3, NAD DP/PT pulses palpable bilaterally, CRT less than 3 seconds Nails are hypertrophic, dystrophic, brittle, discolored, elongated 10. No surrounding redness or drainage. Tenderness nails 1-5 bilaterally. No open lesions or pre-ulcerative lesions are identified today. No pain with calf compression, erythema or warmth.   Assessment: Symptomatic onychomycosis, chronic anticoagulation   Plan: Symptomatic onychomycosis -Sharply debrided nails x 10 without any complications or bleeding.  She is continue with tea tree oil to the nail.  -Moisturizer for the feet daily.  -Daily foot inspection.   Return in about 3 months (around 09/02/2024).  Michelle Keith DPM

## 2024-06-04 NOTE — Patient Instructions (Signed)
 Take ropinirole  0.5 mg: One pill at dinner and one pill at bedtime

## 2024-06-07 LAB — MULTIPLE MYELOMA PANEL, SERUM
Albumin SerPl Elph-Mcnc: 3.5 g/dL (ref 2.9–4.4)
Albumin/Glob SerPl: 1.3 (ref 0.7–1.7)
Alpha 1: 0.3 g/dL (ref 0.0–0.4)
Alpha2 Glob SerPl Elph-Mcnc: 0.7 g/dL (ref 0.4–1.0)
B-Globulin SerPl Elph-Mcnc: 0.9 g/dL (ref 0.7–1.3)
Gamma Glob SerPl Elph-Mcnc: 0.8 g/dL (ref 0.4–1.8)
Globulin, Total: 2.7 g/dL (ref 2.2–3.9)
IgG (Immunoglobin G), Serum: 798 mg/dL (ref 586–1602)
IgM (Immunoglobulin M), Srm: 134 mg/dL (ref 26–217)
Immunoglobulin A, (IgA) QN, Serum: 79 mg/dL (ref 64–422)
M Protein SerPl Elph-Mcnc: 0.3 g/dL — AB
Total Protein: 6.2 g/dL (ref 6.0–8.5)

## 2024-06-07 LAB — FERRITIN: Ferritin: 52 ng/mL (ref 15–150)

## 2024-06-16 ENCOUNTER — Ambulatory Visit: Payer: Self-pay | Admitting: Neurology

## 2024-06-17 NOTE — Telephone Encounter (Signed)
 Pt called  to request call back to go over results  Pt is also requesting for Results to be mailed to her .

## 2024-06-19 ENCOUNTER — Telehealth: Payer: Self-pay | Admitting: Neurology

## 2024-06-19 NOTE — Telephone Encounter (Signed)
 Sent request to [facility] for MRI lumbar imaging disc copy

## 2024-07-01 ENCOUNTER — Other Ambulatory Visit: Payer: Self-pay

## 2024-07-01 DIAGNOSIS — K589 Irritable bowel syndrome without diarrhea: Secondary | ICD-10-CM | POA: Insufficient documentation

## 2024-07-01 DIAGNOSIS — G901 Familial dysautonomia [Riley-Day]: Secondary | ICD-10-CM | POA: Insufficient documentation

## 2024-07-01 DIAGNOSIS — R8290 Unspecified abnormal findings in urine: Secondary | ICD-10-CM

## 2024-07-01 DIAGNOSIS — M810 Age-related osteoporosis without current pathological fracture: Secondary | ICD-10-CM | POA: Insufficient documentation

## 2024-07-01 DIAGNOSIS — H3530 Unspecified macular degeneration: Secondary | ICD-10-CM | POA: Insufficient documentation

## 2024-07-01 DIAGNOSIS — H6120 Impacted cerumen, unspecified ear: Secondary | ICD-10-CM | POA: Insufficient documentation

## 2024-07-01 DIAGNOSIS — K222 Esophageal obstruction: Secondary | ICD-10-CM | POA: Insufficient documentation

## 2024-07-01 DIAGNOSIS — K219 Gastro-esophageal reflux disease without esophagitis: Secondary | ICD-10-CM | POA: Insufficient documentation

## 2024-07-01 DIAGNOSIS — K449 Diaphragmatic hernia without obstruction or gangrene: Secondary | ICD-10-CM | POA: Insufficient documentation

## 2024-07-01 DIAGNOSIS — M8580 Other specified disorders of bone density and structure, unspecified site: Secondary | ICD-10-CM | POA: Insufficient documentation

## 2024-07-01 DIAGNOSIS — E041 Nontoxic single thyroid nodule: Secondary | ICD-10-CM | POA: Insufficient documentation

## 2024-07-01 DIAGNOSIS — C4492 Squamous cell carcinoma of skin, unspecified: Secondary | ICD-10-CM | POA: Insufficient documentation

## 2024-07-01 HISTORY — DX: Unspecified abnormal findings in urine: R82.90

## 2024-07-01 HISTORY — DX: Impacted cerumen, unspecified ear: H61.20

## 2024-07-11 ENCOUNTER — Other Ambulatory Visit: Payer: Self-pay | Admitting: General Surgery

## 2024-07-11 ENCOUNTER — Ambulatory Visit: Admitting: Cardiology

## 2024-07-11 DIAGNOSIS — R222 Localized swelling, mass and lump, trunk: Secondary | ICD-10-CM

## 2024-07-16 ENCOUNTER — Encounter (INDEPENDENT_AMBULATORY_CARE_PROVIDER_SITE_OTHER): Admitting: Ophthalmology

## 2024-07-19 ENCOUNTER — Ambulatory Visit
Admission: RE | Admit: 2024-07-19 | Discharge: 2024-07-19 | Disposition: A | Source: Ambulatory Visit | Attending: General Surgery | Admitting: General Surgery

## 2024-07-19 DIAGNOSIS — R222 Localized swelling, mass and lump, trunk: Secondary | ICD-10-CM

## 2024-07-25 ENCOUNTER — Other Ambulatory Visit

## 2024-07-30 ENCOUNTER — Ambulatory Visit: Admitting: Cardiology

## 2024-08-08 ENCOUNTER — Ambulatory Visit: Admitting: Cardiology

## 2024-10-01 ENCOUNTER — Ambulatory Visit: Admitting: Podiatry

## 2024-10-22 ENCOUNTER — Encounter (HOSPITAL_COMMUNITY)

## 2025-02-25 ENCOUNTER — Encounter (INDEPENDENT_AMBULATORY_CARE_PROVIDER_SITE_OTHER): Admitting: Ophthalmology
